# Patient Record
Sex: Female | Born: 1985
Health system: Southern US, Community
[De-identification: ages and names within clinical notes are randomized; demographics above are authoritative.]

## PROBLEM LIST (undated history)

## (undated) DIAGNOSIS — T7840XA Allergy, unspecified, initial encounter: Secondary | ICD-10-CM

## (undated) DIAGNOSIS — F419 Anxiety disorder, unspecified: Secondary | ICD-10-CM

## (undated) DIAGNOSIS — F32A Depression, unspecified: Secondary | ICD-10-CM

## (undated) DIAGNOSIS — R51 Headache: Secondary | ICD-10-CM

## (undated) DIAGNOSIS — E559 Vitamin D deficiency, unspecified: Secondary | ICD-10-CM

## (undated) DIAGNOSIS — R519 Headache, unspecified: Secondary | ICD-10-CM

## (undated) DIAGNOSIS — M722 Plantar fascial fibromatosis: Secondary | ICD-10-CM

## (undated) DIAGNOSIS — Z9109 Other allergy status, other than to drugs and biological substances: Secondary | ICD-10-CM

## (undated) DIAGNOSIS — D249 Benign neoplasm of unspecified breast: Secondary | ICD-10-CM

## (undated) DIAGNOSIS — F329 Major depressive disorder, single episode, unspecified: Secondary | ICD-10-CM

## (undated) DIAGNOSIS — F909 Attention-deficit hyperactivity disorder, unspecified type: Secondary | ICD-10-CM

## (undated) HISTORY — DX: Headache: R51

## (undated) HISTORY — DX: Allergy, unspecified, initial encounter: T78.40XA

## (undated) HISTORY — PX: BUNIONECTOMY: SHX129

## (undated) HISTORY — DX: Plantar fascial fibromatosis: M72.2

## (undated) HISTORY — DX: Anxiety disorder, unspecified: F41.9

## (undated) HISTORY — PX: OTHER SURGICAL HISTORY: SHX169

## (undated) HISTORY — PX: WISDOM TOOTH EXTRACTION: SHX21

## (undated) HISTORY — DX: Benign neoplasm of unspecified breast: D24.9

## (undated) HISTORY — DX: Other allergy status, other than to drugs and biological substances: Z91.09

## (undated) HISTORY — DX: Attention-deficit hyperactivity disorder, unspecified type: F90.9

## (undated) HISTORY — DX: Major depressive disorder, single episode, unspecified: F32.9

## (undated) HISTORY — DX: Headache, unspecified: R51.9

## (undated) HISTORY — DX: Vitamin D deficiency, unspecified: E55.9

## (undated) HISTORY — PX: FOOT SURGERY: SHX648

## (undated) HISTORY — PX: TONSILLECTOMY: SUR1361

## (undated) HISTORY — DX: Depression, unspecified: F32.A

---
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
## 2004-01-21 ENCOUNTER — Other Ambulatory Visit: Admission: RE | Admit: 2004-01-21 | Discharge: 2004-01-21 | Payer: Self-pay | Admitting: *Deleted

## 2010-07-01 ENCOUNTER — Other Ambulatory Visit: Payer: Self-pay

## 2011-03-16 ENCOUNTER — Ambulatory Visit: Payer: Managed Care, Other (non HMO) | Admitting: Internal Medicine

## 2011-03-16 VITALS — BP 119/76 | HR 79 | Temp 98.6°F | Resp 16 | Ht 66.25 in | Wt 188.4 lb

## 2011-03-16 DIAGNOSIS — M722 Plantar fascial fibromatosis: Secondary | ICD-10-CM

## 2011-03-16 DIAGNOSIS — J029 Acute pharyngitis, unspecified: Secondary | ICD-10-CM

## 2011-03-16 DIAGNOSIS — R05 Cough: Secondary | ICD-10-CM

## 2011-03-16 DIAGNOSIS — J40 Bronchitis, not specified as acute or chronic: Secondary | ICD-10-CM

## 2011-03-16 DIAGNOSIS — R059 Cough, unspecified: Secondary | ICD-10-CM

## 2011-03-16 MED ORDER — AZITHROMYCIN 500 MG PO TABS: 500.0000 mg | ORAL_TABLET | Freq: Every day | ORAL | Status: AC

## 2011-03-16 NOTE — Progress Notes (Signed)
  Subjective:    Patient ID: Summer Shields, female    DOB: 08-23-1985, 26 y.o.   MRN: [id number]  HPI Pain in the arch of the right foot for 2 week/ achy with ambulation/. No trauma/pain 5/10 in severity Also cough sosre throat for 3 days. No temp had fatigue..   Review of Systems  Constitutional: Positive for fatigue.  HENT: Positive for sore throat.   Eyes: Negative.   Respiratory: Positive for cough.   Cardiovascular: Negative.   Gastrointestinal: Negative.   Genitourinary: Negative.   Musculoskeletal: Positive for gait problem.       Foot pain r sole of foot in the arch  Neurological: Negative.   Hematological: Negative.   Psychiatric/Behavioral: Negative.   All other systems reviewed and are negative.       Objective:   Physical Exam  Nursing note and vitals reviewed. Constitutional: She is oriented to person, place, and time. She appears well-developed and well-nourished.  HENT:  Head: Normocephalic and atraumatic.  Right Ear: External ear normal.  Left Ear: External ear normal.  Eyes: Conjunctivae and EOM are normal. Pupils are equal, round, and reactive to light.  Neck: Normal range of motion. Neck supple.  Cardiovascular: Normal rate, regular rhythm and normal heart sounds.   Pulmonary/Chest:       Cough coarse breath sounds  Abdominal: Soft. Bowel sounds are normal.  Musculoskeletal: She exhibits tenderness.       r foot arch of foot tender to touch no pain over the metatarsals  Neurological: She is alert and oriented to person, place, and time.  Skin: Skin is warm and dry.  Psychiatric: She has a normal mood and affect. Her behavior is normal. Judgment and thought content normal.          Assessment & Plan:  bronchitis pharyngitis, right foot pain /plantar fasciitis suspected.

## 2011-03-16 NOTE — Patient Instructions (Signed)
zithromax as directed. Ibuprofen as directed. Stretching exercises. Arch support. Cold compresses to sole of the foot.

## 2011-03-17 ENCOUNTER — Other Ambulatory Visit: Payer: Self-pay | Admitting: Physician Assistant

## 2011-03-17 MED ORDER — NORGESTIMATE-ETH ESTRADIOL 0.25-35 MG-MCG PO TABS: 1.0000 | ORAL_TABLET | Freq: Every day | ORAL | Status: DC

## 2011-04-11 ENCOUNTER — Ambulatory Visit: Payer: Managed Care, Other (non HMO)

## 2011-04-11 ENCOUNTER — Ambulatory Visit (INDEPENDENT_AMBULATORY_CARE_PROVIDER_SITE_OTHER): Payer: Managed Care, Other (non HMO) | Admitting: Internal Medicine

## 2011-04-11 VITALS — BP 104/70 | HR 90 | Temp 98.5°F | Resp 16 | Ht 66.25 in | Wt 190.0 lb

## 2011-04-11 DIAGNOSIS — M722 Plantar fascial fibromatosis: Secondary | ICD-10-CM

## 2011-04-11 DIAGNOSIS — Z3200 Encounter for pregnancy test, result unknown: Secondary | ICD-10-CM

## 2011-04-11 MED ORDER — MELOXICAM 15 MG PO TABS: 15.0000 mg | ORAL_TABLET | Freq: Every day | ORAL | Status: DC

## 2011-04-11 NOTE — Progress Notes (Signed)
  Subjective:    Patient ID: Summer Shields, female    DOB: Aug 01, 1985, 27 y.o.   MRN: [id number]  HPIIncreasing pain on the plantar aspect of the right foot over the past 3 months/it began as she was walking during her lunch break through months ago and has steadily gotten worse/C. Last visit-conservative measures have not helped She has some early morning pain but is actually worse the longer she is on her feet/there's no swelling and there's been no injury    Review of Systems     Objective:   Physical Exam Vital signs are stable She is exquisitely tender in the plantar Fascia adjacent to the heel in the midline Achilles nontender and not swollen She has full  dorsiflexion without pain The ankle is stable to stress      [facility] reading (PRIMARY) by  Dr. Merla Riches: Heel is normal. Foot is normal.    Assessment & Plan:  Problem #1 Plantar fasciitis Because the position of the discomfort and her symptoms of not having first morning pain aren't consistent with this diagnosis completely, hour for her to the [facility] so they can ultrasound this area and rule out a tear in the tendon and perhaps consider nitroglycerin as a treatment In the meantime she will continue her running shoes plus orthotics prescribed by Dr. Mindi Junker and I'll add Mobic daily plus ice daily

## 2011-04-20 ENCOUNTER — Ambulatory Visit (INDEPENDENT_AMBULATORY_CARE_PROVIDER_SITE_OTHER): Payer: Self-pay | Admitting: Family Medicine

## 2011-04-20 VITALS — BP 116/80 | Ht 67.0 in | Wt 190.0 lb

## 2011-04-20 DIAGNOSIS — M722 Plantar fascial fibromatosis: Secondary | ICD-10-CM | POA: Insufficient documentation

## 2011-04-20 NOTE — Patient Instructions (Signed)
Plantar fasciitis patient education handout given with home exercises.

## 2011-04-20 NOTE — Assessment & Plan Note (Addendum)
Will do a trial of sports insoles with scaphoid support +/- arch straps.  She has the arch straps.  She will try the insoles with and without and choose based on comfort.  She will continue the meloxicam plus ice and stretching.

## 2011-04-20 NOTE — Progress Notes (Signed)
  Subjective:    Patient ID: Summer Shields, female    DOB: November 20, 1985, 26 y.o.   MRN: [id number]  HPI 26 y/o female is here with right heel pain x3 months that is thought to be plantar fasciitis.  However, she was referred to [facility] for ultrasound because her symptom history is atypical.  She has more pain with prolonged walking as opposed to early am.  There is no history of trauma.  She has never had this problem in the past.  She has tried OTC inserts without improvement.  She was prescribed mobic which has helped some.     Review of Systems     Objective:   Physical Exam  Feet: Some loss of the longitudinal arch, more on the unaffected foot Bilateral loss of the transverse arch with splaying between the 1st and 2nd digits Tender to palpation at the insertion of the plantar fascia bilateral, right greater left No swelling or erythema  Posterior tib function is normal  Ultrasound: The plantar fascia measures 0.44cm, it appears intact.  There is a small amount of spurring noted on the calcaneus.       Assessment & Plan:

## 2011-05-18 ENCOUNTER — Encounter: Payer: Self-pay | Admitting: Family Medicine

## 2011-05-18 ENCOUNTER — Ambulatory Visit (INDEPENDENT_AMBULATORY_CARE_PROVIDER_SITE_OTHER): Payer: Managed Care, Other (non HMO) | Admitting: Family Medicine

## 2011-05-18 VITALS — BP 127/83 | HR 81

## 2011-05-18 DIAGNOSIS — M722 Plantar fascial fibromatosis: Secondary | ICD-10-CM

## 2011-05-18 NOTE — Progress Notes (Signed)
  Subjective:    Patient ID: Summer Shields, female    DOB: May 09, 1985, 26 y.o.   MRN: [id number]  HPI 26 y/o female is here for follow up for plantar fasciitis.  She was improving up until she had too spend a longer time than usual on her feet because she is moving.  The following day she had a return of her morning pain.  She is getting good results from the arch strap plus insoles with scaphoid support.  She wears "strutz" which are commercial arch straps with scaphoid support attached while she is barefoot.  She finds this helps her pain while barefoot.   Review of Systems     Objective:   Physical Exam  Non tender to palpation at the insertion of the plantar fascia bilat Mild tenderness to palpation along the right arch, left is non-tender The remainder of the foot is non-tender to palpation No swelling or erythema      Assessment & Plan:

## 2011-05-18 NOTE — Assessment & Plan Note (Signed)
She is getting improvement with conservative therapy so we will continue in this manner.  If she continues to improve over the next 6 weeks she does not need to follow up.  However, if she does not see continued gradual improvement she will follow up to discuss other treatment options.  Since the insoles have helped I have given her a catalogue to order them from home.  She will do this in the case that she improves and doesn't need follow up.

## 2011-08-31 ENCOUNTER — Other Ambulatory Visit: Payer: Self-pay | Admitting: *Deleted

## 2011-08-31 MED ORDER — FLUTICASONE PROPIONATE 50 MCG/ACT NA SUSP: 2.0000 | Freq: Every day | NASAL | Status: DC

## 2011-11-02 ENCOUNTER — Other Ambulatory Visit: Payer: Self-pay | Admitting: Physician Assistant

## 2011-11-02 NOTE — Telephone Encounter (Signed)
Chart pulled to PA pool at nurses station DOS 06/28/10

## 2011-11-02 NOTE — Telephone Encounter (Signed)
Please pull chart.

## 2011-11-28 ENCOUNTER — Ambulatory Visit (INDEPENDENT_AMBULATORY_CARE_PROVIDER_SITE_OTHER): Payer: Managed Care, Other (non HMO) | Admitting: Physician Assistant

## 2011-11-28 ENCOUNTER — Encounter: Payer: Self-pay | Admitting: Physician Assistant

## 2011-11-28 VITALS — BP 126/82 | HR 105 | Temp 98.5°F | Resp 17 | Ht 66.0 in | Wt 177.0 lb

## 2011-11-28 DIAGNOSIS — Z0000 Encounter for general adult medical examination without abnormal findings: Secondary | ICD-10-CM

## 2011-11-28 DIAGNOSIS — J309 Allergic rhinitis, unspecified: Secondary | ICD-10-CM

## 2011-11-28 DIAGNOSIS — D249 Benign neoplasm of unspecified breast: Secondary | ICD-10-CM

## 2011-11-28 LAB — POCT URINALYSIS DIPSTICK
Blood, UA: NEGATIVE
Glucose, UA: NEGATIVE
Nitrite, UA: NEGATIVE
Urobilinogen, UA: 0.2
pH, UA: 5

## 2011-11-28 LAB — POCT UA - MICROSCOPIC ONLY
Casts, Ur, LPF, POC: NEGATIVE
Crystals, Ur, HPF, POC: NEGATIVE

## 2011-11-28 LAB — CBC
Platelets: 185 10*3/uL (ref 150–400)
RBC: 4.62 MIL/uL (ref 3.87–5.11)
WBC: 6 10*3/uL (ref 4.0–10.5)

## 2011-11-28 MED ORDER — NORGESTIMATE-ETH ESTRADIOL 0.25-35 MG-MCG PO TABS: 1.0000 | ORAL_TABLET | Freq: Every day | ORAL | Status: DC

## 2011-11-28 MED ORDER — FLUTICASONE PROPIONATE 50 MCG/ACT NA SUSP: 2.0000 | Freq: Every day | NASAL | Status: DC

## 2011-11-28 NOTE — Progress Notes (Addendum)
Patient ID: Summer Shields MRN: [id number], DOB: 01-May-1985, 26 y.o. Date of Encounter: 11/28/2011, 3:20 PM  Primary Physician: Pcp Not In System  Chief Complaint: Medication refill  HPI: 26 y.o. y/o female with history of noted below here for CPE. Doing well. Scheduled medication refill, patient requests CPE. Last CPE June 28, 2010. Encounter inadvertently closed. Patient generally healthy. No issues with plantar fasciitis of the right foot. Occasionally has mild discomfort. Does exercises recommended by orthopedic. Wearing brace. Not requiring medications. Allergies well controlled with Flonase. Requests refill. Doing well with Sprintec. Sexually active with one female. No new partners.   She has not gone to have follow up ultrasound of her right breast. States she was told "byt he other doc" that it did not need follow up.   LMP: 11/04/2011 Pap: 1.5 years ago and normal   Review of Systems: Consitutional: No fever, chills, fatigue, night sweats, lymphadenopathy, or weight changes. Eyes: No visual changes, eye redness, or discharge. ENT/Mouth: Ears: No otalgia, tinnitus, hearing loss, discharge. Nose: No congestion, rhinorrhea, sinus pain, or epistaxis. Throat: No sore throat, post nasal drip, or teeth pain. Cardiovascular: No CP, palpitations, diaphoresis, DOE, edema, orthopnea, PND. Respiratory: No cough, hemoptysis, SOB, or wheezing. Gastrointestinal: No anorexia, dysphagia, reflux, pain, nausea, vomiting, hematemesis, diarrhea, constipation, BRBPR, or melena. Breast: No discharge, pain, swelling, or mass. Genitourinary: No dysuria, frequency, urgency, hematuria, incontinence, nocturia, amenorrhea, vaginal discharge, pruritis, burning, abnormal bleeding, or pain. Musculoskeletal: No decreased ROM, myalgias, stiffness, joint swelling, or weakness. Skin: No rash, erythema, lesion changes, pain, warmth, jaundice, or pruritis. Neurological: No headache, dizziness, syncope, seizures,  tremors, memory loss, coordination problems, or paresthesias. Psychological: No anxiety, depression, hallucinations, SI/HI. Endocrine: No fatigue, polydipsia, polyphagia, polyuria, or known diabetes. All other systems were reviewed and are otherwise negative.  History reviewed. No pertinent past medical history.   History reviewed. No pertinent past surgical history.  Home Meds:  Prior to Admission medications   Medication Sig Start Date End Date Taking? Authorizing Provider  fluticasone (FLONASE) 50 MCG/ACT nasal spray Place 2 sprays into the nose daily. 11/28/11  Yes Valentine Barney M Adalyn Pennock, PA-C  norgestimate-ethinyl estradiol (SPRINTEC 28) 0.25-35 MG-MCG tablet Take 1 tablet by mouth daily. 11/28/11  Yes Wing Schoch M Rayven Hendrickson, PA-C  meloxicam (MOBIC) 15 MG tablet Take 1 tablet (15 mg total) by mouth daily. 04/11/11 04/10/12  Tonye Pearson, MD    Allergies:  Allergies  Allergen Reactions  . Amoxicillin   . Codeine     History   Social History  . Marital Status: Single    Spouse Name: N/A    Number of Children: N/A  . Years of Education: N/A   Occupational History  . Not on file.   Social History Main Topics  . Smoking status: Never Smoker   . Smokeless tobacco: Not on file  . Alcohol Use: Not on file  . Drug Use: Not on file  . Sexually Active: Not on file   Other Topics Concern  . Not on file   Social History Narrative  . No narrative on file    History reviewed. No pertinent family history.  Physical Exam: Blood pressure 126/82, pulse 105, temperature 98.5 F (36.9 C), temperature source Oral, resp. rate 17, height 5\' 6"  (1.676 m), weight 177 lb (80.287 kg), last menstrual period 11/04/2011, SpO2 97.00%., Body mass index is 28.57 kg/(m^2). General: Well developed, well nourished, in no acute distress. HEENT: Normocephalic, atraumatic. Conjunctiva pink, sclera non-icteric. Pupils 2 mm constricting to 1  mm, round, regular, and equally reactive to light and accomodation. EOMI.  Internal auditory canal clear. TMs with good cone of light and without pathology. Nasal mucosa pink. Nares are without discharge. No sinus tenderness. Oral mucosa pink. Dentition normal. Pharynx without exudate.   Neck: Supple. Trachea midline. No thyromegaly. Full ROM. No lymphadenopathy. Lungs: Clear to auscultation bilaterally without wheezes, rales, or rhonchi. Breathing is of normal effort and unlabored. Cardiovascular: RRR with S1 S2. No murmurs, rubs, or gallops appreciated. Distal pulses 2+ symmetrically. No carotid or abdominal bruits. Abdomen: Soft, non-tender, non-distended with normoactive bowel sounds. No hepatosplenomegaly or masses. No rebound/guarding. No CVA tenderness. Without hernias.  Genitourinary:  External genitalia without lesions. Vaginal mucosa pink. Cervix pink and without discharge. No cervical or adnexal tenderness. Pap smear taken. Aram Beecham in room for entire pelvic exam.  Musculoskeletal: Full range of motion and 5/5 strength throughout. Without swelling, atrophy, tenderness, crepitus, or warmth. Extremities without clubbing, cyanosis, or edema. Calves supple. Skin: Warm and moist without erythema, ecchymosis, wounds, or rash. Neuro: A+Ox3. CN II-XII grossly intact. Moves all extremities spontaneously. Full sensation throughout. Normal gait. DTR 2+ throughout upper and lower extremities. Finger to nose intact. Psych:  Responds to questions appropriately with a normal affect.   Studies:  Results for orders placed in visit on 11/28/11  POCT URINALYSIS DIPSTICK      Component Value Range   Color, UA yellow     Clarity, UA clear     Glucose, UA neg     Bilirubin, UA neg     Ketones, UA neg     Spec Grav, UA 1.025     Blood, UA neg     pH, UA 5.0     Protein, UA neg     Urobilinogen, UA 0.2     Nitrite, UA neg     Leukocytes, UA Negative    POCT UA - MICROSCOPIC ONLY      Component Value Range   WBC, Ur, HPF, POC 0-5     RBC, urine, microscopic neg      Bacteria, U Microscopic trace     Mucus, UA large     Epithelial cells, urine per micros 2-14     Crystals, Ur, HPF, POC neg     Casts, Ur, LPF, POC neg     Yeast, UA neg      CBC, CMET, Lipid, TSH, and Pap 3 all pending. Patient is fasting.   Assessment/Plan:  26 y.o. y/o female here for CPE, medication refill, and form completion with well healing plantar fascitis, allergic rhinitis, and right breast fibroadenoma.  1. CPE -Healthy young adult female physical -Healthy diet, exercise, and weight loss -Await labs, follow pending -Sprintec 1 po daily #28 RF 11 -Form completed, pending total cholesterol  2. Allergic rhinitis -Well controlled with current treatment -Refilled Flonase 2 sprays each nare daily #1 RF 6 -May refill through 11/2012  3. Plantar fascitis -Responding well to conservative therapy -Not requiring NSAIDs -No issues currently  4. Right breast fibroadenoma -Referral for follow up right breast ultrasound    Signed, Eula Listen, PA-C 11/28/2011 3:20 PM

## 2011-11-28 NOTE — Addendum Note (Signed)
Addended by: Sondra Barges on: 11/28/2011 06:37 PM   Modules accepted: Orders, Level of Service

## 2011-11-29 LAB — COMPREHENSIVE METABOLIC PANEL
ALT: 13 U/L (ref 0–35)
CO2: 22 mEq/L (ref 19–32)
Calcium: 9.3 mg/dL (ref 8.4–10.5)
Chloride: 108 mEq/L (ref 96–112)
Potassium: 4 mEq/L (ref 3.5–5.3)
Sodium: 139 mEq/L (ref 135–145)
Total Protein: 6.6 g/dL (ref 6.0–8.3)

## 2011-11-29 LAB — LIPID PANEL
Cholesterol: 165 mg/dL (ref 0–200)
VLDL: 15 mg/dL (ref 0–40)

## 2011-11-30 LAB — PAP IG, CT-NG, RFX HPV ASCU: Chlamydia Probe Amp: NEGATIVE

## 2011-12-07 ENCOUNTER — Ambulatory Visit: Payer: Managed Care, Other (non HMO)

## 2012-01-26 ENCOUNTER — Telehealth: Payer: Self-pay

## 2012-01-26 NOTE — Telephone Encounter (Signed)
LAST SEEN IN DEC BY RYAN DUNN, CALLING IN REFERENCE TO A FORM THAT WAS TO BE FILLED OUT   CALLBACK#- [phone]

## 2012-01-27 NOTE — Telephone Encounter (Signed)
I do not have the form, nor does Ryan. I have asked her to fax to me and I will call her when it is ready.

## 2012-01-30 ENCOUNTER — Ambulatory Visit: Payer: Managed Care, Other (non HMO)

## 2012-01-30 ENCOUNTER — Ambulatory Visit (INDEPENDENT_AMBULATORY_CARE_PROVIDER_SITE_OTHER): Payer: Managed Care, Other (non HMO) | Admitting: Family Medicine

## 2012-01-30 VITALS — BP 116/76 | HR 108 | Temp 98.3°F | Resp 17 | Ht 66.5 in | Wt 173.0 lb

## 2012-01-30 DIAGNOSIS — M79673 Pain in unspecified foot: Secondary | ICD-10-CM

## 2012-01-30 DIAGNOSIS — S96919A Strain of unspecified muscle and tendon at ankle and foot level, unspecified foot, initial encounter: Secondary | ICD-10-CM

## 2012-01-30 DIAGNOSIS — F411 Generalized anxiety disorder: Secondary | ICD-10-CM

## 2012-01-30 DIAGNOSIS — S93609A Unspecified sprain of unspecified foot, initial encounter: Secondary | ICD-10-CM

## 2012-01-30 DIAGNOSIS — M79609 Pain in unspecified limb: Secondary | ICD-10-CM

## 2012-01-30 DIAGNOSIS — F419 Anxiety disorder, unspecified: Secondary | ICD-10-CM

## 2012-01-30 MED ORDER — PREDNISONE 20 MG PO TABS: ORAL_TABLET | ORAL | Status: DC

## 2012-01-30 NOTE — Progress Notes (Signed)
Patient ID: Summer Shields MRN: [id number], DOB: 1985-08-17, 27 y.o. Date of Encounter: 01/30/2012, 3:25 PM  Primary Physician: Pcp Not In System  Chief Complaint: Left foot pain for 3 days  HPI: 27 y.o. year old female with history below presents with left foot pain for 3 days. Pain is located along 1st metatarsal. No injury or trauma. She notes if she sits still the pain improves, but with movement the pain returns. Pain does not radiate proximally or distally. She has not noticed any swelling, erythema, or bruising. No wounds. This does not feel like her plantar fasciitis of the contralateral foot. She did try wearing the strap she has for her plantar fasciitis on this left foot, however this seemed to make the pain worse. Afebrile. She is leaving to go on a cruise the following week and just wanted to come in and get this checked out before her trip.   She also mentions increased stress and anxiety at work and with a roommate. She has a decent local support system, but would prefer to have counselor to talk to. She is handling the stress quite well, but has had a couple of episodes were the stress has just gotten to her and has caused some anxiety. She has talked to her dad about this, and this has helped; however, he is not local. She would prefer to stay away from medications at this time. No depression, SI, or HI.   Past Medical History  Diagnosis Date  . Plantar fasciitis   . Fibroadenoma      Home Meds: Prior to Admission medications   Medication Sig Start Date End Date Taking? Authorizing Provider  fluticasone (FLONASE) 50 MCG/ACT nasal spray Place 2 sprays into the nose daily. 11/28/11  Yes Yuval Rubens M Cheron Pasquarelli, PA-C  norgestimate-ethinyl estradiol (SPRINTEC 28) 0.25-35 MG-MCG tablet Take 1 tablet by mouth daily. 11/28/11  Yes Zacharias Ridling M Linnaea Ahn, PA-C  meloxicam (MOBIC) 15 MG tablet Take 1 tablet (15 mg total) by mouth daily. 04/11/11 04/10/12 No Tonye Pearson, MD    Allergies:  Allergies    Allergen Reactions  . Amoxicillin   . Codeine     History   Social History  . Marital Status: Single    Spouse Name: N/A    Number of Children: N/A  . Years of Education: N/A   Occupational History  . Not on file.   Social History Main Topics  . Smoking status: Never Smoker   . Smokeless tobacco: Not on file  . Alcohol Use: No  . Drug Use: No  . Sexually Active: Yes    Birth Control/ Protection: None   Other Topics Concern  . Not on file   Social History Narrative  . No narrative on file     Review of Systems: Constitutional: negative for chills, fever, night sweats, weight changes, or fatigue  Cardiovascular: negative for chest pain or palpitations Respiratory: negative for hemoptysis, wheezing, shortness of breath, or cough Dermatological: negative for rash, wound, or lesion Neurologic: negative for headache, dizziness, or syncope Psych: positive for anxiety. Negative for depression, SI, or HI All other systems reviewed and are otherwise negative with the exception to those above and in the HPI.   Physical Exam: Blood pressure 116/76, pulse 108, temperature 98.3 F (36.8 C), temperature source Oral, resp. rate 17, height 5' 6.5" (1.689 m), weight 173 lb (78.472 kg), last menstrual period 01/24/2012, SpO2 98.00%., Body mass index is 27.50 kg/(m^2). General: Well developed, well nourished, in no  acute distress. Head: Normocephalic, atraumatic, eyes without discharge, sclera non-icteric, nares are without discharge.   Neck: Supple.  Lungs: Breathing is unlabored. Heart: Regular rate. Msk:  Strength and tone normal for age. Extremities/Skin: Left foot: No STS, erythema, or ecchymosis. FROM throughout foot without pain, ankle, and digits. TTP distal aspect of 1st MTP, otherwise no TTP. Achilles nontender. Thompson squeeze creates movement of foot. Exam otherwise unremarkable.  Neuro: Alert and oriented X 3. Moves all extremities spontaneously. Gait is normal.  CNII-XII grossly in tact. Psych:  Responds to questions appropriately with a normal affect.   [facility] reading (PRIMARY) by  Dr. Patsy Lager. Negative   ASSESSMENT AND PLAN:  27 y.o. year old female with left foot and increased stress/anxiety 1) Foot strain/pain -Prednisone 20 mg #18 3x3, 2x3, 1x3 no RF -RICE -Call with update upon completion of prednisone and when she returns from her cruise  2) Increased stress/anxiety -Stressors related to work and roommate  -Careers adviser -Referral made for counselor  -Offered her our assistance if anyway  -RTC precautions   Signed, Eula Listen, PA-C 01/30/2012 3:25 PM

## 2012-02-17 ENCOUNTER — Telehealth: Payer: Self-pay

## 2012-02-17 NOTE — Telephone Encounter (Signed)
Summer Shields, Summer Shields is following up after completion of her medication.   [phone]

## 2012-02-17 NOTE — Telephone Encounter (Signed)
Have her come back in to see me.

## 2012-02-17 NOTE — Telephone Encounter (Signed)
She took prednisone for foot, it helped some but now she has some pain around the arch of her foot. Please advise, what is the next step in plan of care?

## 2012-02-17 NOTE — Telephone Encounter (Signed)
Left message for her to call me back, so I can get more information.

## 2012-02-18 NOTE — Telephone Encounter (Signed)
LMOM to RTC. 

## 2012-03-19 ENCOUNTER — Encounter: Payer: Self-pay | Admitting: Physician Assistant

## 2012-03-19 ENCOUNTER — Ambulatory Visit: Payer: Managed Care, Other (non HMO) | Admitting: Physician Assistant

## 2012-03-19 VITALS — BP 90/62 | HR 81 | Temp 98.3°F | Resp 16 | Ht 66.0 in | Wt 171.4 lb

## 2012-03-19 DIAGNOSIS — M25579 Pain in unspecified ankle and joints of unspecified foot: Secondary | ICD-10-CM

## 2012-03-19 DIAGNOSIS — M722 Plantar fascial fibromatosis: Secondary | ICD-10-CM

## 2012-03-19 MED ORDER — TRAMADOL HCL 50 MG PO TABS: 50.0000 mg | ORAL_TABLET | Freq: Three times a day (TID) | ORAL | Status: DC | PRN

## 2012-03-19 NOTE — Progress Notes (Signed)
Patient ID: Summer Shields MRN: [id number], DOB: 11-10-1985, 27 y.o. Date of Encounter: 03/19/2012, 4:07 PM  Primary Physician: Pcp Not In System  Chief Complaint: Follow up foot pain   HPI: 27 y.o. year old female with history below presents for follow up of left foot pain. Since her visit on 01/30/12 she states that her pain did improve while taking the prednisone, however it did begin to return after completing the taper. She did go on a [place] cruise in early February without difficulty, however she notes that her left foot began giving her some difficulty the following weekend upon her return to the [place]. She states that wearing her plantar fasciitis strap only exacerbates the pain. Pain is located along the dorsal surface of the 1st MTP and along the plantar surface of the 1st digit. She also states that she is having pain along the bases of digits 3, 4, and 5 of the left foot now. No injury or trauma. Never with swelling, erythema, or bruising. Of note, she now states that she cannot wear her plantar fasciitis strap on her right foot because of pain along the dorsal surface of the 1st MTP. This too is without injury or trauma.    Past Medical History  Diagnosis Date  . Plantar fasciitis   . Fibroadenoma      Home Meds: Prior to Admission medications   Medication Sig Start Date End Date Taking? Authorizing Provider  fluticasone (FLONASE) 50 MCG/ACT nasal spray Place 2 sprays into the nose daily. 11/28/11  Yes Ryan M Dunn, PA-C  norgestimate-ethinyl estradiol (SPRINTEC 28) 0.25-35 MG-MCG tablet Take 1 tablet by mouth daily. 11/28/11  Yes Ryan Adria Devon, PA-C           Allergies:  Allergies  Allergen Reactions  . Amoxicillin   . Codeine     History   Social History  . Marital Status: Single    Spouse Name: N/A    Number of Children: N/A  . Years of Education: N/A   Occupational History  . Not on file.   Social History Main Topics  . Smoking status: Never Smoker   .  Smokeless tobacco: Not on file  . Alcohol Use: No  . Drug Use: No  . Sexually Active: Yes    Birth Control/ Protection: None   Other Topics Concern  . Not on file   Social History Narrative  . No narrative on file     Review of Systems: Constitutional: negative for chills, fever, night sweats, weight changes, or fatigue  Dermatological: negative for rash, erythema, lesions, wounds, or swelling Neurologic: negative for paresthesias   Physical Exam: Blood pressure 90/62, pulse 81, temperature 98.3 F (36.8 C), temperature source Oral, resp. rate 16, height 5\' 6"  (1.676 m), weight 171 lb 6.4 oz (77.747 kg), last menstrual period 03/18/2012, SpO2 97.00%., Body mass index is 27.68 kg/(m^2). General: Well developed, well nourished, in no acute distress. Head: Normocephalic, atraumatic, eyes without discharge, sclera non-icteric, nares are without discharge.   Neck: Supple. Full ROM. Lungs: Breathing is unlabored. Heart: Regular rate. Msk:  Strength and tone normal for age. Extremities/Skin: Bilateral feet with TTP along the plantar surface of the 1st and 2nd digits and along the dorsal 1st MTP, otherwise without TTP. FROM throughout. No erythema, ecchymosis, STS, lesions, or wounds. No TTP along the Achilles or posterior tibia.  Neuro: Alert and oriented X 3. Moves all extremities spontaneously. Gait is normal. CNII-XII grossly in tact. Psych:  Responds to  questions appropriately with a normal affect.   Labs: WSER and RF both pending  Patient with unremarkable plain film of the left foot on 01/30/12 and unremarkable plain film of the right foot on 04/11/11.  ASSESSMENT AND PLAN:  27 y.o. female with likely plantar fasciitis. -Unfortunately she has failed more conservative therapy and possibly will need more specialist directed therapies. We will manage her discomfort at this time until she can get in to be seen by her orthopedist.  -Ultram 50 mg 1 po tid prn pain #30 RF 1 -She is  already established with Dr. Laural Benes, call them for an appointment -Follow up prn   Signed, Eula Listen, PA-C 03/19/2012 4:07 PM

## 2012-03-19 NOTE — Patient Instructions (Addendum)

## 2012-03-20 LAB — RHEUMATOID FACTOR: Rhuematoid fact SerPl-aCnc: <10 IU/mL (ref <=14)

## 2012-03-21 ENCOUNTER — Telehealth: Payer: Self-pay

## 2012-03-21 NOTE — Telephone Encounter (Signed)
Pt states tramadol is causing vomiting,asking for change in rx   Best phone [phone]  [facility] [address]

## 2012-03-22 MED ORDER — NAPROXEN 500 MG PO TABS: 500.0000 mg | ORAL_TABLET | Freq: Two times a day (BID) | ORAL | Status: DC

## 2012-03-22 NOTE — Telephone Encounter (Signed)
Medication changed

## 2012-03-22 NOTE — Telephone Encounter (Signed)
Please advise patient vomiting after taking tramadol can we change this for her?

## 2012-03-22 NOTE — Telephone Encounter (Signed)
Patient advised.

## 2012-04-03 ENCOUNTER — Telehealth: Payer: Self-pay

## 2012-04-03 MED ORDER — ALPRAZOLAM 0.5 MG PO TABS: 0.5000 mg | ORAL_TABLET | Freq: Two times a day (BID) | ORAL | Status: DC | PRN

## 2012-04-03 NOTE — Telephone Encounter (Signed)
Spoke with patient. She has been seeing her therapist every 2 weeks and this has been going well. However sometimes when she sees her she does not have much to say as things are going well. Then the next day things become stressful with her roommates, work, or family and she feels like she is having panic attacks. She is requesting to have something on hand for these times. She also plans to tell her roommates this upcoming weekend that she will be moving out this July and she is not sure how they will take this. She is worried this may create more stress on the living situation, thus she would like to have something in place to help.   Patient does have an appointment next Wednesday 04/11/12 to see her orthopedist about her foot.

## 2012-04-03 NOTE — Telephone Encounter (Signed)
I have no record of this, seems like she needs OV to discuss, please advise.

## 2012-04-03 NOTE — Telephone Encounter (Signed)
Pt states she discussed anxiety drug with ryan dunn last visit,would like to pursue that   Best [phone]   [facility] [address]

## 2012-04-11 ENCOUNTER — Encounter: Payer: Self-pay | Admitting: Family Medicine

## 2012-04-11 ENCOUNTER — Ambulatory Visit (INDEPENDENT_AMBULATORY_CARE_PROVIDER_SITE_OTHER): Payer: Managed Care, Other (non HMO) | Admitting: Family Medicine

## 2012-04-11 VITALS — BP 107/74 | HR 81 | Ht 67.0 in | Wt 167.0 lb

## 2012-04-11 DIAGNOSIS — M7750 Other enthesopathy of unspecified foot: Secondary | ICD-10-CM

## 2012-04-11 DIAGNOSIS — M7742 Metatarsalgia, left foot: Secondary | ICD-10-CM

## 2012-04-11 DIAGNOSIS — M722 Plantar fascial fibromatosis: Secondary | ICD-10-CM

## 2012-04-11 DIAGNOSIS — R269 Unspecified abnormalities of gait and mobility: Secondary | ICD-10-CM

## 2012-04-11 DIAGNOSIS — M7741 Metatarsalgia, right foot: Secondary | ICD-10-CM | POA: Insufficient documentation

## 2012-04-11 NOTE — Progress Notes (Signed)
Summer Shields is a 27 y.o. female who presents to [facility] today for followup bilateral foot pain.  She was last seen in May 2013 for bilateral plantar fasciitis. She was treated with sports and soles with scaphoid pads. These worked well however have worn out. Over the last several months her plantar fasciitis pain is worsening. She notes pain at the plantar medial calcaneus bilaterally. Additionally she notes bilateral second and third metatarsal head pain. She notes her pain is worse with activity and better with rest. She denies any radiating pain weakness or numbness. She walks recreationally about 15 miles per week. She's currently wearing over-the-counter insoles which are somewhat helpful.    PMH reviewed.  History  Substance Use Topics  . Smoking status: Never Smoker   . Smokeless tobacco: Not on file  . Alcohol Use: No   ROS as above otherwise neg   Exam:  BP 107/74  Pulse 81  Ht 5\' 7"  (1.702 m)  Wt 167 lb (75.751 kg)  BMI 26.15 kg/m2  LMP 03/18/2012 Gen: Well NAD MSK: None standing foot exam bilaterally shows cavus appearing foot. Standing with significant longitudinal arch collapse and pronation. She has callus formation overlying the second and third metatarsal heads bilaterally. Additionally her second third and first metatarsal heads are somewhat tender to palpation.  She has normal foot motion and strength. Additionally she has normal pulses capillary refill and sensation.   Gait: Mild pronation abnormality bilaterally

## 2012-04-11 NOTE — Assessment & Plan Note (Signed)
Bilateral: Sports and soles with scaphoid pads and metatarsal pads Eccentric heel exercises and ice massage Followup in one month for custom orthotic  

## 2012-04-11 NOTE — Patient Instructions (Addendum)
Thank you for coming in today. Try the inserts for 1 month.  If they work well come back in 1 month for custom orthotics.  Do the heel lifts (go down slowly) and pull back on the toe stretch ice massage.

## 2012-04-11 NOTE — Assessment & Plan Note (Signed)
Bilateral: Sports and soles with scaphoid pads and metatarsal pads Eccentric heel exercises and ice massage Followup in one month for custom orthotic

## 2012-04-18 ENCOUNTER — Telehealth: Payer: Self-pay

## 2012-04-18 NOTE — Telephone Encounter (Signed)
PATIENT STATES THAT HER COUNSELOR SUGGESTED TAKING AN ANTI-DEPRESSANT AND SHE WOULD TO GET AN OPINION ON THIS. SEES RYAN DUNN. [phone].

## 2012-04-18 NOTE — Telephone Encounter (Signed)
Yes, please have her come see me for this. We have not gone into details regarding that.

## 2012-04-18 NOTE — Telephone Encounter (Signed)
Patient advised. Summer Shields

## 2012-04-18 NOTE — Telephone Encounter (Signed)
Do you want her to return to clinic for this?

## 2012-04-21 ENCOUNTER — Ambulatory Visit: Payer: Managed Care, Other (non HMO) | Admitting: Physician Assistant

## 2012-04-21 VITALS — BP 117/72 | HR 84 | Temp 98.1°F | Resp 16 | Ht 66.5 in | Wt 169.0 lb

## 2012-04-21 DIAGNOSIS — F329 Major depressive disorder, single episode, unspecified: Secondary | ICD-10-CM

## 2012-04-21 DIAGNOSIS — F32A Depression, unspecified: Secondary | ICD-10-CM

## 2012-04-21 MED ORDER — FLUOXETINE HCL 20 MG PO TABS: ORAL_TABLET | ORAL | Status: DC

## 2012-04-21 NOTE — Progress Notes (Signed)
Patient ID: Summer Shields MRN: [id number], DOB: 1985-11-07, 27 y.o. Date of Encounter: 04/21/2012, 2:00 PM  Primary Physician: Pcp Not In System  Chief Complaint: Depression  HPI: 27 y.o. female with history below presents for evaluation of depression. Patient has been seeing her counselor for several weeks now and she has recommended that patient seek evaluation for possible antidepressant. Patient states that she has been battling depression off and on since her college years. At that time it was so bad that she did not even want to get out of bed and she was wondering if it would just be easier to "not wake up." During this time in her life she never developed a plan to harm herself. She just got through her rough times at that point on her own. She states her depression is not as bad as it was as her college years, but characterizes her life as being "tired" frequently. She will have trouble with co-workers at work sometimes and will let certain things that should not bother her, bother her. She has had to leave work early a couple of times within the past two months due to the above and miss a day afterwards while she recuperates. She states that no one in her family has a formal diagnosis of depression, but several family members display symptoms. She has never been placed on an antidepressant before, but is interested in starting one today. She has had 3 suicidal thoughts within the past month, "I kept count." None currently. Never developed a plan with any of those thoughts and no current plan to harm herself. No HI.   Past Medical History  Diagnosis Date  . Plantar fasciitis   . Fibroadenoma   . Environmental allergies   . Anxiety   . Depression      Home Meds: Prior to Admission medications   Medication Sig Start Date End Date Taking? Authorizing Provider  ALPRAZolam Prudy Feeler) 0.5 MG tablet Take 1 tablet (0.5 mg total) by mouth 2 (two) times daily as needed for anxiety. 04/03/12  Yes  Chalyn Amescua M Messi Twedt, PA-C  fluticasone (FLONASE) 50 MCG/ACT nasal spray Place 2 sprays into the nose daily. 11/28/11  Yes Sherisse Fullilove M Marillyn Goren, PA-C  naproxen (NAPROSYN) 500 MG tablet Take 1 tablet (500 mg total) by mouth 2 (two) times daily with a meal. 03/22/12  Yes Dylann Gallier M Renae Mottley, PA-C  norgestimate-ethinyl estradiol (SPRINTEC 28) 0.25-35 MG-MCG tablet Take 1 tablet by mouth daily. 11/28/11  Yes Kloe Oates Adria Devon, PA-C           Allergies:  Allergies  Allergen Reactions  . Amoxicillin   . Codeine   . Tramadol Nausea And Vomiting    History   Social History  . Marital Status: Single    Spouse Name: N/A    Number of Children: N/A  . Years of Education: N/A   Occupational History  . Not on file.   Social History Main Topics  . Smoking status: Never Smoker   . Smokeless tobacco: Not on file  . Alcohol Use: No  . Drug Use: No  . Sexually Active: Yes    Birth Control/ Protection: None   Other Topics Concern  . Not on file   Social History Narrative  . No narrative on file     Review of Systems: Constitutional: negative for chills, fever, night sweats, weight changes, or fatigue  Psychological: positive for anxiety and depression. negative for anhedonia, SI, or HI.     Physical Exam:  Blood pressure 117/72, pulse 84, temperature 98.1 F (36.7 C), temperature source Oral, resp. rate 16, height 5' 6.5" (1.689 m), weight 169 lb (76.658 kg), SpO2 100.00%., Body mass index is 26.87 kg/(m^2). General: Well developed, well nourished, in no acute distress. Head: Normocephalic, atraumatic, eyes without discharge, sclera non-icteric, nares are without discharge.   Neck: Supple. Full ROM.  Lungs: Breathing is unlabored. Heart: Regular rate. Msk:  Strength and tone normal for age. Extremities/Skin: Warm and dry. No clubbing or cyanosis. No edema. No rashes or suspicious lesions. Neuro: Alert and oriented X 3. Moves all extremities spontaneously. Gait is normal. CNII-XII grossly in tact. Psych:  Responds  to questions appropriately with a normal affect. Not responding to internal stimuli.      ASSESSMENT AND PLAN:  27 y.o. female with depression and history of SI.  -Trial of Prozac 20 mg 1 po daily for 2 weeks, then 2 po daily #60 RF 3 - Call with an update in 2 weeks, sooner if needed -Good support system in place locally -Continue with her counseling sessions with Summer Shields -Publishing rights manager, she understands that if she feels like she is worsening she is to let either a family member, friend, [facility], or her counselor know right away to get her help. She agrees to this.  -She is not currently suicidal and has never developed a plan.  -She denies any current SI thoughts or ideas. Never with plans to hurt herself or others.  -Follow up based on her telephone update   Signed, Eula Listen, PA-C 04/21/2012 2:00 PM

## 2012-05-07 ENCOUNTER — Telehealth: Payer: Self-pay

## 2012-05-07 NOTE — Telephone Encounter (Signed)
Pt is calling in regards to a medication that Eula Listen put her on. She is wanting to let him know that there is no negative responses. And she is wanting to know if he wants her to stay on it. Call back number is [phone]

## 2012-05-07 NOTE — Telephone Encounter (Signed)
No ,she is fine on this medication. States you discussed Cymbalta with her, but wanted to try the Prozac first. She is asking because you had discussed it with her.

## 2012-05-07 NOTE — Telephone Encounter (Signed)
Please get info. Is she not improving on current medication? Did therapist recommend Cymbalta?

## 2012-05-07 NOTE — Telephone Encounter (Signed)
Lets keep things where they are right now then. Have her call me in 4 weeks.

## 2012-05-07 NOTE — Telephone Encounter (Signed)
Called her, to advise she should use this long term usually 6 months to a year. She is asking if you also want her to try Cymbalta, do you want to try this?

## 2012-05-08 NOTE — Telephone Encounter (Signed)
Thanks, have called her to advise.

## 2012-05-16 ENCOUNTER — Ambulatory Visit (INDEPENDENT_AMBULATORY_CARE_PROVIDER_SITE_OTHER): Payer: Managed Care, Other (non HMO) | Admitting: Family Medicine

## 2012-05-16 VITALS — BP 113/73 | Ht 67.0 in | Wt 165.0 lb

## 2012-05-16 DIAGNOSIS — M722 Plantar fascial fibromatosis: Secondary | ICD-10-CM

## 2012-05-16 DIAGNOSIS — M2580 Other specified joint disorders, unspecified joint: Secondary | ICD-10-CM

## 2012-05-16 DIAGNOSIS — R269 Unspecified abnormalities of gait and mobility: Secondary | ICD-10-CM

## 2012-05-16 DIAGNOSIS — M7741 Metatarsalgia, right foot: Secondary | ICD-10-CM

## 2012-05-16 DIAGNOSIS — M7750 Other enthesopathy of unspecified foot: Secondary | ICD-10-CM

## 2012-05-16 DIAGNOSIS — M7742 Metatarsalgia, left foot: Secondary | ICD-10-CM

## 2012-05-16 DIAGNOSIS — M948X9 Other specified disorders of cartilage, unspecified sites: Secondary | ICD-10-CM

## 2012-05-16 NOTE — Patient Instructions (Addendum)
Thank you for coming in today. Try the orthotics.  Add the metatarsal pads if you need them. Come back as needed.

## 2012-05-16 NOTE — Progress Notes (Addendum)
Summer Shields is a 27 y.o. female who presents to [facility] today for follow up foot pain and orthotic evaluation.   Patient was recently seen on 04/11/12 for bilateral metatarsalgia and plantar fasciitis. She was fitted with sports insoles with scaphoid and metatarsal pads. This has significantly improved her symptoms however she is already breaking down the temporary insoles. She feels well and denies any radiating pain weakness or numbness. She does note some pain on the tarsal metatarsal joint at the first ray on the dorsal aspect of her left foot.  This however is improved since last visit. She feels well otherwise.     PMH reviewed. Otherwise healthy History  Substance Use Topics  . Smoking status: Never Smoker   . Smokeless tobacco: Not on file  . Alcohol Use: No   ROS as above otherwise neg   Exam:  BP 113/73  Ht 5\' 7"  (1.702 m)  Wt 165 lb (74.844 kg)  BMI 25.84 kg/m2 Gen: Well NAD MSK:  Standing foot exam bilaterally shows cavus appearing foot. Standing with significant longitudinal arch collapse and pronation.  She has callus formation overlying the second and third metatarsal heads bilaterally. Additionally her second third and first metatarsal heads are somewhat tender to palpation.  She has normal foot motion and strength.  Additionally she has normal pulses capillary refill and sensation.   Gait: Mild pronation abnormality bilaterally  Patient was fitted for a : standard, cushioned, semi-rigid orthotic. The orthotic was heated and afterward the patient stood on the orthotic blank positioned on the orthotic stand. The patient was positioned in subtalar neutral position and 10 degrees of ankle dorsiflexion in a weight bearing stance. After completion of molding, a stable base was applied to the orthotic blank. The blank was ground to a stable position for weight bearing. Size: 8 Base: Blue EVA Posting and Padding: Left first ray post. Metatarsal pads   Assessment and  plan: Custom orthotics with first ray post of the left foot.  Remove metatarsal pads as they were uncomfortable. Patient will fit them herself she feels that she needs them. Followup as needed 40 minutes greater than 50% face-to-face time  Addendum:  Patient returned one and half weeks later with left foot pain. She notes pain in the plantar aspect of her left first MTP.   I suspect sesamoiditis Modified orthotics to float  the left sesamoids.  Additionally ordered an x-ray including sesamoid views.  Will followup in 4 weeks with x-rays if not improved.

## 2012-05-25 DIAGNOSIS — S93509A Unspecified sprain of unspecified toe(s), initial encounter: Secondary | ICD-10-CM | POA: Insufficient documentation

## 2012-05-25 NOTE — Addendum Note (Signed)
Addended by: Rodolph Bong on: 05/25/2012 09:19 AM   Modules accepted: Orders

## 2012-05-30 ENCOUNTER — Ambulatory Visit (INDEPENDENT_AMBULATORY_CARE_PROVIDER_SITE_OTHER): Payer: Managed Care, Other (non HMO) | Admitting: Family Medicine

## 2012-05-30 ENCOUNTER — Ambulatory Visit
Admission: RE | Admit: 2012-05-30 | Discharge: 2012-05-30 | Disposition: A | Discharge status: Home / Self Care | Payer: Managed Care, Other (non HMO) | Source: Ambulatory Visit | Attending: Family Medicine | Admitting: Family Medicine

## 2012-05-30 VITALS — BP 104/60 | Ht 67.0 in | Wt 165.0 lb

## 2012-05-30 DIAGNOSIS — M7742 Metatarsalgia, left foot: Secondary | ICD-10-CM

## 2012-05-30 DIAGNOSIS — M7741 Metatarsalgia, right foot: Secondary | ICD-10-CM

## 2012-05-30 DIAGNOSIS — M2580 Other specified joint disorders, unspecified joint: Secondary | ICD-10-CM

## 2012-05-30 DIAGNOSIS — M948X9 Other specified disorders of cartilage, unspecified sites: Secondary | ICD-10-CM

## 2012-05-30 NOTE — Assessment & Plan Note (Signed)
Concern for sesamoid stress fracture versus sesamoiditis. Discussed options.  Ultrasound consistent with stress fracture Plan: Rigid soled postoperative shoe MRI Will call following MRI

## 2012-05-30 NOTE — Progress Notes (Signed)
Summer Shields is a 27 y.o. female who presents to [facility] today for left foot first MTP pain. Present for one or 2 months. Pain is worse on the plantar aspect of her foot. She was fitted for custom orthotics which have not helped. These are additionally modified which did not seem to make much of a difference. She denies any injury or increased activity. Pain is worse with walking and somewhat better with rest. No radiating pain weakness or numbness fevers or chills. She feels well otherwise.    PMH reviewed.  History  Substance Use Topics  . Smoking status: Never Smoker   . Smokeless tobacco: Not on file  . Alcohol Use: No   ROS as above otherwise neg   Exam:  BP 104/60  Ht 5\' 7"  (1.702 m)  Wt 165 lb (74.844 kg)  BMI 25.84 kg/m2 Gen: Well NAD MSK: Left first MTP Normal-appearing no effusion Normal motion Tender to palpation medial sesamoid. Pulses capillary refill and sensation are intact distally  Dg Foot Complete Left  05/30/2012   *RADIOLOGY REPORT*  Clinical Data: Pain in the first MTP joint, no injury  LEFT FOOT - COMPLETE 3+ VIEW  Comparison: Left foot films of 01/30/2012  Findings: Tarsal - metatarsal alignment is normal.  Joint spaces appear normal.  No significant abnormality is seen.  A view of the sesamoid bones shows no abnormality.  IMPRESSION: Negative.   Original Report Authenticated By: Dwyane Dee, M.D.   Limited musculoskeletal ultrasound of the left first MTP.  Plantar medial sesamoid identified under ultrasound. There appears to be a cortical disruption seen in both long and short views consistent with stress fracture. Overlain hypoechoic change consistent with edema. No increased Doppler activity. Ultrasound of the first MTP and first TMT are both normal otherwise.

## 2012-05-30 NOTE — Addendum Note (Signed)
Addended by: Rodolph Bong on: 05/30/2012 01:11 PM   Modules accepted: Orders

## 2012-05-30 NOTE — Patient Instructions (Addendum)
Thank you for coming in today. I am sorry you are not better.  Wear the shoe as needed.  We will get a MRI to know for sure where your pain is coming from.  I will call after your MRI to discuss the plan.  Thank you for being patient with [place] while we figure this out.    You have been scheduled for an appointment 06/01/12 9:45am, please arrive at 9:15 am [facility] [address] [phone]

## 2012-06-01 ENCOUNTER — Ambulatory Visit
Admission: RE | Admit: 2012-06-01 | Discharge: 2012-06-01 | Disposition: A | Discharge status: Home / Self Care | Payer: Managed Care, Other (non HMO) | Source: Ambulatory Visit | Attending: Family Medicine | Admitting: Family Medicine

## 2012-06-01 DIAGNOSIS — M2580 Other specified joint disorders, unspecified joint: Secondary | ICD-10-CM

## 2012-06-07 ENCOUNTER — Telehealth: Payer: Self-pay | Admitting: Family Medicine

## 2012-06-07 MED ORDER — MELOXICAM 15 MG PO TABS: 15.0000 mg | ORAL_TABLET | Freq: Every day | ORAL | Status: DC

## 2012-06-07 NOTE — Telephone Encounter (Signed)
Left a message about MRI and plan Patient will call back I called in Meloxicam

## 2012-06-14 ENCOUNTER — Ambulatory Visit (INDEPENDENT_AMBULATORY_CARE_PROVIDER_SITE_OTHER): Payer: Managed Care, Other (non HMO) | Admitting: Sports Medicine

## 2012-06-14 VITALS — BP 100/67 | Ht 67.0 in | Wt 165.0 lb

## 2012-06-14 DIAGNOSIS — S96912D Strain of unspecified muscle and tendon at ankle and foot level, left foot, subsequent encounter: Secondary | ICD-10-CM

## 2012-06-14 DIAGNOSIS — Z5189 Encounter for other specified aftercare: Secondary | ICD-10-CM

## 2012-06-14 NOTE — Progress Notes (Signed)
Summer Shields is a 27 y.o. female who presents to [facility] today for followup left great toe pain. Patient had an MRI which shows capsular strain of the plantar left first MTP.  She has been using a postoperative shoe which has been very helpful. Her pain is decreasing. She forgot to bring orthotics today. No radiating pain weakness or numbness.   PMH reviewed.  History  Substance Use Topics  . Smoking status: Never Smoker   . Smokeless tobacco: Not on file  . Alcohol Use: No   ROS as above otherwise neg   Exam:  BP 100/67  Ht 5\' 7"  (1.702 m)  Wt 165 lb (74.844 kg)  BMI 25.84 kg/m2 Gen: Well NAD MSK: Left first toe. Mildly tender plantar surface.   Mr Foot Left Wo Contrast  06/01/2012   *RADIOLOGY REPORT*  Clinical Data: Medial foot pain for the past 5 months  MRI OF THE LEFT FOREFOOT WITHOUT CONTRAST  Technique:  Multiplanar, multisequence MR imaging was performed. No intravenous contrast was administered.  Comparison: 05/30/2012  Findings: Lisfranc ligament intact.  No significant abnormal osseous edema in the metatarsals, phalanges, or first digit sesamoids.  A marker indicating the point of the patient's pain at the level of the first digit medial sesamoid.  Fluid undercutting the first digit plantar plate at the MTP joint on images 9-10 of series 8 seems greater than would be expected for plantar plate recess, and raises concern for a partial tear of the plantar plate.  No intermetatarsal mass lesion to suggest Morton's neuroma.  Flexor hallucis brevis medial head unremarkable.  IMPRESSION:  1.  No sesamoiditis observed.  The only potentially significant finding in the vicinity of the patient's pain is a suspected partial tear of the plantar plate at the first MTP joint.   Original Report Authenticated By: Gaylyn Rong, M.D.   Dg Foot Complete Left  05/30/2012   *RADIOLOGY REPORT*  Clinical Data: Pain in the first MTP joint, no injury  LEFT FOOT - COMPLETE 3+ VIEW  Comparison: Left  foot films of 01/30/2012  Findings: Tarsal - metatarsal alignment is normal.  Joint spaces appear normal.  No significant abnormality is seen.  A view of the sesamoid bones shows no abnormality.  IMPRESSION: Negative.   Original Report Authenticated By: Dwyane Dee, M.D.

## 2012-06-14 NOTE — Patient Instructions (Addendum)
Thank you for coming in today. Wear the boot for 2-3 more weeks.  Return in 3 weeks.

## 2012-06-14 NOTE — Assessment & Plan Note (Signed)
Capsule strain left first MTP plantar surface.  Patient forgot to bring orthotic today. Plan to continue postoperative shoe for 2 more weeks Wean into orthotics when able Followup 3 weeks

## 2012-06-18 ENCOUNTER — Other Ambulatory Visit: Payer: Self-pay

## 2012-06-18 DIAGNOSIS — F32A Depression, unspecified: Secondary | ICD-10-CM

## 2012-06-18 DIAGNOSIS — F329 Major depressive disorder, single episode, unspecified: Secondary | ICD-10-CM

## 2012-06-18 MED ORDER — FLUOXETINE HCL 20 MG PO TABS: ORAL_TABLET | ORAL | Status: DC

## 2012-06-27 ENCOUNTER — Ambulatory Visit (INDEPENDENT_AMBULATORY_CARE_PROVIDER_SITE_OTHER): Payer: Managed Care, Other (non HMO) | Admitting: Family Medicine

## 2012-06-27 VITALS — BP 110/76 | Ht 67.0 in | Wt 165.0 lb

## 2012-06-27 DIAGNOSIS — Z5189 Encounter for other specified aftercare: Secondary | ICD-10-CM

## 2012-06-27 DIAGNOSIS — M7750 Other enthesopathy of unspecified foot: Secondary | ICD-10-CM

## 2012-06-27 DIAGNOSIS — M7741 Metatarsalgia, right foot: Secondary | ICD-10-CM

## 2012-06-27 DIAGNOSIS — S96912D Strain of unspecified muscle and tendon at ankle and foot level, left foot, subsequent encounter: Secondary | ICD-10-CM

## 2012-06-27 NOTE — Progress Notes (Signed)
Summer Shields is a 27 y.o. female who presents to [facility] today for continued left first MTP pain. Patient continues to have significant pain left first MTP despite compliance with postoperative rigid soled shoe. Additionally she has failed custom orthotics. She's had an MRI which does show capsular strain of the plantar aspect of the first MTP.    PMH reviewed.  History  Substance Use Topics  . Smoking status: Never Smoker   . Smokeless tobacco: Not on file  . Alcohol Use: No   ROS as above otherwise neg   Exam:  BP 110/76  Ht 5\' 7"  (1.702 m)  Wt 165 lb (74.844 kg)  BMI 25.84 kg/m2 Gen: Well NAD MSK: Normal toe motion continued pain in the plantar aspect.   Mr Foot Left Wo Contrast  06/01/2012   *RADIOLOGY REPORT*  Clinical Data: Medial foot pain for the past 5 months  MRI OF THE LEFT FOREFOOT WITHOUT CONTRAST  Technique:  Multiplanar, multisequence MR imaging was performed. No intravenous contrast was administered.  Comparison: 05/30/2012  Findings: Lisfranc ligament intact.  No significant abnormal osseous edema in the metatarsals, phalanges, or first digit sesamoids.  A marker indicating the point of the patient's pain at the level of the first digit medial sesamoid.  Fluid undercutting the first digit plantar plate at the MTP joint on images 9-10 of series 8 seems greater than would be expected for plantar plate recess, and raises concern for a partial tear of the plantar plate.  No intermetatarsal mass lesion to suggest Morton's neuroma.  Flexor hallucis brevis medial head unremarkable.  IMPRESSION:  1.  No sesamoiditis observed.  The only potentially significant finding in the vicinity of the patient's pain is a suspected partial tear of the plantar plate at the first MTP joint.   Original Report Authenticated By: Gaylyn Rong, M.D.   Dg Foot Complete Left  05/30/2012   *RADIOLOGY REPORT*  Clinical Data: Pain in the first MTP joint, no injury  LEFT FOOT - COMPLETE 3+ VIEW   Comparison: Left foot films of 01/30/2012  Findings: Tarsal - metatarsal alignment is normal.  Joint spaces appear normal.  No significant abnormality is seen.  A view of the sesamoid bones shows no abnormality.  IMPRESSION: Negative.   Original Report Authenticated By: Dwyane Dee, M.D.

## 2012-06-27 NOTE — Assessment & Plan Note (Addendum)
Capsule strain left first MTP plantar surface.  Unsure of where to go from here.  At this time I feel is reasonable for her to be evaluated by a orthopedic foot specialist.  Will refer to Dr. Lajoyce Corners. There may be a course of treatment or not familiar with her surgical options for this. Followup here as needed

## 2012-06-27 NOTE — Patient Instructions (Addendum)
Thank you for coming in today. I am sorry you are still hurting.  I think that at this point a second opinion is reasonable.  I will arrange for you to see Dr. Lajoyce Corners here in [place].  He may recommend surgery or an other treatment.  You are of course always welcome to come back.    You have been scheduled for an appointment with Dr. Lajoyce Corners at [facility] Friday 06/29/12 at 10:30 am - please arrive 15 minutes early.   Their office is located at  [address], [place], [place] [address]  Phone number is [phone]

## 2012-07-03 ENCOUNTER — Telehealth: Payer: Self-pay | Admitting: Radiology

## 2012-07-03 NOTE — Telephone Encounter (Signed)
error 

## 2012-07-04 ENCOUNTER — Other Ambulatory Visit: Payer: Self-pay | Admitting: Radiology

## 2012-07-04 NOTE — Telephone Encounter (Signed)
Patients pharmacy advised 90 day supply okay since this is needed for insurance

## 2012-07-10 ENCOUNTER — Ambulatory Visit: Payer: Managed Care, Other (non HMO) | Admitting: Sports Medicine

## 2012-07-19 ENCOUNTER — Other Ambulatory Visit: Payer: Self-pay | Admitting: Physician Assistant

## 2012-08-29 ENCOUNTER — Ambulatory Visit (INDEPENDENT_AMBULATORY_CARE_PROVIDER_SITE_OTHER): Payer: Managed Care, Other (non HMO) | Admitting: Physician Assistant

## 2012-08-29 VITALS — BP 112/62 | HR 88 | Temp 98.9°F | Resp 18 | Ht 66.5 in | Wt 162.0 lb

## 2012-08-29 DIAGNOSIS — F3289 Other specified depressive episodes: Secondary | ICD-10-CM

## 2012-08-29 DIAGNOSIS — F329 Major depressive disorder, single episode, unspecified: Secondary | ICD-10-CM

## 2012-08-29 DIAGNOSIS — F32A Depression, unspecified: Secondary | ICD-10-CM

## 2012-08-29 MED ORDER — BUPROPION HCL ER (XL) 150 MG PO TB24: ORAL_TABLET | ORAL | Status: DC

## 2012-08-29 NOTE — Progress Notes (Signed)
Patient ID: Summer Shields MRN: [id number], DOB: 17-Sep-1985, 27 y.o. Date of Encounter: 08/29/2012, 3:45 PM  Primary Physician: Pcp Not In System  Chief Complaint: Follow up depression  HPI: 27 y.o. female with history below presents for follow up of depression. See previous office visits. Saw her psychologist Raynelle Fanning today. Has not been doing well lately. The past weekend all she wanted to do was lay in the bed. States the main reason she got out of bed was to feed her pets. She finds herself to be unmotivated lately. When she gets home from work all she wants to do is sit on the sofa. She is sad that her grandfather's health is poor. She will still occasionally find some fun things to go and do with her boyfriend. He is her main local support system currently. States that her psychologist wonders if it is time to change medications for her depression. No thoughts of harming others. Patient does state that she did have one thought of harming herself the past Sunday by sitting on the sofa for 72 hours straight and not drinking water. No further thought of harming herself. No plan to harm herself.   Secondly she does mention that she discussed with her psychologist that she has difficulty focusing and completing tasks both at home and at work. Her younger sister was just diagnosed with ADHD and depression. She is wondering if this is going on in her as well. She is planning to seek formal evaluation for this.    Past Medical History  Diagnosis Date  . Plantar fasciitis   . Fibroadenoma   . Environmental allergies   . Anxiety   . Depression      Home Meds: Prior to Admission medications   Medication Sig Start Date End Date Taking? Authorizing Provider  ALPRAZolam Prudy Feeler) 0.5 MG tablet TAKE 1 TABLET BY MOUTH TWICE A DAY AS NEEDED 07/19/12  Yes Ryan M Dunn, PA-C  FLUoxetine (PROZAC) 20 MG tablet Take 1 po daily for 2 weeks then 2 po daily. 06/18/12  Yes Ryan M Dunn, PA-C  fluticasone (FLONASE) 50  MCG/ACT nasal spray Place 2 sprays into the nose daily. 11/28/11  Yes Ryan M Dunn, PA-C  meloxicam (MOBIC) 15 MG tablet Take 1 tablet (15 mg total) by mouth daily. 06/07/12  Yes Rodolph Bong, MD  norgestimate-ethinyl estradiol (SPRINTEC 28) 0.25-35 MG-MCG tablet Take 1 tablet by mouth daily. 11/28/11  Yes Ryan M Dunn, PA-C  naproxen (NAPROSYN) 500 MG tablet Take 1 tablet (500 mg total) by mouth 2 (two) times daily with a meal. 03/22/12   Sondra Barges, PA-C    Allergies:  Allergies  Allergen Reactions  . Amoxicillin   . Codeine   . Tramadol Nausea And Vomiting    History   Social History  . Marital Status: Single    Spouse Name: N/A    Number of Children: N/A  . Years of Education: N/A   Occupational History  . Not on file.   Social History Main Topics  . Smoking status: Never Smoker   . Smokeless tobacco: Not on file  . Alcohol Use: No  . Drug Use: No  . Sexual Activity: Yes    Birth Control/ Protection: None   Other Topics Concern  . Not on file   Social History Narrative  . No narrative on file     Review of Systems: Constitutional: positive for fatigue. negative for chills, fever, or weight changes  HEENT: negative for vision  changes or hearing loss Cardiovascular: negative for chest pain or palpitations Respiratory: negative for wheezing, shortness of breath, or cough Abdominal: negative for abdominal pain, nausea, vomiting, or diarrhea Dermatological: negative for rash Neurologic: negative for headache, dizziness, or syncope Psychological: see above    Physical Exam: Blood pressure 112/62, pulse 88, temperature 98.9 F (37.2 C), temperature source Oral, resp. rate 18, height 5' 6.5" (1.689 m), weight 162 lb (73.483 kg), last menstrual period 08/08/2012, SpO2 100.00%., Body mass index is 25.76 kg/(m^2). General: Well developed, well nourished, in no acute distress. Head: Normocephalic, atraumatic, eyes without discharge, sclera non-icteric, nares are without  discharge.   Neck: Supple. Full ROM. Lungs: Clear bilaterally to auscultation without wheezes, rales, or rhonchi. Breathing is unlabored. Heart: RRR with S1 S2. No murmurs, rubs, or gallops appreciated. Msk:  Strength and tone normal for age. Extremities/Skin: Warm and dry. No clubbing or cyanosis. No edema. No rashes or suspicious lesions. Neuro: Alert and oriented X 3. Moves all extremities spontaneously. Gait is normal. CNII-XII grossly in tact. Psych:  Responds to questions appropriately with a normal affect. Does not appear to be responding to internal stimuli.      ASSESSMENT AND PLAN:  27 y.o. female with depression. -Taper Prozac over the next 2 weeks -Start Wellbutrin XL 150 mg 1 po daily, may increase to 2 tabs daily after 1 week #60 RF 1  -I discussed with her the benefits of this medication being used for both depression and possible ADHD tendencies  -Follow up 1 month -Verbal safety contract -RTC/ER precautions   Signed, Eula Listen, PA-C 08/29/2012 3:45 PM

## 2012-09-10 ENCOUNTER — Other Ambulatory Visit: Payer: Self-pay | Admitting: Physician Assistant

## 2012-09-14 ENCOUNTER — Other Ambulatory Visit: Payer: Self-pay | Admitting: Physician Assistant

## 2012-09-14 NOTE — Progress Notes (Signed)
FMLA paperwork completed.

## 2012-09-20 ENCOUNTER — Telehealth: Payer: Self-pay | Admitting: Radiology

## 2012-09-20 NOTE — Telephone Encounter (Signed)
FMLA form has been completed please charge form fee and let me know so I can advise patient it is ready for pick up.

## 2012-10-01 DIAGNOSIS — Z0271 Encounter for disability determination: Secondary | ICD-10-CM

## 2012-10-01 NOTE — Telephone Encounter (Signed)
Patient is coming to pick up paperwork this evening and will pay then.

## 2012-10-01 NOTE — Telephone Encounter (Signed)
Forms given to Summer Shields.

## 2012-10-02 ENCOUNTER — Other Ambulatory Visit: Payer: Self-pay | Admitting: Physician Assistant

## 2012-10-02 DIAGNOSIS — IMO0001 Reserved for inherently not codable concepts without codable children: Secondary | ICD-10-CM

## 2012-10-02 MED ORDER — NORGESTIMATE-ETH ESTRADIOL 0.25-35 MG-MCG PO TABS: 1.0000 | ORAL_TABLET | Freq: Every day | ORAL | Status: DC

## 2012-10-08 ENCOUNTER — Ambulatory Visit (INDEPENDENT_AMBULATORY_CARE_PROVIDER_SITE_OTHER): Payer: Managed Care, Other (non HMO) | Admitting: Physician Assistant

## 2012-10-08 VITALS — BP 102/70 | HR 88 | Temp 98.0°F | Resp 18 | Ht 66.5 in | Wt 164.4 lb

## 2012-10-08 DIAGNOSIS — F32A Depression, unspecified: Secondary | ICD-10-CM

## 2012-10-08 DIAGNOSIS — F329 Major depressive disorder, single episode, unspecified: Secondary | ICD-10-CM

## 2012-10-08 DIAGNOSIS — M25572 Pain in left ankle and joints of left foot: Secondary | ICD-10-CM

## 2012-10-08 DIAGNOSIS — M25579 Pain in unspecified ankle and joints of unspecified foot: Secondary | ICD-10-CM

## 2012-10-08 NOTE — Progress Notes (Signed)
Patient ID: KATRYN PLUMMER MRN: [id number], DOB: 1985/03/28, 28 y.o. Date of Encounter: 10/08/2012, 8:06 PM  Primary Physician: Pcp Not In System  Chief Complaint: Follow up  HPI: 27 y.o. female with history below presents for follow up of her depression. She is improved from her last visit on 08/29/12. At that visit we started her on Wellbutrin XL 150 mg 1 po daily with the goal to titrate her up to bid dosing. She has not yet titrated up to bid dosing. We have stopped her Prozac. She reports having more frequent "better days" with the Wellbutrin than with the Prozac. She is more frequently getting up and out and about. Although she does still have some days where she just does not want to get out of bed. These days mostly occur on the weekends. She usually gets out of bed around 1 PM. When asked how she feels about getting up at that time she states, "groggy." She reports instead of being 25-30 minutes late to work, now she is only 5-10 minutes late. She continues to have her same support system of her boyfriend, aunt, and father. She is able to talk to them whenever needed. She still sees her therapist regularly. Her next appointment with her is 10/09/12. She is still interested in a formal evaluation for ADD. No further SI. No HI.   She also mentions a return of her bilateral foot pain, left greater than right. She recently saw Dr. Lajoyce Corners for this. She was prescribed new orthotics and advised to taper off NSAIDs in a month. Patient went to get orthotics, however the person making the orthotics stated they should be different so patient got what this person advised. Patient states they helped at first, but now her pain seems to have regressed to where it was at the initial presentation. She is not sure where to go next. She will have to take breaks from walking at times, or even ask for a wheelchair.    Past Medical History  Diagnosis Date  . Plantar fasciitis   . Fibroadenoma   . Environmental  allergies   . Anxiety   . Depression   . Allergy      Home Meds: Prior to Admission medications   Medication Sig Start Date End Date Taking? Authorizing Provider  ALPRAZolam Prudy Feeler) 0.5 MG tablet TAKE 1 TABLET BY MOUTH TWICE A DAY AS NEEDED 07/19/12  Yes Travia Onstad M Josephina Melcher, PA-C  buPROPion (WELLBUTRIN XL) 150 MG 24 hr tablet 1 po daily, may increase to 2 tabs daily after 1 week 08/29/12  Yes Charlise Giovanetti M Letetia Romanello, PA-C  fluticasone (FLONASE) 50 MCG/ACT nasal spray Place 2 sprays into the nose daily. 11/28/11  Yes Robena Ewy M Sten Dematteo, PA-C  meloxicam (MOBIC) 15 MG tablet Take 1 tablet (15 mg total) by mouth daily. 06/07/12  Yes Rodolph Bong, MD  norgestimate-ethinyl estradiol (ORTHO-CYCLEN,SPRINTEC,PREVIFEM) 0.25-35 MG-MCG tablet Take 1 tablet by mouth daily. 10/02/12  Yes Kenzlee Fishburn Adria Devon, PA-C                  Allergies:  Allergies  Allergen Reactions  . Amoxicillin   . Codeine   . Tramadol Nausea And Vomiting    History   Social History  . Marital Status: Single    Spouse Name: N/A    Number of Children: N/A  . Years of Education: N/A   Occupational History  . Not on file.   Social History Main Topics  . Smoking status: Never Smoker   .  Smokeless tobacco: Never Used  . Alcohol Use: No  . Drug Use: No  . Sexual Activity: Yes    Birth Control/ Protection: None   Other Topics Concern  . Not on file   Social History Narrative  . No narrative on file     Review of Systems: Constitutional: negative for chills, fever, or fatigue  Cardiovascular: negative for chest pain or palpitations Respiratory: negative for hemoptysis, wheezing, shortness of breath, or cough Dermatological: negative for rash Psychological: positive for depression. Negative for anxiety, anhedonia, SI, or HI.  Neurologic: negative for paresthesias     Physical Exam: Blood pressure 102/70, pulse 88, temperature 98 F (36.7 C), temperature source Oral, resp. rate 18, height 5' 6.5" (1.689 m), weight 164 lb 6.4 oz (74.571 kg),  last menstrual period 10/01/2012, SpO2 98.00%., Body mass index is 26.14 kg/(m^2). General: Well developed, well nourished, in no acute distress. Head: Normocephalic, atraumatic, eyes without discharge, sclera non-icteric, nares are without discharge. Bilateral auditory canals clear, TM's are without perforation, pearly grey and translucent with reflective cone of light bilaterally. Oral cavity moist, posterior pharynx without exudate, erythema, peritonsillar abscess, or post nasal drip.  Neck: Supple. No thyromegaly. Full ROM. No lymphadenopathy. Lungs: Clear bilaterally to auscultation without wheezes, rales, or rhonchi. Breathing is unlabored. Heart: RRR with S1 S2. No murmurs, rubs, or gallops appreciated. Msk:  Strength and tone normal for age. Extremities/Skin: Warm and dry. No clubbing or cyanosis. No edema. No rashes or suspicious lesions. Left foot: Exquisitely tender to palpation along the midline aspect of the plantar plate. Mild tender to palpation along the medial aspect. No TTP along the lateral aspect.  Neuro: Alert and oriented X 3. Moves all extremities spontaneously. Gait is normal. CNII-XII grossly in tact. Psych:  Responds to questions appropriately with a normal affect.     ASSESSMENT AND PLAN:  27 y.o. female with depression and bilateral foot pain.  1) Depression -Improving -Increase Wellbutrin XL to 150 mg 1 po bid -Call when needs a refill -Safety contract -Has good support system -Has appointment with therapist, Raynelle Fanning, on 10/09/12 -Follow up 2-3 months if doing well, sooner if needed  2) Bilateral foot pain -Longstanding issue -Advised her to reestablish care with Sports Medicine, bring MRI with her -Advised her that chronic use of NSAIDs is not a viable option -There is question of possible partial tear of the plantar plate. I do not have the overread or scan tonight for review.  -She can also revisit with the orthotics specialist and get his input on different  inserts vs different types of shoes  -I do feel that the foot pain is playing a role in her depression to a degree -She can follow up here anytime for this should she need help  Signed, Eula Listen, PA-C 10/08/2012 8:06 PM

## 2012-10-17 ENCOUNTER — Ambulatory Visit (INDEPENDENT_AMBULATORY_CARE_PROVIDER_SITE_OTHER): Payer: Managed Care, Other (non HMO) | Admitting: Emergency Medicine

## 2012-10-17 VITALS — BP 123/86 | Ht 66.0 in | Wt 162.0 lb

## 2012-10-17 DIAGNOSIS — M7741 Metatarsalgia, right foot: Secondary | ICD-10-CM

## 2012-10-17 DIAGNOSIS — M722 Plantar fascial fibromatosis: Secondary | ICD-10-CM

## 2012-10-17 DIAGNOSIS — M7750 Other enthesopathy of unspecified foot: Secondary | ICD-10-CM

## 2012-10-18 ENCOUNTER — Encounter: Payer: Self-pay | Admitting: Emergency Medicine

## 2012-10-18 NOTE — Progress Notes (Signed)
Patient ID: Summer Shields, female   DOB: 30-Jun-1985, 27 y.o.   MRN: [id number] Is a 27 year old female who presents with history of plantar fasciitis and metatarsalgia of both feet as well as a plantar plate rupture(questionable) on her left great toe.  She has used several different orthotics in the past but continues to have pain. Today she presents with pain in the plantar surface of her left foot the midpoint of the plantar pad, in the region of the head of the third metatarsal bone.  She seen the sports medicine clinic and had orthotics made with an metatarsal and first ray posting of the left foot in the spring, these did not completely alleviate her symptoms. She then followed up with a foot and ankle orthopedist where custom orthotics were made with a lateral posting, which she states caused a fair amount of discomfort to her foot. Since that time she has had a third pair of custom orthotics made. These feel similar to the original pair and help with her plantar fascia pain as well as the pain in her left great toe, and the did not alleviate the pain underneath the third metatarsal head.  Review of systems as per history of present illness otherwise negative.  Examination: BP 123/86  Ht 5\' 6"  (1.676 m)  Wt 162 lb (73.483 kg)  BMI 26.16 kg/m2  LMP 10/01/2012  This is a well-developed well-nourished 27 year old white female awake alert and oriented in no acute distress  Foot exam: Tenderness to palpation at the plantar fascial insertion of the calcaneus which is mild. Tenderness to palpation the head of the third metatarsal bone left foot which is easily palpable. In comparison to the right foot the third metatarsal head is much more prominent on the plantar surface. First great toe nontender to palpation.  Neurovascularly intact bilateral lower extremities.

## 2012-10-18 NOTE — Assessment & Plan Note (Signed)
Significant metatarsalgia of the left foot today. The metatarsal pad was added to her current custom orthotics. In addition the first ray pad was placed just proximal to the first MTP joint.  She will followup as needed.

## 2012-10-18 NOTE — Assessment & Plan Note (Signed)
She is still mildly symptomatic today. Her symptoms have markedly improved however, compared to 6 months ago.

## 2012-10-30 ENCOUNTER — Ambulatory Visit: Payer: Managed Care, Other (non HMO) | Admitting: Sports Medicine

## 2012-11-28 ENCOUNTER — Ambulatory Visit: Payer: Managed Care, Other (non HMO) | Admitting: Emergency Medicine

## 2012-11-30 ENCOUNTER — Other Ambulatory Visit: Payer: Self-pay | Admitting: Physician Assistant

## 2012-11-30 ENCOUNTER — Encounter: Payer: Self-pay | Admitting: Physician Assistant

## 2012-12-12 ENCOUNTER — Ambulatory Visit: Payer: Managed Care, Other (non HMO) | Admitting: Physician Assistant

## 2012-12-12 ENCOUNTER — Other Ambulatory Visit: Payer: Self-pay | Admitting: Radiology

## 2012-12-12 VITALS — BP 102/70 | HR 99 | Temp 98.6°F | Resp 16 | Ht 67.0 in | Wt 170.8 lb

## 2012-12-12 DIAGNOSIS — F329 Major depressive disorder, single episode, unspecified: Secondary | ICD-10-CM

## 2012-12-12 DIAGNOSIS — M25579 Pain in unspecified ankle and joints of unspecified foot: Secondary | ICD-10-CM

## 2012-12-12 DIAGNOSIS — F411 Generalized anxiety disorder: Secondary | ICD-10-CM

## 2012-12-12 DIAGNOSIS — F32A Depression, unspecified: Secondary | ICD-10-CM

## 2012-12-12 MED ORDER — BUPROPION HCL ER (XL) 150 MG PO TB24: ORAL_TABLET | ORAL | Status: DC

## 2012-12-12 MED ORDER — ALPRAZOLAM 0.5 MG PO TABS: 0.5000 mg | ORAL_TABLET | Freq: Two times a day (BID) | ORAL | Status: DC | PRN

## 2012-12-12 NOTE — Telephone Encounter (Signed)
Changed Wellbutrin from 30 day supply with 4 refills to 90 day supply, for insurance purposes.

## 2012-12-12 NOTE — Progress Notes (Signed)
Patient ID: Summer Shields MRN: [id number], DOB: Mar 29, 1985, 27 y.o. Date of Encounter: 12/12/2012, 11:39 AM  Primary Physician: Pcp Not In System  Chief Complaint: Medication refill   HPI: 27 y.o. female with history below presents for follow up of depression. She continues to feel depressed and has not noticed much improvement over the past several months. In fact, she notes an increase in emotional sensitivity towards the radio, commercials, and the TV. She continues to take Wellbutrin XL 150 mg bid and see her counselor Raynelle Fanning. She is wondering if she can continue to make any further progress with her counselor. She feels like that relationship has become stagnant. She will have "good days and bad days." She is able to get up and out of bed and function, but she gets stressed out and depressed over things that she cannot not control. 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 She has begun the process of opening up to some of her family members. Her brother is now living with her. Not much progress regarding her mother however. She continues to enjoy her pets and took in a rabbit that was left on her front porch. Her boyfriend wonders if she should see a psychiatrist. No SI or HI.   She also mentions feel ing quite discouraged regarding her feet. She last last Sport Medicine on 10/17/12 and reports no improvement from this visit. She would like to try and see someone else at this point. She states that she is able to walk during the day when she has to, but on the weekends she has a wheelchair now to get around.       Past Medical History  Diagnosis Date  . Plantar fasciitis   . Fibroadenoma   . Environmental allergies   . Anxiety   . Depression   . Allergy      Home Meds: Prior to Admission medications   Medication Sig Start Date End Date Taking? Authorizing Provider  ALPRAZolam Prudy Feeler) 0.5 MG tablet Take 1 tablet (0.5  mg total) by mouth 2 (two) times daily as needed for anxiety. 12/12/12  Yes Ryan M Dunn, PA-C  buPROPion (WELLBUTRIN XL) 150 MG 24 hr tablet TAKE 1 TABLET BY MOUTH EVERY DAY MAY INCREASE TO TAKE 2 TABLETS EVERY DAY AFTER 1 WEEK 12/12/12  Yes Ryan M Dunn, PA-C  fluticasone (FLONASE) 50 MCG/ACT nasal spray Place 2 sprays into the nose daily. 11/28/11  Yes Ryan M Dunn, PA-C  norgestimate-ethinyl estradiol (ORTHO-CYCLEN,SPRINTEC,PREVIFEM) 0.25-35 MG-MCG tablet Take 1 tablet by mouth daily. 10/02/12  Yes Ryan M Dunn, PA-C  meloxicam (MOBIC) 15 MG tablet Take 1 tablet (15 mg total) by mouth daily. 06/07/12   Rodolph Bong, MD    Allergies:  Allergies  Allergen Reactions  . Amoxicillin   . Codeine   . Tramadol Nausea And Vomiting    History   Social History  . Marital Status: Single    Spouse Name: N/A    Number of Children: N/A  . Years of Education: N/A   Occupational History  . Not on file.   Social History Main Topics  . Smoking status: Never Smoker   . Smokeless tobacco: Never Used  . Alcohol Use: No  . Drug Use: No  . Sexual Activity: Yes    Birth Control/ Protection: None   Other Topics Concern  . Not on file   Social History Narrative  . No  narrative on file     Review of Systems: Constitutional: positive for fatigue. negative for chills or fever  HEENT: negative for vision changes or hearing loss Dermatological: negative for rash Psychological: positive for depression and anxiety. negative for anhedonia, SI, or HI  Neurologic: negative for headache   Physical Exam: Blood pressure 102/70, pulse 99, temperature 98.6 F (37 C), temperature source Oral, resp. rate 16, height 5\' 7"  (1.702 m), weight 170 lb 12.8 oz (77.474 kg), last menstrual period 11/28/2012, SpO2 100.00%., Body mass index is 26.74 kg/(m^2). General: Well developed, well nourished, in no acute distress. Head: Normocephalic, atraumatic, eyes without discharge, sclera non-icteric, nares are without  discharge.   Neck: Supple. Full ROM.  Lungs: Breathing is unlabored. Heart: Regular rate. Msk:  Strength and tone normal for age. Extremities/Skin: Warm and dry. No clubbing or cyanosis. No edema. No rashes or suspicious lesions. Neuro: Alert and oriented X 3. Moves all extremities spontaneously. Gait is normal. CNII-XII grossly in tact. Psych:  Responds to questions appropriately with a normal affect. Does not appear to be responding to internal stimuli.      ASSESSMENT AND PLAN:  28 y.o. female with anxiety, depression, and bilateral foot pain  1) Anxiety/depression  -Refer to Dr. Laury Deep -Consider changing of her counselor  -Continue Wellbutrin XL 150 mg 1 po bid #180 no RF -Safety contract -Has a local support system in place  2) Bilateral foot pain -Will research outside medical facilities for further evaluation and treatment as she will not follow up at Sports Medicine any further -She is at the point of having to use a wheelchair on the weekends stating that she only wants to walk when she must -I do feel like these is interrelated to her above issues    Signed, Eula Listen, PA-C [facility] [place], [place] [address] [phone] 12/12/2012 11:39 AM

## 2012-12-20 ENCOUNTER — Ambulatory Visit (HOSPITAL_COMMUNITY): Payer: Managed Care, Other (non HMO) | Attending: Obstetrics and Gynecology

## 2013-01-14 ENCOUNTER — Telehealth: Payer: Self-pay

## 2013-01-18 ENCOUNTER — Ambulatory Visit (INDEPENDENT_AMBULATORY_CARE_PROVIDER_SITE_OTHER): Payer: Managed Care, Other (non HMO) | Admitting: Internal Medicine

## 2013-01-18 VITALS — BP 104/66 | HR 69 | Temp 98.1°F | Resp 18 | Ht 66.5 in | Wt 165.6 lb

## 2013-01-18 DIAGNOSIS — B9689 Other specified bacterial agents as the cause of diseases classified elsewhere: Secondary | ICD-10-CM

## 2013-01-18 DIAGNOSIS — N760 Acute vaginitis: Secondary | ICD-10-CM

## 2013-01-18 DIAGNOSIS — R21 Rash and other nonspecific skin eruption: Secondary | ICD-10-CM

## 2013-01-18 LAB — POCT WET PREP WITH KOH
CLUE CELLS WET PREP PER HPF POC: NEGATIVE
KOH Prep POC: NEGATIVE
Trichomonas, UA: NEGATIVE
YEAST WET PREP PER HPF POC: NEGATIVE

## 2013-01-18 MED ORDER — METRONIDAZOLE 500 MG PO TABS: 500.0000 mg | ORAL_TABLET | Freq: Two times a day (BID) | ORAL | Status: DC

## 2013-01-18 NOTE — Progress Notes (Signed)
   Subjective:    Patient ID: Dionne Bucy, female    DOB: January 09, 1986, 28 y.o.   MRN: [id number]  HPI  28 year old female presents with vaginal burning, itching Vaginal dryness x 2 weeks Itchy and hot beginning yesterday No discharge, no sores Very uncomfortable today No urinary frequency or burning Back pain x 2 weeks  unusual periods x 2 months On sprintec Spotting, last month she had 2 periods Has not missed any bcps. No fever, does have allergies. new chemicals used - lubrigyn when felt onset of dryness  Review of Systems     Objective:   Physical Exam  Vitals reviewed. Constitutional: She is oriented to person, place, and time. She appears well-developed and well-nourished. No distress.  HENT:  Head: Normocephalic.  Eyes: EOM are normal.  Pulmonary/Chest: Effort normal.  Abdominal: Soft. There is no tenderness.  Genitourinary: Vagina normal.    Pelvic exam was performed with patient supine. No labial fusion. There is rash and tenderness on the right labia. There is no lesion or injury on the right labia. There is rash and tenderness on the left labia. There is no lesion or injury on the left labia. No vaginal discharge found.  Neurological: She is alert and oriented to person, place, and time. She has normal reflexes. No cranial nerve deficit. She exhibits normal muscle tone. Coordination normal.  Skin: Rash noted.  Psychiatric: She has a normal mood and affect. Her behavior is normal. Judgment and thought content normal.    Results for orders placed in visit on 01/18/13  POCT WET PREP WITH KOH      Result Value Range   Trichomonas, UA Negative     Clue Cells Wet Prep HPF POC neg     Epithelial Wet Prep HPF POC 1-6     Yeast Wet Prep HPF POC neg     Bacteria Wet Prep HPF POC 4+     RBC Wet Prep HPF POC 2-6     WBC Wet Prep HPF POC 0-2     KOH Prep POC Negative           Assessment & Plan:  Bacterial vaginoses/Flagyl Vaginal external irritation/HC otc  up to 1 week

## 2013-01-18 NOTE — Patient Instructions (Signed)
Atrophic Vaginitis Atrophic vaginitis is a problem of low levels of estrogen in women. This problem can happen at any age. It is most common in women who have gone through menopause ("the change").  HOW WILL I KNOW IF I HAVE THIS PROBLEM? You may have:  Trouble with peeing (urinating), such as:  Going to the bathroom often.  A hard time holding your pee until you reach a bathroom.  Leaking pee.  Having pain when you pee.  Itching or a burning feeling.  Vaginal bleeding and spotting.  Pain during sex.  Dryness of the vagina.  A yellow, bad-smelling fluid (discharge) coming from the vagina. HOW WILL MY DOCTOR CHECK FOR THIS PROBLEM?  During your exam, your doctor will likely find the problem.  If there is a vaginal fluid, it may be checked for infection. HOW WILL THIS PROBLEM BE TREATED? Keep the vulvar skin as clean as possible. Moisturizers and lubricants can help with some of the symptoms. Estrogen replacement can help. There are 2 ways to take estrogen:  Systemic estrogen gets estrogen to your whole body. It takes many weeks or months before the symptoms get better.  You take an estrogen pill.  You use a skin patch. This is a patch that you put on your skin.  If you still have your uterus, your doctor may ask you to take a hormone. Talk to your doctor about the right medicine for you.  Estrogen cream.  This puts estrogen only at the part of your body where you apply it. The cream is put into the vagina or put on the vulvar skin. For some women, estrogen cream works faster than pills or the patch. CAN ALL WOMEN WITH THIS PROBLEM USE ESTROGEN? No. Women with certain types of cancer, liver problems, or problems with blood clots should not take estrogen. Your doctor can help you decide the best treatment for your symptoms. Document Released: 06/15/2007 Document Revised: 03/21/2011 Document Reviewed: 06/15/2007 [facility] Patient Information 2014 [place],  [place]. Vaginitis Vaginitis is an inflammation of the vagina. It is most often caused by a change in the normal balance of the bacteria and yeast that live in the vagina. This change in balance causes an overgrowth of certain bacteria or yeast, which causes the inflammation. There are different types of vaginitis, but the most common types are:  Bacterial vaginosis.  Yeast infection (candidiasis).  Trichomoniasis vaginitis. This is a sexually transmitted infection (STI).  Viral vaginitis.  Atropic vaginitis.  Allergic vaginitis. CAUSES  The cause depends on the type of vaginitis. Vaginitis can be caused by:  Bacteria (bacterial vaginosis).  Yeast (yeast infection).  A parasite (trichomoniasis vaginitis)  A virus (viral vaginitis).  Low hormone levels (atrophic vaginitis). Low hormone levels can occur during pregnancy, breastfeeding, or after menopause.  Irritants, such as bubble baths, scented tampons, and feminine sprays (allergic vaginitis). Other factors can change the normal balance of the yeast and bacteria that live in the vagina. These include:  Antibiotic medicines.  Poor hygiene.  Diaphragms, vaginal sponges, spermicides, birth control pills, and intrauterine devices (IUD).  Sexual intercourse.  Infection.  Uncontrolled diabetes.  A weakened immune system. SYMPTOMS  Symptoms can vary depending on the cause of the vaginitis. Common symptoms include:  Abnormal vaginal discharge.  The discharge is white, gray, or yellow with bacterial vaginosis.  The discharge is thick, white, and cheesy with a yeast infection.  The discharge is frothy and yellow or greenish with trichomoniasis.  A bad vaginal odor.  The  odor is fishy with bacterial vaginosis.  Vaginal itching, pain, or swelling.  Painful intercourse.  Pain or burning when urinating. Sometimes, there are no symptoms. TREATMENT  Treatment will vary depending on the type of infection.   Bacterial  vaginosis and trichomoniasis are often treated with antibiotic creams or pills.  Yeast infections are often treated with antifungal medicines, such as vaginal creams or suppositories.  Viral vaginitis has no cure, but symptoms can be treated with medicines that relieve discomfort. Your sexual partner should be treated as well.  Atrophic vaginitis may be treated with an estrogen cream, pill, suppository, or vaginal ring. If vaginal dryness occurs, lubricants and moisturizing creams may help. You may be told to avoid scented soaps, sprays, or douches.  Allergic vaginitis treatment involves quitting the use of the product that is causing the problem. Vaginal creams can be used to treat the symptoms. HOME CARE INSTRUCTIONS   Take all medicines as directed by your caregiver.  Keep your genital area clean and dry. Avoid soap and only rinse the area with water.  Avoid douching. It can remove the healthy bacteria in the vagina.  Do not use tampons or have sexual intercourse until your vaginitis has been treated. Use sanitary pads while you have vaginitis.  Wipe from front to back. This avoids the spread of bacteria from the rectum to the vagina.  Let air reach your genital area.  Wear cotton underwear to decrease moisture buildup.  Avoid wearing underwear while you sleep until your vaginitis is gone.  Avoid tight pants and underwear or nylons without a cotton panel.  Take off wet clothing (especially bathing suits) as soon as possible.  Use mild, non-scented products. Avoid using irritants, such as:  Scented feminine sprays.  Fabric softeners.  Scented detergents.  Scented tampons.  Scented soaps or bubble baths.  Practice safe sex and use condoms. Condoms may prevent the spread of trichomoniasis and viral vaginitis. SEEK MEDICAL CARE IF:   You have abdominal pain.  You have a fever or persistent symptoms for more than 2 3 days.  You have a fever and your symptoms suddenly  get worse. Document Released: 10/24/2006 Document Revised: 09/21/2011 Document Reviewed: 06/09/2011 [facility] Patient Information 2014 [place].

## 2013-01-18 NOTE — Progress Notes (Signed)
   Subjective:    Patient ID: Summer Shields, female    DOB: 05/10/1985, 28 y.o.   MRN: [id number]  HPI    Review of Systems     Objective:   Physical Exam        Assessment & Plan:

## 2013-02-04 ENCOUNTER — Ambulatory Visit (HOSPITAL_COMMUNITY): Payer: Managed Care, Other (non HMO) | Admitting: Psychiatry

## 2013-03-13 ENCOUNTER — Encounter (HOSPITAL_COMMUNITY): Payer: Self-pay | Admitting: Psychiatry

## 2013-03-13 ENCOUNTER — Ambulatory Visit (INDEPENDENT_AMBULATORY_CARE_PROVIDER_SITE_OTHER): Payer: Managed Care, Other (non HMO) | Admitting: Psychiatry

## 2013-03-13 ENCOUNTER — Encounter (INDEPENDENT_AMBULATORY_CARE_PROVIDER_SITE_OTHER): Payer: Self-pay

## 2013-03-13 VITALS — BP 100/60 | HR 84 | Ht 66.0 in | Wt 172.0 lb

## 2013-03-13 DIAGNOSIS — F3289 Other specified depressive episodes: Secondary | ICD-10-CM

## 2013-03-13 DIAGNOSIS — F329 Major depressive disorder, single episode, unspecified: Secondary | ICD-10-CM

## 2013-03-13 MED ORDER — VENLAFAXINE HCL ER 37.5 MG PO CP24: ORAL_CAPSULE | ORAL | Status: DC

## 2013-03-13 NOTE — Progress Notes (Signed)
[facility] Initial Assessment Note  Summer Shields [id number] 28 y.o.  03/13/2013 2:14 PM  Chief Complaint:  I have a lot of anxiety and depression.  My medicine is not working.  I stop taking Wellbutrin.  History of Present Illness:  Patient is a 28 year old Caucasian employed single female who is referred from her primary care physician for the management of her depression and anxiety symptoms.  Patient endorsed long history of depression and anxiety which has been more intense in recent months.  She endorsed lack of motivation, poor sleep, decreased energy and sometimes fleeting and passive suicidal thoughts.  She also endorsed crying spells and feeling of hopelessness and worthlessness.  Patient told multiple stressors in her life.  She does not like her job.  She is working as an Cabin crew at [employer].  She does not like the environment and she is also concerned that her job maybe eliminated in September because her department is closing.  She is also complaining of bilateral feet pain.  She has plantar fasciitis and recently increased pain has caused a significant impairment in her functions.  She cannot go outside for a longer period of time.  She has difficulty walking.  She will require surgery and she is scheduled to have procedure on March 16.  Patient admitted lack of support from her mother.  Patient admitted feeling tired, poor sleep, racing thoughts and sometimes having panic attack.  She admitted sometimes irritable and angry when she is under a lot of stress.  She also consider herself as a very emotional person and easy to cry when she watched any emotional movies.  Patient denies any paranoia, hallucination, active suicidal thoughts.  However she feels sometimes worthless and hopeless.  She denies any flashbacks but admitted nightmares and dreams.  Patient does not drink or use any illegal substances.  She denies any psychosis or any intent.  She denies  any aggression or any violence.  She denies any obsessions of any compulsive parts.  She started taking medication by primary care physician last year however does not feel any improvement with Prozac which was changed to Wellbutrin a few months ago.  Patient does not see any improvement and she stopped taking the Wellbutrin 2 weeks ago.  She also stopped going to her therapist [name] because she does not see any improvement in her depression and anxiety symptoms.  She is prescribed Xanax 0.5 mg which he takes one to 2 times a week for severe panic attack.  Patient is willing to try any new medication at this time.  Suicidal Ideation: Patient has fleeting suicidal thoughts but no plan. Plan Formed: No Patient has means to carry out plan: No  Homicidal Ideation: No Plan Formed: No Patient has means to carry out plan: No  Past Psychiatric History/Hospitalization(s) Patient denies any history of psychiatric inpatient treatment or any suicidal attempt but endorses history of passive and fleeting suicidal thoughts.  In the past she had tried Prozac and Wellbutrin.  She also remember having depression when she was attending college however she does not see psychiatrist.  She was seen therapist [name] however she stopped constantly because he does not see any improvement.  She denies any history of mania, psychosis or any hallucination. Anxiety: Yes Bipolar Disorder: No Depression: Yes Mania: No Psychosis: No Schizophrenia: No Personality Disorder: No Hospitalization for psychiatric illness: No History of Electroconvulsive Shock Therapy: No Prior Suicide Attempts: No  Medical History; Patient has plantar  fasciitis, fibroadenoma and environmental allergies.  She is seeing physician at [facility] practice.  Traumatic brain injury: Patient denies any traumatic brain injury.  Family History; Patient endorsed that her family has history of alcoholism.  She endorsed her brother and  father has drinking issues.  Education and Work History; Patient has college education.  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  She does not like her job.  Psychosocial History; Patient born and raised in [place].  Her parents are divorced.  Patient is single and she lives with her younger brother.  She has a steady relationship with a boyfriend.  Patient has no children.  She is very close to her 2 half siblings.    Legal History; Patient denies any legal issues.  History Of Abuse; Patient denies any history of physical sexual or verbal abuse.  Substance Abuse History; Patient denies any history of heavy drinking but admitted to occasional drinking but no withdrawal symptoms.   Review of Systems: Psychiatric: Agitation: No Hallucination: No Depressed Mood: Yes Insomnia: Yes Hypersomnia: No Altered Concentration: No Feels Worthless: Yes Grandiose Ideas: No Belief In Special Powers: No New/Increased Substance Abuse: No Compulsions: No  Neurologic: Headache: Yes Seizure: No Paresthesias: No    Outpatient Encounter Prescriptions as of 03/13/2013  Medication Sig  . ALPRAZolam (XANAX) 0.5 MG tablet Take 1 tablet (0.5 mg total) by mouth 2 (two) times daily as needed for anxiety.  Marland Kitchen buPROPion (WELLBUTRIN XL) 150 MG 24 hr tablet TAKE 1 TABLET BY MOUTH EVERY DAY MAY INCREASE TO TAKE 2 TABLETS EVERY DAY AFTER 1 WEEK  . fluticasone (FLONASE) 50 MCG/ACT nasal spray Place 2 sprays into the nose daily.  . metroNIDAZOLE (FLAGYL) 500 MG tablet Take 1 tablet (500 mg total) by mouth 2 (two) times daily with a meal.  . norgestimate-ethinyl estradiol (ORTHO-CYCLEN,SPRINTEC,PREVIFEM) 0.25-35 MG-MCG tablet Take 1 tablet by mouth daily.    Recent Results (from the past 2160 hour(s))  POCT WET PREP WITH KOH     Status: None   Collection Time    01/18/13  3:46 PM      Result Value Ref Range   Trichomonas, UA Negative     Clue Cells Wet Prep HPF POC  neg     Epithelial Wet Prep HPF POC 1-6     Yeast Wet Prep HPF POC neg     Bacteria Wet Prep HPF POC 4+     RBC Wet Prep HPF POC 2-6     WBC Wet Prep HPF POC 0-2     KOH Prep POC Negative        Physical Exam: Constitutional:  There were no vitals taken for this visit.  Musculoskeletal: Strength & Muscle Tone: within normal limits Gait & Station: Patient has chronic pain in her feet and she has difficulty walking. Patient leans: N/A  Mental Status Examination;  Patient is a young female who is casually dressed and well groomed.  She appears anxious but cooperative.  She maintained good eye contact.  She described her mood as very anxious depressed and her affect is mood appropriate.  She uses a lot of humor in her conversation.  She admitted passive and fleeting suicidal thoughts but denies any plan or any intent.  She denies any auditory or visual hallucination.  There were no paranoia, delusions or any obsessive thoughts.  Her attention and concentration is fair.  There were no flight of ideas or any loose association.  Her fund of  knowledge is adequate.  Her speech is fluent and coherent.  Her psychomotor activity is slightly decreased.  She is alert and oriented x3.  There were no tremors or shakes.  Her insight judgment and impulse control is okay.   New problem, with additional work up planned, Review of Psycho-Social Stressors (1), Review or order clinical lab tests (1), Decision to obtain old records (1), Review and summation of old records (2), Established Problem, Worsening (2), Review of Medication Regimen & Side Effects (2) and Review of New Medication or Change in Dosage (2)  Assessment: Axis I: Depressive disorder NOS, rule out major depressive disorder recurrent  Axis II: Deferred  Axis III:  Past Medical History  Diagnosis Date  . Plantar fasciitis   . Fibroadenoma   . Environmental allergies   . Anxiety   . Depression   . Allergy     Axis IV:  Moderate   Plan:  I review her symptoms, collateral information, the stressors in her current medication.  Patient is experiencing increased anxiety and depression.  She had tried so far Prozac and Wellbutrin with limited response.  She does not see any improvement in counseling.  I recommended to try Effexor 37.5 mg daily for one week and gradually increase to 2 a day.  I discussed in detail the risks and benefits of medication.  I recommended to continue Xanax 0.5 mg for severe panic attack and anxiety.  Reassurance given.  Patient is scheduled to have foot surgery in 2 weeks.  I will see her again in 3 weeks.  I recommended to cause back if she has any question or any concern.Time spent 25 minutes.  More than 50% of the time spent in psychoeducation, counseling and coordination of care.  Discuss safety plan that anytime having active suicidal thoughts or homicidal thoughts then patient need to call 911 or go to the local emergency room.    Asuncion Shibata T., MD 03/13/2013

## 2013-04-03 ENCOUNTER — Ambulatory Visit (HOSPITAL_COMMUNITY): Payer: Self-pay | Admitting: Psychiatry

## 2013-04-05 ENCOUNTER — Encounter (HOSPITAL_COMMUNITY): Payer: Self-pay | Admitting: Psychiatry

## 2013-04-05 ENCOUNTER — Ambulatory Visit (INDEPENDENT_AMBULATORY_CARE_PROVIDER_SITE_OTHER): Payer: Managed Care, Other (non HMO) | Admitting: Psychiatry

## 2013-04-05 VITALS — BP 105/76 | HR 95 | Wt 173.0 lb

## 2013-04-05 DIAGNOSIS — F329 Major depressive disorder, single episode, unspecified: Secondary | ICD-10-CM

## 2013-04-05 DIAGNOSIS — F3289 Other specified depressive episodes: Secondary | ICD-10-CM

## 2013-04-05 MED ORDER — VENLAFAXINE HCL ER 75 MG PO CP24: 75.0000 mg | ORAL_CAPSULE | Freq: Every day | ORAL | Status: DC

## 2013-04-05 NOTE — Progress Notes (Addendum)
[facility] Progress Note   Summer Shields [id number] 28 y.o.  04/05/2013 11:01 AM  Chief Complaint:  I am less depressed.    History of Present Illness:  Summer Shields came for her followup appointment.  She was seen first time on March 4th for initial evaluation.  She was referred from primary care physician for management of depression and anxiety symptoms.  We started her on Effexor .  She is taking 37.5 mg twice a day.  She feels less depressed and less tearful.  Her sleep is improved.  She admitted feeling crying spells and denies any feeling of hopelessness and worthlessness.  She denies any tremors or shakes.  She continued to endorse a stressful work.  She is working overtime.  She scheduled to have foot surgery this coming Monday.  She appears anxious and nervous but she is also hoping improvement after the surgery.  She is sleeping better.  Her energy remains sometime low.  She has taken 3-4 times next half tablet which help her panic attack.  She has 2-3 panic attacks since she was the last time.  She is using melatonin for sleep.  She was given pain medication because of poor for pain however she has not started yet.  She will be out of work for another 4 months after her surgery.  Patient has plantar fasciitis which has caused significant impairment in her daily life.  She has significant pain and difficulty moving around.  Patient endorsed she has gained weight since she is unable to do exercise.  Patient is working as an Cabin crew at [employer].  She continued to endorse difficult job environment .  Our department is closing and she is concerned about the future.  She is not seeing therapist.  She wants to try a different therapist.  She used to see Summer Shields but she was not happy with her.  Patient denies any agitation, anger, mood swing.  She is taking Xanax 0.5 mg half tablet only as needed for severe panic attack.  Patient does not endorse any  suicidal thoughts or homicidal thoughts.  She did not endorse any paranoia or any hallucination.  She is not drinking or using any illicit substances.  Suicidal Ideation: No Plan Formed: No Patient has means to carry out plan: No  Homicidal Ideation: No Plan Formed: No Patient has means to carry out plan: No  Past Psychiatric History/Hospitalization(s) Patient denies any history of psychiatric inpatient treatment or any suicidal attempt but endorses history of passive and fleeting suicidal thoughts.  In the past she had tried Prozac and Wellbutrin.  She also remember having depression when she was attending college however she does not see psychiatrist.  She was seen therapist Summer Shields however she stopped constantly because he does not see any improvement.  She denies any history of mania, psychosis or any hallucination. Anxiety: Yes Bipolar Disorder: No Depression: Yes Mania: No Psychosis: No Schizophrenia: No Personality Disorder: No Hospitalization for psychiatric illness: No History of Electroconvulsive Shock Therapy: No Prior Suicide Attempts: No  Medical History; Patient has plantar fasciitis, fibroadenoma and environmental allergies.  She is seeing physician at [facility] practice.  Psychosocial History; Patient born and raised in [place].  Her parents are divorced.  Patient is single and she lives with her younger brother.  She has a steady relationship with a boyfriend.  Patient has no children.  She is very close to her 2 half siblings.    Review  of Systems: Psychiatric: Agitation: No Hallucination: No Depressed Mood: No Insomnia: No Hypersomnia: No Altered Concentration: No Feels Worthless: No Grandiose Ideas: No Belief In Special Powers: No New/Increased Substance Abuse: No Compulsions: No  Neurologic: Headache: Yes Seizure: No Paresthesias: No    Outpatient Encounter Prescriptions as of 04/05/2013  Medication Sig  . ALPRAZolam (XANAX) 0.5 MG  tablet Take 1 tablet (0.5 mg total) by mouth 2 (two) times daily as needed for anxiety.  . norgestimate-ethinyl estradiol (ORTHO-CYCLEN,SPRINTEC,PREVIFEM) 0.25-35 MG-MCG tablet Take 1 tablet by mouth daily.  Marland Kitchen venlafaxine XR (EFFEXOR XR) 75 MG 24 hr capsule Take 1 capsule (75 mg total) by mouth daily.  . [DISCONTINUED] venlafaxine XR (EFFEXOR XR) 37.5 MG 24 hr capsule Take 1 capsule for 1 week and than 2 daily  . fluticasone (FLONASE) 50 MCG/ACT nasal spray Place 2 sprays into the nose daily.    Recent Results (from the past 2160 hour(s))  POCT WET PREP WITH KOH     Status: None   Collection Time    01/18/13  3:46 PM      Result Value Ref Range   Trichomonas, UA Negative     Clue Cells Wet Prep HPF POC neg     Epithelial Wet Prep HPF POC 1-6     Yeast Wet Prep HPF POC neg     Bacteria Wet Prep HPF POC 4+     RBC Wet Prep HPF POC 2-6     WBC Wet Prep HPF POC 0-2     KOH Prep POC Negative        Physical Exam: Constitutional:  BP 105/76  Pulse 95  Wt 173 lb (78.472 kg)  Musculoskeletal: Strength & Muscle Tone: within normal limits Gait & Station: Patient has chronic pain in her feet and she has difficulty walking. Patient leans: N/A  Mental Status Examination;  Patient is a young female who is casually dressed and well groomed.  She appears anxious but cooperative.  She maintained good eye contact.  She described her mood nervous and her affect is mood appropriate.  She denies any active or passive suicidal thoughts or homicidal thoughts.  She denies any auditory or visual hallucination.  There were no paranoia, delusions or any obsessive thoughts.  Her attention and concentration is fair.  There were no flight of ideas or any loose association.  Her fund of knowledge is adequate.  Her speech is fluent and coherent.  Her psychomotor activity is slightly decreased.  She is alert and oriented x3.  There were no tremors or shakes.  Her insight judgment and impulse control is  okay.   Established Problem, Stable/Improving (1), Review of Psycho-Social Stressors (1), Review of Last Therapy Session (1), Review of Medication Regimen & Side Effects (2) and Review of New Medication or Change in Dosage (2)  Assessment: Axis I: Depressive disorder NOS, rule out major depressive disorder recurrent  Axis II: Deferred  Axis III:  Past Medical History  Diagnosis Date  . Plantar fasciitis   . Fibroadenoma   . Environmental allergies   . Anxiety   . Depression   . Allergy     Axis IV: Moderate   Plan:  Patient is doing better on Effexor 37.5 mg to capsule daily.  She does not have any side effects including any tremors or shakes.  She is scheduled to have foot surgery this coming Monday and after that she will require 4 months recovery. Recommended to take Effexor 75 mg daily .  I  will also schedule a therapist for coping and social skills.  Recommended to use Xanax 0.5 mg half tablet only for severe panic attack.  She still has trouble remaining.  Discussed risks and benefits of medication.  Recommended to call us back if she has any question on any concern.  Followup in 4 weeks. Time spent 25 minutes.  More than 50% of the time spent in psychoeducation, counseling and coordination of care.  Discuss safety plan that anytime having active suicidal thoughts or homicidal thoughts then patient need to call 911 or go to the local emergency room.    ARFEEN,SYED T., MD 04/05/2013

## 2013-05-06 ENCOUNTER — Encounter (INDEPENDENT_AMBULATORY_CARE_PROVIDER_SITE_OTHER): Payer: Self-pay

## 2013-05-06 ENCOUNTER — Encounter (HOSPITAL_COMMUNITY): Payer: Self-pay | Admitting: Psychiatry

## 2013-05-06 ENCOUNTER — Ambulatory Visit (INDEPENDENT_AMBULATORY_CARE_PROVIDER_SITE_OTHER): Payer: Managed Care, Other (non HMO) | Admitting: Psychiatry

## 2013-05-06 VITALS — BP 122/78 | HR 105 | Ht 66.0 in | Wt 172.0 lb

## 2013-05-06 DIAGNOSIS — F3289 Other specified depressive episodes: Secondary | ICD-10-CM

## 2013-05-06 DIAGNOSIS — F329 Major depressive disorder, single episode, unspecified: Secondary | ICD-10-CM

## 2013-05-06 NOTE — Progress Notes (Signed)
Summer Shields [phone] Progress Note   Summer Shields [id number] 28 y.o.  05/06/2013 2:32 PM  Chief Complaint:  I am feeling better.   History of Present Illness:  Summer Shields came for her followup appointment.  On her last visit we increased her Effexor to 75 mg by mouth she is feeling better.  She is less anxious and less depressed.  She has foot surgery and she is getting cast.  She is hoping to get better in 2 weeks.  Her cast may remove on May 7.  Overall she is less anxious.  She has one panic attack.  She is sleeping better.  She denies any crying spells.  She continues to take Xanax half to one tablet 2-3 times for anxiety.  Her energy level is improved.  She is concerned about her job which may eliminate in September.  She was to look for another job as soon as possible.  Patient is scheduled to see therapist tomorrow in this office.  Patient is on any tremors or shakes.  She denies any suicidal thoughts or homicidal thoughts she has no paranoia or any hallucination.  Suicidal Ideation: No Plan Formed: No Patient has means to carry out plan: No  Homicidal Ideation: No Plan Formed: No Patient has means to carry out plan: No  Past Psychiatric History/Hospitalization(s) Patient denies any history of psychiatric inpatient treatment or any suicidal attempt but endorses history of passive and fleeting suicidal thoughts.  In the past she had tried Prozac and Wellbutrin.  She also remember having depression when she was attending college however she does not see psychiatrist.  She was seen therapist Summer Shields however she stopped constantly because he does not see any improvement.  She denies any history of mania, psychosis or any hallucination. Anxiety: Yes Bipolar Disorder: No Depression: Yes Mania: No Psychosis: No Schizophrenia: No Personality Disorder: No Hospitalization for psychiatric illness: No History of Electroconvulsive Shock Therapy: No Prior Suicide Attempts:  No  Medical History; Patient has plantar fasciitis, fibroadenoma and environmental allergies.  She is seeing physician at [facility] practice.  Psychosocial History; Patient born and raised in [place].  Her parents are divorced.  Patient is single and she lives with her younger brother.  She has a steady relationship with a boyfriend.  Patient has no children.  She is very close to her 2 half siblings.    Review of Systems: Psychiatric: Agitation: No Hallucination: No Depressed Mood: No Insomnia: No Hypersomnia: No Altered Concentration: No Feels Worthless: No Grandiose Ideas: No Belief In Special Powers: No New/Increased Substance Abuse: No Compulsions: No  Neurologic: Headache: Yes Seizure: No Paresthesias: No    Outpatient Encounter Prescriptions as of 05/06/2013  Medication Sig  . ALPRAZolam (XANAX) 0.5 MG tablet Take 1 tablet (0.5 mg total) by mouth 2 (two) times daily as needed for anxiety.  Marland Kitchen venlafaxine XR (EFFEXOR XR) 75 MG 24 hr capsule Take 1 capsule (75 mg total) by mouth daily.  . fluticasone (FLONASE) 50 MCG/ACT nasal spray Place 2 sprays into the nose daily.  . norgestimate-ethinyl estradiol (ORTHO-CYCLEN,SPRINTEC,PREVIFEM) 0.25-35 MG-MCG tablet Take 1 tablet by mouth daily.    No results found for this or any previous visit (from the past 2160 hour(s)).    Physical Exam: Constitutional:  BP 122/78  Pulse 105  Ht 5\' 6"  (1.676 m)  Wt 172 lb (78.019 kg)  BMI 27.77 kg/m2  Musculoskeletal: Strength & Muscle Tone: within normal limits Gait & Station: Patient has chronic pain in  her feet and she has difficulty walking. Patient leans: N/A  Mental Status Examination;  Patient is a young female who is casually dressed and well groomed.  She is cooperative.  She maintained good eye contact.  She described her mood better and her affect is improved.  She denies any active or passive suicidal thoughts or homicidal thoughts.  She denies any auditory or  visual hallucination.  There were no paranoia, delusions or any obsessive thoughts.  Her attention and concentration is fair.  There were no flight of ideas or any loose association.  Her fund of knowledge is adequate.  Her speech is fluent and coherent.  Her psychomotor activity is slightly decreased.  She is alert and oriented x3.  There were no tremors or shakes.  Her insight judgment and impulse control is okay.   Established Problem, Stable/Improving (1), Review of Psycho-Social Stressors (1), Review of Last Therapy Session (1) and Review of Medication Regimen & Side Effects (2)  Assessment: Axis I: Depressive disorder NOS, rule out major depressive disorder recurrent  Axis II: Deferred  Axis III:  Past Medical History  Diagnosis Date  . Plantar fasciitis   . Fibroadenoma   . Environmental allergies   . Anxiety   . Depression   . Allergy     Axis IV: Moderate   Plan:  Patient is doing better on Effexor 75 mg daily.  She does not have any side effects including any tremors or shakes.  She is scheduled to see therapist tomorrow.  Recommended to continue Xanax 0.5 mg half tablet only for severe panic attack.  Discussed risks and benefits of medication.  Recommended to call us back if she has any question on any concern.  Followup in 6 weeks.   Summer Shields T., MD 05/06/2013

## 2013-05-07 ENCOUNTER — Ambulatory Visit (INDEPENDENT_AMBULATORY_CARE_PROVIDER_SITE_OTHER): Payer: Managed Care, Other (non HMO) | Admitting: Psychology

## 2013-05-07 ENCOUNTER — Encounter (HOSPITAL_COMMUNITY): Payer: Self-pay | Admitting: Psychology

## 2013-05-07 DIAGNOSIS — F3289 Other specified depressive episodes: Secondary | ICD-10-CM

## 2013-05-07 DIAGNOSIS — F329 Major depressive disorder, single episode, unspecified: Secondary | ICD-10-CM

## 2013-05-10 DIAGNOSIS — F329 Major depressive disorder, single episode, unspecified: Secondary | ICD-10-CM | POA: Insufficient documentation

## 2013-05-10 NOTE — Progress Notes (Signed)
Summer Shields is a 28 y.o. female patient referred by Dr. Adele Schilder for counseling for depression and anxiety.  Patient:   Summer Shields   DOB:   January 22, 1985  MR Number:  [id number]  Location:  [facility] [address] [id number] [place] [address] Dept: [phone]           Date of Service:   05/07/13  Start Time:   12.30 End Time:   1.30pm  Provider/Observer:  Jan Fireman [facility]       Billing Code/Service: [phone]  Chief Complaint:     Chief Complaint  Patient presents with  . Depression    Reason for Service:  Pt is referred for counseling by Dr. Adele Schilder who is treating pt for Depressive D/O NOS.  Pt reports experiencing first depression when 28y/o, but didn't seek tx at the time.  Pt reported over the past year depressive symptoms have returned and she was being tx by PCP and counselor, Almyra Free- however wasn't seeing improvement so started w/ Dr. Adele Schilder in 2015 as was tired of crying all the time and loss of interest. Pt also endorses some hx of anxiety and panic attacks feeling that first panic attack was in middle school.    Current Status:  Pt reports depression and anxiety have improved, but still dealing w/ some depressed days and anxious days.  She discussed the stressors of relationship w/ mom that is strained as mom not very supportive and job conflict that occurred between her boyfriend and mom.  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  Pt also reports poly relationship w/ her boyfriend and his other two partners he is dating has been recent strain as feels disrespected by one of the women.   Reliability of Information: Pt provided information and Dr. Marguerite Olea records reviewed.   Behavioral Observation: [name]  presents as a 28 y.o.-year-old  Caucasian Female who appeared her stated age. her dress was Appropriate and she  was Well Groomed and her manners were Appropriate to the situation.  There were not any physical disabilities noted.  she displayed an appropriate level of cooperation and motivation.    Interactions:    Active   Attention:   within normal limits  Memory:   within normal limits  Visuo-spatial:   not examined  Speech (Volume):  normal  Speech:   normal pitch and normal volume  Thought Process:  Coherent and Relevant  Though Content:  WNL  Orientation:   person, place, time/date and situation  Judgment:   Good  Planning:   Good  Affect:    Appropriate  Mood:    Depressed  Insight:   Good  Intelligence:   normal  Marital Status/Living: Pt lives in her own apartment and her younger 27y/o brother came to stay with her in November 2014.  Pt is not married.  She is in a open poly relationship w/ her boyfriend of 4 years and he is also in relationship w/ 2 other women.  Pt grew up w/ mother and brother and remains in contact w/ father as well.  Pt reports strained relationship w/ mom.  Pt reports supports as boyfriend and friendships.    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  Substance Use:  No concerns of substance abuse are reported.  Pt denies use of drugs.  Only occassional use of alcohol.   Education:   Pt H.S diploma  Medical History:   Past Medical History  Diagnosis Date  . Plantar fasciitis   . Fibroadenoma   . Environmental allergies   . Anxiety   . Depression   . Allergy         Outpatient Encounter Prescriptions as of 05/07/2013  Medication Sig  . ALPRAZolam (XANAX) 0.5 MG tablet Take 1 tablet (0.5 mg total) by mouth 2 (two) times daily as needed for anxiety.  . fluticasone (FLONASE) 50 MCG/ACT nasal spray Place 2 sprays into the nose daily.  . norgestimate-ethinyl estradiol  (ORTHO-CYCLEN,SPRINTEC,PREVIFEM) 0.25-35 MG-MCG tablet Take 1 tablet by mouth daily.  Marland Kitchen venlafaxine XR (EFFEXOR XR) 75 MG 24 hr capsule Take 1 capsule (75 mg total) by mouth daily.        Taking as prescribed  Sexual History:   History  Sexual Activity  . Sexual Activity: Yes  . Birth Control/ Protection: Pill    Abuse/Trauma History: None reported  Psychiatric History:  Pt saw counselor Almyra Free for about 6-7 months and stopped going as didn't feel progress or that was effective.  Family Med/Psych History:  Family History  Problem Relation Age of Onset  . Diabetes Maternal Grandfather   . Hypertension Maternal Grandfather   . Stroke Maternal Grandfather   . Depression Father     Risk of Suicide/Violence: virtually non-existent no hx of Self harm, threats of suicide or aggression.  Impression/DX:  Pt is a 27y/o female who presents for counseling as referred by Dr. Adele Schilder.  Pt is dx w/ Depressive D/O NOS and reports improvement in her depression and anxiety but still needing further support to make progress w/ depression and cope w/ current stressors.  Pt is motivated and good insight.  Disposition/Plan:  F/u in 2 weeks w/ counseling.  Continue tx w/ Dr. Adele Schilder.   Diagnosis:     Depressive disorder, not elsewhere classified      .        Jan Fireman, LPC

## 2013-06-07 ENCOUNTER — Ambulatory Visit (HOSPITAL_COMMUNITY): Payer: Self-pay | Admitting: Psychology

## 2013-06-17 ENCOUNTER — Ambulatory Visit (HOSPITAL_COMMUNITY): Payer: Self-pay | Admitting: Psychiatry

## 2013-06-18 ENCOUNTER — Encounter (HOSPITAL_COMMUNITY): Payer: Self-pay | Admitting: Psychiatry

## 2013-06-18 ENCOUNTER — Ambulatory Visit (INDEPENDENT_AMBULATORY_CARE_PROVIDER_SITE_OTHER): Payer: Managed Care, Other (non HMO) | Admitting: Psychiatry

## 2013-06-18 VITALS — BP 123/80 | HR 88 | Ht 66.5 in | Wt 175.0 lb

## 2013-06-18 DIAGNOSIS — F329 Major depressive disorder, single episode, unspecified: Secondary | ICD-10-CM

## 2013-06-18 DIAGNOSIS — F3289 Other specified depressive episodes: Secondary | ICD-10-CM

## 2013-06-18 MED ORDER — VENLAFAXINE HCL ER 37.5 MG PO CP24: ORAL_CAPSULE | ORAL | Status: DC

## 2013-06-18 NOTE — Progress Notes (Signed)
[place] Progress Note   Summer Shields [id number] 28 y.o.  06/18/2013 11:53 AM  Chief Complaint:  I am still  very anxious and depressed.  History of Present Illness:  Summer Shields came for her followup appointment.  She is taking Effexor 75 mg daily .  She continued to endorse anxiety and nervousness.  She feels sometimes very hopeless and helpless and admitted passive suicidal thoughts but no plan.  She is going to physical therapy and she is relieved that her cast is removed however she still has pain and she scheduled to see her physician tomorrow .  She may need a second surgery on her foot .  She's not applying for a job because she is not sure when she will be fully recovered from her surgery .  She admitted anxiety about her future .  She has few panic attack and she taken Xanax which is prescribed by her primary care physician.  She still has refill remaining on it.  She denies any side effects of Effexor.  She admitted Effexor helped her anxiety but she still have moments of hopelessness and worthlessness.  She saw Summer Shields for counseling in this office but she does not feel that she clicked very well with her.  She mentioned that she does not need counseling or therapy at this time.  Her appetite is okay.  She sleeping on and off.  She admitted some time crying spells but denies any agitation, anger, mood swings or any paranoia.  She has no tremors or shakes.  She reported her energy level is same .  She is willing to try a higher dose of Effexor.  She is not drinking or using any illegal substances.  She lives with her younger brother and she had a good supporting relationship with her boyfriend.    Suicidal Ideation: Passive and fleeting thoughts Plan Formed: No Patient has means to carry out plan: No  Homicidal Ideation: No Plan Formed: No Patient has means to carry out plan: No  Past Psychiatric History/Hospitalization(s) Patient denies any history of  psychiatric inpatient treatment or any suicidal attempt but endorses history of passive and fleeting suicidal thoughts.  In the past she had tried Prozac and Wellbutrin.  She also remember having depression when she was attending college however she did not saw psychiatrist.  She was seen therapist Summer Shields however she stopped constantly because she does not see any improvement.  She denies any history of mania, psychosis or any hallucination.  Recently she saw once Summer Shields but does not feel that she clicked very well with her. Anxiety: Yes Bipolar Disorder: No Depression: Yes Mania: No Psychosis: No Schizophrenia: No Personality Disorder: No Hospitalization for psychiatric illness: No History of Electroconvulsive Shock Therapy: No Prior Suicide Attempts: No  Medical History; Patient has plantar fasciitis, fibroadenoma and environmental allergies.  She is seeing physician at [facility] practice.  Psychosocial History; Patient born and raised in [place].  Her parents are divorced.  Patient is single and she lives with her younger brother.  She has a steady relationship with a boyfriend.  Patient has no children.  She is very close to her 2 half siblings.    Review of Systems: Psychiatric: Agitation: No Hallucination: No Depressed Mood: No Insomnia: No Hypersomnia: No Altered Concentration: No Feels Worthless: No Grandiose Ideas: No Belief In Special Powers: No New/Increased Substance Abuse: No Compulsions: No  Neurologic: Headache: Yes Seizure: No Paresthesias: No    Outpatient  Encounter Prescriptions as of 06/18/2013  Medication Sig  . ALPRAZolam (XANAX) 0.5 MG tablet Take 1 tablet (0.5 mg total) by mouth 2 (two) times daily as needed for anxiety.  . fluticasone (FLONASE) 50 MCG/ACT nasal spray Place 2 sprays into the nose daily.  . norgestimate-ethinyl estradiol (ORTHO-CYCLEN,SPRINTEC,PREVIFEM) 0.25-35 MG-MCG tablet Take 1 tablet by mouth daily.  Marland Kitchen  venlafaxine XR (EFFEXOR-XR) 37.5 MG 24 hr capsule Take 3 capsule daily  . [DISCONTINUED] venlafaxine XR (EFFEXOR XR) 75 MG 24 hr capsule Take 1 capsule (75 mg total) by mouth daily.    No results found for this or any previous visit (from the past 2160 hour(s)).    Physical Exam: Constitutional:  BP 123/80  Pulse 88  Ht 5' 6.5" (1.689 m)  Wt 175 lb (79.379 kg)  BMI 27.83 kg/m2  Musculoskeletal: Strength & Muscle Tone: within normal limits Gait & Station: normal Patient leans: N/A  Mental Status Examination;  Patient is a young female who is casually dressed and well groomed.  She is anxious but cooperative.  She maintained fair eye contact.  She describes her mood as anxious and her affect is mood appropriate.  She endorsed passive and fleeting suicidal thoughts but no plan.  She denies any homicidal thoughts.  She denies any auditory or visual hallucination.  There were no paranoia, delusions or any obsessive thoughts.  Her attention and concentration is fair.  There were no flight of ideas or any loose association.  Her fund of knowledge is adequate.  Her speech is fluent and coherent.  Her psychomotor activity is slightly decreased.  She is alert and oriented x3.  There were no tremors or shakes.  Her insight judgment and impulse control is okay.   Established Problem, Stable/Improving (1), Review of Psycho-Social Stressors (1), Review of Last Therapy Session (1) and Review of Medication Regimen & Side Effects (2)  Assessment: Axis I: Depressive disorder NOS, rule out major depressive disorder recurrent  Axis II: Deferred  Axis III:  Past Medical History  Diagnosis Date  . Plantar fasciitis   . Fibroadenoma   . Environmental allergies   . Anxiety   . Depression   . Allergy     Axis IV: Moderate   Plan:  I recommended to increase Effexor 112.5 daily to help her depression and anxiety symptoms.  Patient is tolerating the Effexor without any side effects.  She still has  Xanax refill remaining which is prescribed by her primary care physician.  She does not want to try a different medication at this time.  I encourage to see a different therapist but patient declined to see counselor at this time.  She is anxious about her foot and may require second surgery .  Discuss in detail the risks and benefits of medication.  Recommended to call us back if she has any question or any concern.  Followup in 6 weeks.  Time spent 25 minutes.  More than 50% of the time spent in psychoeducation, counseling and coordination of care.  Discuss safety plan that anytime having active suicidal thoughts or homicidal thoughts then patient need to call 911 or go to the local emergency room.   Janye Maynor T., MD 06/18/2013

## 2013-07-30 ENCOUNTER — Ambulatory Visit (HOSPITAL_COMMUNITY): Payer: Self-pay | Admitting: Psychiatry

## 2013-09-09 ENCOUNTER — Other Ambulatory Visit (HOSPITAL_COMMUNITY): Payer: Self-pay | Admitting: Psychiatry

## 2013-09-11 ENCOUNTER — Other Ambulatory Visit (HOSPITAL_COMMUNITY): Payer: Self-pay | Admitting: Psychiatry

## 2013-09-11 NOTE — Telephone Encounter (Signed)
Patient needs appointment.

## 2013-09-12 ENCOUNTER — Telehealth (HOSPITAL_COMMUNITY): Payer: Self-pay

## 2013-09-12 ENCOUNTER — Other Ambulatory Visit (HOSPITAL_COMMUNITY): Payer: Self-pay | Admitting: Psychiatry

## 2013-09-12 DIAGNOSIS — F3289 Other specified depressive episodes: Secondary | ICD-10-CM

## 2013-09-12 DIAGNOSIS — F329 Major depressive disorder, single episode, unspecified: Secondary | ICD-10-CM

## 2013-09-12 MED ORDER — VENLAFAXINE HCL ER 37.5 MG PO CP24: ORAL_CAPSULE | ORAL | Status: DC

## 2013-09-18 ENCOUNTER — Telehealth (HOSPITAL_COMMUNITY): Payer: Self-pay

## 2013-10-13 ENCOUNTER — Ambulatory Visit (INDEPENDENT_AMBULATORY_CARE_PROVIDER_SITE_OTHER): Payer: Managed Care, Other (non HMO) | Admitting: Physician Assistant

## 2013-10-13 VITALS — BP 100/74 | HR 100 | Temp 98.0°F | Resp 12 | Ht 66.5 in | Wt 188.4 lb

## 2013-10-13 DIAGNOSIS — J309 Allergic rhinitis, unspecified: Secondary | ICD-10-CM

## 2013-10-13 DIAGNOSIS — R05 Cough: Secondary | ICD-10-CM

## 2013-10-13 DIAGNOSIS — R059 Cough, unspecified: Secondary | ICD-10-CM

## 2013-10-13 DIAGNOSIS — R062 Wheezing: Secondary | ICD-10-CM

## 2013-10-13 DIAGNOSIS — J069 Acute upper respiratory infection, unspecified: Secondary | ICD-10-CM

## 2013-10-13 MED ORDER — ALBUTEROL SULFATE (2.5 MG/3ML) 0.083% IN NEBU
2.5000 mg | INHALATION_SOLUTION | Freq: Once | RESPIRATORY_TRACT | Status: AC
  Administered 2013-10-13: 2.5 mg via RESPIRATORY_TRACT

## 2013-10-13 MED ORDER — ALBUTEROL SULFATE HFA 108 (90 BASE) MCG/ACT IN AERS: 2.0000 | INHALATION_SPRAY | RESPIRATORY_TRACT | Status: DC | PRN

## 2013-10-13 MED ORDER — FLUTICASONE PROPIONATE 50 MCG/ACT NA SUSP: 2.0000 | Freq: Every day | NASAL | Status: DC

## 2013-10-13 MED ORDER — GUAIFENESIN ER 1200 MG PO TB12: 1.0000 | ORAL_TABLET | Freq: Two times a day (BID) | ORAL | Status: DC | PRN

## 2013-10-13 MED ORDER — IPRATROPIUM BROMIDE 0.03 % NA SOLN: 2.0000 | Freq: Two times a day (BID) | NASAL | Status: DC

## 2013-10-13 MED ORDER — IPRATROPIUM BROMIDE 0.02 % IN SOLN
0.5000 mg | Freq: Once | RESPIRATORY_TRACT | Status: AC
  Administered 2013-10-13: 0.5 mg via RESPIRATORY_TRACT

## 2013-10-13 MED ORDER — BENZONATATE 100 MG PO CAPS: 100.0000 mg | ORAL_CAPSULE | Freq: Three times a day (TID) | ORAL | Status: DC | PRN

## 2013-10-13 NOTE — Patient Instructions (Signed)
Get plenty of rest and drink at least 64 ounces of water daily.  THE 3 SIMPLE RULES FOR NASAL SPRAY USE: 1. Don't snort. 2. Look at your toes and spray up your nose. 3. Use the opposite hand to spray in both nostrils.  Once your symptoms are improved, you may STOP the Atrovent (ipratropium) nasal spray, but continue the Flonase for maintenance of your allergies. If you cannot tolerate the nasal spray, even with the new technique, let us know. We would consider Singulair.

## 2013-10-13 NOTE — Progress Notes (Signed)
Subjective:    Patient ID: Summer Shields, female    DOB: 04-29-1985, 29 y.o.   MRN: [id number]  HPI  15 yof with no significant PMH. About 10 days ago she first noticed congestion. She is allergic to lot of things and thought this may be the cause of her congestion. She therefore changed home air filters but no improvement. The last two nights the congestion has worsened and she has felt some trouble breathing and had some mild chest heaviness. She has also had a nonproductive cough for the past 2 days. She has tried both claritin and Human resources officer which have not worked. She has not tried any decongestants. She denies any sore throat or ear pain. She has been nauseated several times over the 10 days but no vomiting. She also mentions intermittent abd pain over the past [redacted] weeks along with non bloody diarrhea most days during this two week period. She has had no normal stools during this time. She has not taken anything for the diarrhea as she does not feel it is very bad. This abd pain and diarrhea occurred shortly after completing pain medication regimen for recent surgeries she had for plantar fascitis.   She also mentions that she is interested in getting a prescription for an allergy medicine. She has used flonase in the past but does not like the nasal spray for comfort reasons and would like to try a daily oral agent.    Review of Systems  No fever, chills. No chest pain.     Objective:   Physical Exam  BP 100/74  Pulse 100  Temp(Src) 98 F (36.7 C) (Oral)  Resp 12  Ht 5' 6.5" (1.689 m)  Wt 188 lb 6 oz (85.446 kg)  BMI 29.95 kg/m2  SpO2 100%  LMP 09/29/2013 General: Alert and oriented in no acute distress. HEENT: TMs pearly grey, normal cone of light. Pharynx without erythema or exudates. No cervical or submandibular LAN. Nasal turbinates white and boggy bilaterally. Mild TTP over left frontal sinus.  Heart: RRR. No murmurs, rubs, gallops. Lungs: Scattered rhonchi, inspiratory and  end expiratory wheezes bibasilar and RML lung fields. No rales. Albuterol/ipratroprium neb tx given and wheezes completing resolved with treatment.         Assessment & Plan:   62 yof with 10 day h/o congestion and 2 day h/o nonproductive cough.   1. Cough Day 2 of nonproductive cough.  - benzonatate (TESSALON) 100 MG capsule; Take 1-2 capsules (100-200 mg total) by mouth 3 (three) times daily as needed for cough.  Dispense: 40 capsule; Refill: 0  2. Viral URI Day 10 of congestion. Not likely bacterial and no need for antibiotics at this point as she is afebrile, denies fever or chills, and has no focal features suggestive of bacterial infection.  - ipratropium (ATROVENT) 0.03 % nasal spray; Place 2 sprays into both nostrils 2 (two) times daily.  Dispense: 30 mL; Refill: 0 - Guaifenesin (MUCINEX MAXIMUM STRENGTH) 1200 MG TB12; Take 1 tablet (1,200 mg total) by mouth every 12 (twelve) hours as needed.  Dispense: 14 tablet; Refill: 1  3. Wheezing Albuterol/Ipratroprium nebulizer done today. Post nebulizer PE without any wheezes or rhonchi. Patient left without SOB or chest discomfort. Home albuterol inhaler written for prn use.  - albuterol (PROVENTIL) (2.5 MG/3ML) 0.083% nebulizer solution 2.5 mg; Take 3 mLs (2.5 mg total) by nebulization once. - ipratropium (ATROVENT) nebulizer solution 0.5 mg; Take 2.5 mLs (0.5 mg total) by nebulization once. - albuterol (  PROVENTIL HFA;VENTOLIN HFA) 108 (90 BASE) MCG/ACT inhaler; Inhale 2 puffs into the lungs every 4 (four) hours as needed for wheezing or shortness of breath (cough, shortness of breath or wheezing.).  Dispense: 1 Inhaler; Refill: 1  4. Allergic rhinitis, unspecified allergic rhinitis type Patient given instructions on how to appropriately use nasal spray as she has historically not liked using it.  - fluticasone (FLONASE) 50 MCG/ACT nasal spray; Place 2 sprays into both nostrils daily.  Dispense: 16 g; Refill: 12

## 2013-10-13 NOTE — Progress Notes (Signed)
I have examined this patient along with [name], [place] and agree.

## 2013-10-16 ENCOUNTER — Other Ambulatory Visit (HOSPITAL_COMMUNITY): Payer: Self-pay | Admitting: Psychiatry

## 2013-10-16 ENCOUNTER — Telehealth (HOSPITAL_COMMUNITY): Payer: Self-pay

## 2013-10-16 ENCOUNTER — Ambulatory Visit (HOSPITAL_COMMUNITY): Payer: Self-pay | Admitting: Psychiatry

## 2013-10-24 ENCOUNTER — Ambulatory Visit (INDEPENDENT_AMBULATORY_CARE_PROVIDER_SITE_OTHER): Payer: Managed Care, Other (non HMO) | Admitting: Psychiatry

## 2013-10-24 ENCOUNTER — Encounter (HOSPITAL_COMMUNITY): Payer: Self-pay | Admitting: Psychiatry

## 2013-10-24 VITALS — BP 107/76 | HR 100 | Ht 66.5 in | Wt 191.2 lb

## 2013-10-24 DIAGNOSIS — F331 Major depressive disorder, recurrent, moderate: Secondary | ICD-10-CM

## 2013-10-24 DIAGNOSIS — F329 Major depressive disorder, single episode, unspecified: Secondary | ICD-10-CM

## 2013-10-24 MED ORDER — VENLAFAXINE HCL ER 75 MG PO CP24: 75.0000 mg | ORAL_CAPSULE | Freq: Every day | ORAL | Status: DC

## 2013-10-24 NOTE — Progress Notes (Signed)
Summer Shields Progress Note   Summer Shields [id number] 28 y.o.  10/24/2013 11:11 AM  Chief Complaint:  I'm not getting paid.  My insurance is giving me a hard time.      History of Present Illness:  Summer Shields came for her followup appointment.  She had missed appointments because she has financially strained and she could not walk because of recent foot surgery.  She started to cut down her Effexor because she did not have enough medication.  She's been experiencing increased anxiety depression and crying spells.  She is very upset on her insurance company would not approve her disability.  Patient continues to have foot pain despite her second surgery.  She feels sometimes isolated, withdrawn and very nervous.  She continues to have difficulty going around and doing things.  She was taking Effexor 112.5 mg which was working very well and we were hoping to increase to 150 however due to missed appointment she has to take reduce amount of Effexor I did she make this appointment.  She admitted panic attacks and poor sleep.  She endorsed crying spells.  She feels sometimes hopeless and helpless however she denies any active suicidal thoughts or homicidal thoughts.  She had applied for long-term disability.  She has not back to work since March.  She also want to try a different therapist because she felt that Summer Shields was not very helpful.  She reported her energy level is reduced.  She denies any paranoia, hallucination, aggression or violence.  She denies any side effects of medication.  She has not taken Xanax which is prescribed by her primary care physician because she is unable to see her primary care physician.  Recently she saw care physician for upper respiratory infection and given antibiotic and medication.  Her infection is getting better.  She is not drinking or using any illegal substances.  She lives with her younger brother and she had a good supporting relationship with  her boyfriend.    Suicidal Ideation: No Plan Formed: No Patient has means to carry out plan: No  Homicidal Ideation: No Plan Formed: No Patient has means to carry out plan: No  Past Psychiatric History/Hospitalization(s) Patient denies any history of psychiatric inpatient treatment or any suicidal attempt but endorses history of passive and fleeting suicidal thoughts.  In the past she had tried Prozac and Wellbutrin.  She also remember having depression when she was attending college however she did not saw psychiatrist.  She was seen therapist Summer Shields however she stopped constantly because she does not see any improvement.  She denies any history of mania, psychosis or any hallucination.  Recently she saw once Summer Shields but does not feel that she clicked very well with her. Anxiety: Yes Bipolar Disorder: No Depression: Yes Mania: No Psychosis: No Schizophrenia: No Personality Disorder: No Hospitalization for psychiatric illness: No History of Electroconvulsive Shock Therapy: No Prior Suicide Attempts: No  Medical History; Patient has plantar fasciitis, fibroadenoma and environmental allergies.  She is seeing physician at [facility] practice.  Psychosocial History; Patient born and raised in [place].  Her parents are divorced.  Patient is single and she lives with her younger brother.  She has a steady relationship with a boyfriend.  Patient has no children.  She is very close to her 2 half siblings.    Review of Systems  Constitutional: Positive for malaise/fatigue.  Musculoskeletal: Positive for joint pain.  Skin: Negative.   Neurological: Negative.  Psychiatric/Behavioral: Positive for depression. The patient is nervous/anxious and has insomnia.    Psychiatric: Agitation: No Hallucination: No Depressed Mood: Yes Insomnia: Yes Hypersomnia: No Altered Concentration: No Feels Worthless: Yes Grandiose Ideas: No Belief In Special Powers: No New/Increased  Substance Abuse: No Compulsions: No  Neurologic: Headache: Yes Seizure: No Paresthesias: No    Outpatient Encounter Prescriptions as of 10/24/2013  Medication Sig  . albuterol (PROVENTIL HFA;VENTOLIN HFA) 108 (90 BASE) MCG/ACT inhaler Inhale 2 puffs into the lungs every 4 (four) hours as needed for wheezing or shortness of breath (cough, shortness of breath or wheezing.).  Marland Kitchen ALPRAZolam (XANAX) 0.5 MG tablet Take 1 tablet (0.5 mg total) by mouth 2 (two) times daily as needed for anxiety.  . Guaifenesin (MUCINEX MAXIMUM STRENGTH) 1200 MG TB12 Take 1 tablet (1,200 mg total) by mouth every 12 (twelve) hours as needed.  . norgestimate-ethinyl estradiol (ORTHO-CYCLEN,SPRINTEC,PREVIFEM) 0.25-35 MG-MCG tablet Take 1 tablet by mouth daily.  Marland Kitchen venlafaxine XR (EFFEXOR-XR) 75 MG 24 hr capsule Take 1 capsule (75 mg total) by mouth daily with breakfast.  . [DISCONTINUED] benzonatate (TESSALON) 100 MG capsule Take 1-2 capsules (100-200 mg total) by mouth 3 (three) times daily as needed for cough.  . [DISCONTINUED] fluticasone (FLONASE) 50 MCG/ACT nasal spray Place 2 sprays into the nose daily.  . [DISCONTINUED] fluticasone (FLONASE) 50 MCG/ACT nasal spray Place 2 sprays into both nostrils daily.  . [DISCONTINUED] ipratropium (ATROVENT) 0.03 % nasal spray Place 2 sprays into both nostrils 2 (two) times daily.  . [DISCONTINUED] venlafaxine XR (EFFEXOR-XR) 37.5 MG 24 hr capsule Take 3 capsule daily    No results found for this or any previous visit (from the past 2160 hour(s)).    Physical Exam: Constitutional:  BP 107/76  Pulse 100  Ht 5' 6.5" (1.689 m)  Wt 191 lb 3.2 oz (86.728 kg)  BMI 30.40 kg/m2  LMP 09/29/2013  Musculoskeletal: Strength & Muscle Tone: within normal limits Gait & Station: normal Patient leans: N/A  Mental Status Examination;  Patient is a young female who is casually dressed and well groomed.  She is anxious but cooperative.  She maintained fair eye contact.  She  describes her mood as anxious and her affect is mood appropriate.  She He denies any active or passive suicidal thoughts or homicidal thoughts.  She denies any auditory or visual hallucination.  There were no paranoia, delusions or any obsessive thoughts.  Her attention and concentration is fair.  There were no flight of ideas or any loose association.  Her fund of knowledge is adequate.  Her speech is fluent and coherent.  Her psychomotor activity is slightly decreased.  She is alert and oriented x3.  There were no tremors or shakes.  Her insight judgment and impulse control is okay.   Established Problem, Stable/Improving (1), Review of Psycho-Social Stressors (1), Review and summation of old records (2), Established Problem, Worsening (2), Review of Last Therapy Session (1), Review of Medication Regimen & Side Effects (2) and Review of New Medication or Change in Dosage (2)  Assessment: Axis I: Depressive disorder NOS, rule out major depressive disorder recurrent  Axis II: Deferred  Axis III:  Past Medical History  Diagnosis Date  . Plantar fasciitis   . Fibroadenoma   . Environmental allergies   . Anxiety   . Depression   . Allergy     Axis IV: Moderate   Plan:  Patient is not taking her Effexor for more than 3 weeks.  I will start Effexor  again 75 mg daily .  Patient continues to have leg pain and she is unable to work.  She is applying for long-term disability.  She like to try a different therapist.  I will refer her to [name] or [name] for counseling.  We will consider increasing the dose of Effexor on her next visit.  Patient to contact her primary care physician for her Xanax and other medications.  Encouraged to keep appointment with a primary care physician.  Recommended to call us back if she has any question or any concern.  I will see her again in 3-4 weeks.  Time spent 25 minutes.  More than 50% of the time spent in psychoeducation, counseling and coordination of  care.  Discuss safety plan that anytime having active suicidal thoughts or homicidal thoughts then patient need to call 911 or go to the local emergency room.   Alencia Gordon T., MD 10/24/2013

## 2013-10-25 ENCOUNTER — Telehealth (HOSPITAL_COMMUNITY): Payer: Self-pay

## 2013-10-29 ENCOUNTER — Encounter (HOSPITAL_COMMUNITY): Payer: Self-pay | Admitting: Psychology

## 2013-10-29 DIAGNOSIS — F32A Depression, unspecified: Secondary | ICD-10-CM

## 2013-10-29 DIAGNOSIS — F329 Major depressive disorder, single episode, unspecified: Secondary | ICD-10-CM

## 2013-10-29 NOTE — Progress Notes (Signed)
MONSERATH NEFF is a 28 y.o. female patient discharged from counseling as didn't return for services.  Outpatient Therapist Discharge Summary  KAAREN NASS    [birth year]   Admission Date: 05/07/13   Discharge Date:  10/29/13 Reason for Discharge:  No show and didn't return Diagnosis:    Depressive D/O NOS    Comments:  No show, then on 06/18/13 appointment informed didn't need counseling at this time.   Delle Reining, LPC

## 2013-11-14 ENCOUNTER — Ambulatory Visit (INDEPENDENT_AMBULATORY_CARE_PROVIDER_SITE_OTHER): Payer: Managed Care, Other (non HMO) | Admitting: Psychiatry

## 2013-11-14 ENCOUNTER — Encounter (HOSPITAL_COMMUNITY): Payer: Self-pay | Admitting: Psychiatry

## 2013-11-14 VITALS — BP 118/83 | HR 93 | Ht 66.0 in | Wt 191.4 lb

## 2013-11-14 DIAGNOSIS — F331 Major depressive disorder, recurrent, moderate: Secondary | ICD-10-CM

## 2013-11-14 DIAGNOSIS — F329 Major depressive disorder, single episode, unspecified: Secondary | ICD-10-CM

## 2013-11-14 MED ORDER — HYDROXYZINE PAMOATE 25 MG PO CAPS: 25.0000 mg | ORAL_CAPSULE | ORAL | Status: DC | PRN

## 2013-11-14 MED ORDER — VENLAFAXINE HCL ER 150 MG PO CP24: 150.0000 mg | ORAL_CAPSULE | Freq: Every day | ORAL | Status: DC

## 2013-11-14 NOTE — Progress Notes (Signed)
[name] [phone] Progress Note   Summer Shields [id number] 28 y.o.  11/14/2013 2:37 PM  Chief Complaint:  I still feel anxious and nervousness.  I think I need to go back on higher dose of Effexor.      History of Present Illness:  Summer Shields came for her followup appointment.  She was started on Effexor 75 mg 3 weeks ago and she is feeling some improvement in her anxiety however she does not feel that she is back to her baseline.  She endorsed irritability, boredom, anxious and nervous.  She is able to sleep better but she has difficulty falling asleep.  She was unable to get Xanax from her primary care physician because her primary care physician is no longer in her network.  She is frustrated with the insurance and she had applied for long-term disability.  She has not back to work since March.  She admitted fewer crying spells but she still feel hopeless worthless and have anxiety about the future.  She endorsed lack of energy and lack of motivation.  She was recommended to see a therapist and finally she is scheduled to see Summer Shields for counseling at week.  Patient was not happy with the previous therapist.  She also complaining of boyfriend who some time gets irritable and does not understand  Her depression.  Overall her appetite is okay.  Her vitals are stable.  She is requesting if she can be given Xanax because she is unable to see the primary care physician.  Patient does not drink or use any illegal substances.  Suicidal Ideation: No Plan Formed: No Patient has means to carry out plan: No  Homicidal Ideation: No Plan Formed: No Patient has means to carry out plan: No  Past Psychiatric History/Hospitalization(s) Patient denies any history of psychiatric inpatient treatment or any suicidal attempt but endorses history of passive and fleeting suicidal thoughts.  In the past she had tried Prozac and Wellbutrin.  She also remember having depression when she was attending  college however she did not saw psychiatrist.  She was seen therapist Summer Shields however she stopped constantly because she does not see any improvement.  She denies any history of mania, psychosis or any hallucination.  Recently she saw once Summer Shields but does not feel that she clicked very well with her. Anxiety: Yes Bipolar Disorder: No Depression: Yes Mania: No Psychosis: No Schizophrenia: No Personality Disorder: No Hospitalization for psychiatric illness: No History of Electroconvulsive Shock Therapy: No Prior Suicide Attempts: No  Medical History; Patient has plantar fasciitis, fibroadenoma and environmental allergies.  She is seeing physician at [facility] practice.  Psychosocial History; Patient born and raised in [place].  Her parents are divorced.  Patient is single and she lives with her younger brother.  She has a steady relationship with a boyfriend.  Patient has no children.  She is very close to her 2 half siblings.    Review of Systems  Musculoskeletal: Positive for joint pain.  Skin: Negative.   Neurological: Negative.   Psychiatric/Behavioral: Positive for depression. The patient is nervous/anxious and has insomnia.    Psychiatric: Agitation: No Hallucination: No Depressed Mood: Yes Insomnia: Yes Hypersomnia: No Altered Concentration: No Feels Worthless: No Grandiose Ideas: No Belief In Special Powers: No New/Increased Substance Abuse: No Compulsions: No  Neurologic: Headache: Yes Seizure: No Paresthesias: No    Outpatient Encounter Prescriptions as of 11/14/2013  Medication Sig  . hydrOXYzine (VISTARIL) 25 MG capsule Take 1  capsule (25 mg total) by mouth as needed for anxiety.  . norgestimate-ethinyl estradiol (ORTHO-CYCLEN,SPRINTEC,PREVIFEM) 0.25-35 MG-MCG tablet Take 1 tablet by mouth daily.  Marland Kitchen venlafaxine XR (EFFEXOR-XR) 150 MG 24 hr capsule Take 1 capsule (150 mg total) by mouth daily with breakfast.  . [DISCONTINUED] albuterol  (PROVENTIL HFA;VENTOLIN HFA) 108 (90 BASE) MCG/ACT inhaler Inhale 2 puffs into the lungs every 4 (four) hours as needed for wheezing or shortness of breath (cough, shortness of breath or wheezing.).  . [DISCONTINUED] ALPRAZolam (XANAX) 0.5 MG tablet Take 1 tablet (0.5 mg total) by mouth 2 (two) times daily as needed for anxiety.  . [DISCONTINUED] Guaifenesin (MUCINEX MAXIMUM STRENGTH) 1200 MG TB12 Take 1 tablet (1,200 mg total) by mouth every 12 (twelve) hours as needed.  . [DISCONTINUED] oxyCODONE-acetaminophen (PERCOCET) 7.5-325 MG per tablet   . [DISCONTINUED] venlafaxine XR (EFFEXOR-XR) 75 MG 24 hr capsule Take 1 capsule (75 mg total) by mouth daily with breakfast.    No results found for this or any previous visit (from the past 2160 hour(s)).    Physical Exam: Constitutional:  BP 118/83 mmHg  Pulse 93  Ht 5\' 6"  (1.676 m)  Wt 191 lb 6.4 oz (86.818 kg)  BMI 30.91 kg/m2  Musculoskeletal: Strength & Muscle Tone: within normal limits Gait & Station: normal Patient leans: N/A  Mental Status Examination;  Patient is a young female who is casually dressed and well groomed.  She is anxious but cooperative.  She maintained fair eye contact.  She describes her mood anxious and her affect is mood appropriate.  She He denies any active or passive suicidal thoughts or homicidal thoughts.  She denies any auditory or visual hallucination.  There were no paranoia, delusions or any obsessive thoughts.  Her attention and concentration is fair.  There were no flight of ideas or any loose association.  Her fund of knowledge is adequate.  Her speech is fluent and coherent.  Her psychomotor activity is slightly decreased.  She is alert and oriented x3.  There were no tremors or shakes.  Her insight judgment and impulse control is okay.   Established Problem, Stable/Improving (1), Review of Psycho-Social Stressors (1), Established Problem, Worsening (2), Review of Last Therapy Session (1), Review of  Medication Regimen & Side Effects (2) and Review of New Medication or Change in Dosage (2)  Assessment: Axis I: Depressive disorder NOS, rule out major depressive disorder recurrent  Axis II: Deferred  Axis III:  Past Medical History  Diagnosis Date  . Plantar fasciitis   . Fibroadenoma   . Environmental allergies   . Anxiety   . Depression   . Allergy     Axis IV: Moderate   Plan:  I review her chart.  She used to take higher dose of Effexor.  I would increase Effexor one 50 mg daily.  I recommended to try Vistaril 25 mg for severe anxiety and nervousness.  I strongly encouraged to see a therapist for coping and social skills.  Recommended to call us back if she has any question or any concern.  I will see her again in 4 weeks.  Time spent 25 minutes.  More than 50% of the time spent in psychoeducation, counseling and coordination of care.  Discuss safety plan that anytime having active suicidal thoughts or homicidal thoughts then patient need to call 911 or go to the local emergency room.   Monna Crean T., MD 11/14/2013

## 2013-12-04 ENCOUNTER — Other Ambulatory Visit: Payer: Self-pay

## 2013-12-04 MED ORDER — NORGESTIMATE-ETH ESTRADIOL 0.25-35 MG-MCG PO TABS: 1.0000 | ORAL_TABLET | Freq: Every day | ORAL | Status: DC

## 2013-12-11 ENCOUNTER — Ambulatory Visit (INDEPENDENT_AMBULATORY_CARE_PROVIDER_SITE_OTHER): Payer: Managed Care, Other (non HMO) | Admitting: Psychiatry

## 2013-12-11 ENCOUNTER — Encounter (HOSPITAL_COMMUNITY): Payer: Self-pay | Admitting: Psychiatry

## 2013-12-11 VITALS — BP 125/69 | HR 96 | Ht 66.0 in | Wt 194.2 lb

## 2013-12-11 DIAGNOSIS — F331 Major depressive disorder, recurrent, moderate: Secondary | ICD-10-CM

## 2013-12-11 DIAGNOSIS — F339 Major depressive disorder, recurrent, unspecified: Secondary | ICD-10-CM

## 2013-12-11 MED ORDER — LAMOTRIGINE 25 MG PO TABS: ORAL_TABLET | ORAL | Status: DC

## 2013-12-11 NOTE — Progress Notes (Signed)
[place] [phone] Progress Note   Summer Shields [id number] 28 y.o.  12/11/2013 2:55 PM  Chief Complaint:  I hate my job.  I get stressed out at work easily.      History of Present Illness:  Summer Shields came for her followup appointment.  She started working this Monday and says that she is reporting increased irritability, anger, mood swing.  She mentioned that she does not like her job but she has no other option.  She is working at [employer].  She has no assignment so far because she back to work after 8 months .  She admitted getting difficulty adjusting to work.  She is taking Effexor XR 150 mg daily and denies any side effects.  She was given Vistaril which she took few times to help anxiety and irritability.  She admitted poor sleep, racing thoughts and some time passive and fleeting suicidal thoughts.  She has no plan or intent but she feels sometimes very frustrated and hopeless.  She was not happy with her boyfriend and on Thanksgiving she did not visit her own family.  She admitted very emotional and labile and sometimes easily tearful and crying.  She started seeing Summer Shields and she wants to continue there for counseling.  Patient denies any hallucination or any paranoia.  She has no side effects.  Her appetite is okay.  Her vitals are stable.  Patient denies drinking or using any illegal substances.  Suicidal Ideation: Passive suicidal thoughts but no plan or intent. Plan Formed: No Patient has means to carry out plan: No  Homicidal Ideation: No Plan Formed: No Patient has means to carry out plan: No  Past Psychiatric History/Hospitalization(s) Patient denies any history of psychiatric inpatient treatment or any suicidal attempt but endorses history of passive and fleeting suicidal thoughts.  In the past she had tried Prozac and Wellbutrin.  She also remember having depression when she was attending college however she did not saw psychiatrist.  She was seen  therapist Summer Shields however she stopped constantly because she does not see any improvement.  She denies any history of mania, psychosis or any hallucination.  Recently she saw once Summer Shields but does not feel that she clicked very well with her. Anxiety: Yes Bipolar Disorder: No Depression: Yes Mania: No Psychosis: No Schizophrenia: No Personality Disorder: No Hospitalization for psychiatric illness: No History of Electroconvulsive Shock Therapy: No Prior Suicide Attempts: No  Medical History; Patient has plantar fasciitis, fibroadenoma and environmental allergies.  She is seeing physician at [facility] practice.  Psychosocial History; Patient born and raised in [place].  Her parents are divorced.  Patient is single and she lives with her younger brother.  She has a steady relationship with a boyfriend.  Patient has no children.  She is very close to her 2 half siblings.    Review of Systems  Musculoskeletal: Positive for joint pain.  Skin: Negative.   Neurological: Negative.   Psychiatric/Behavioral: Positive for depression. The patient is nervous/anxious and has insomnia.        Passive suicidal thoughts but no plan.   Psychiatric: Agitation: No Hallucination: No Depressed Mood: Yes Insomnia: Yes Hypersomnia: No Altered Concentration: No Feels Worthless: No Grandiose Ideas: No Belief In Special Powers: No New/Increased Substance Abuse: No Compulsions: No  Neurologic: Headache: Yes Seizure: No Paresthesias: No    Outpatient Encounter Prescriptions as of 12/11/2013  Medication Sig  . fluticasone (FLONASE) 50 MCG/ACT nasal spray   .  hydrOXYzine (VISTARIL) 25 MG capsule Take 1 capsule (25 mg total) by mouth as needed for anxiety.  . lamoTRIgine (LAMICTAL) 25 MG tablet Take 1 tab daily for 1 week and than 2 tab daily  . norgestimate-ethinyl estradiol (ORTHO-CYCLEN,SPRINTEC,PREVIFEM) 0.25-35 MG-MCG tablet Take 1 tablet by mouth daily. PATIENT NEEDS OFFICE  VISIT FOR ADDITIONAL REFILLS  . venlafaxine XR (EFFEXOR-XR) 150 MG 24 hr capsule Take 1 capsule (150 mg total) by mouth daily with breakfast.    No results found for this or any previous visit (from the past 2160 hour(s)).    Physical Exam: Constitutional:  BP 125/69 mmHg  Pulse 96  Ht 5\' 6"  (1.676 m)  Wt 194 lb 3.2 oz (88.089 kg)  BMI 31.36 kg/m2  Musculoskeletal: Strength & Muscle Tone: within normal limits Gait & Station: normal Patient leans: N/A  Mental Status Examination;  Patient is a young female who is casually dressed and well groomed.  She appears frustrated and anxious.  She maintained fair eye contact.  She describes her mood tired and depressed.  Her affect is constricted.  She endorsed passive and fleeting suicidal thoughts but denies any plan or any intent.  She denies any homicidal thoughts.  She denies any auditory or visual hallucination.  There were no paranoia, delusions or any obsessive thoughts.  Her attention and concentration is fair.  There were no flight of ideas or any loose association.  Her fund of knowledge is adequate.  Her speech is fluent and coherent.  Her psychomotor activity is slightly decreased.  She is alert and oriented x3.  There were no tremors or shakes.  Her insight judgment and impulse control is okay.   Established Problem, Stable/Improving (1), Review of Psycho-Social Stressors (1), Established Problem, Worsening (2), Review of Last Therapy Session (1), Review of Medication Regimen & Side Effects (2) and Review of New Medication or Change in Dosage (2)  Assessment: Axis I: Major depressive disorder, recurrent .  Rule out bipolar disorder depressed type.    Axis II: Deferred  Axis III:  Past Medical History  Diagnosis Date  . Plantar fasciitis   . Fibroadenoma   . Environmental allergies   . Anxiety   . Depression   . Allergy    Plan:  Patient continued to have irritability, anger and frustration.  Despite increasing Effexor  she still have a lot of emotions and frustration.  I recommended to add low-dose Lamictal to help her mood swings and anger.  Continue Effexor XR 150 mg daily.  She has no side effects.  Recommended to use Vistaril 25 mg as needed for insomnia and severe anxiety attack.  Encouraged to keep appointment with Summer Shields for counseling .  I will see her again in 4-5 weeks. Time spent 25 minutes.  More than 50% of the time spent in psychoeducation, counseling and coordination of care.  Discuss safety plan that anytime having active suicidal thoughts or homicidal thoughts then patient need to call 911 or go to the local emergency room.   Ryle Buscemi T., MD 12/11/2013

## 2013-12-12 ENCOUNTER — Other Ambulatory Visit (HOSPITAL_COMMUNITY): Payer: Self-pay | Admitting: Psychiatry

## 2013-12-12 DIAGNOSIS — F331 Major depressive disorder, recurrent, moderate: Secondary | ICD-10-CM

## 2013-12-12 MED ORDER — HYDROXYZINE PAMOATE 25 MG PO CAPS: 25.0000 mg | ORAL_CAPSULE | ORAL | Status: DC | PRN

## 2013-12-25 ENCOUNTER — Other Ambulatory Visit: Payer: Self-pay | Admitting: Physician Assistant

## 2013-12-26 NOTE — Telephone Encounter (Signed)
Spoke to pt and advised she is due for f/up for BCP. She agreed to come in within the month and I RFd 1 more month to cover her until then.

## 2014-01-04 ENCOUNTER — Other Ambulatory Visit (HOSPITAL_COMMUNITY): Payer: Self-pay | Admitting: Psychiatry

## 2014-01-11 ENCOUNTER — Telehealth (HOSPITAL_COMMUNITY): Payer: Self-pay | Admitting: Psychiatry

## 2014-01-15 ENCOUNTER — Encounter (HOSPITAL_COMMUNITY): Payer: Self-pay | Admitting: Psychiatry

## 2014-01-15 ENCOUNTER — Ambulatory Visit (INDEPENDENT_AMBULATORY_CARE_PROVIDER_SITE_OTHER): Payer: Managed Care, Other (non HMO) | Admitting: Psychiatry

## 2014-01-15 VITALS — BP 110/78 | HR 92 | Ht 66.0 in | Wt 194.6 lb

## 2014-01-15 DIAGNOSIS — F332 Major depressive disorder, recurrent severe without psychotic features: Secondary | ICD-10-CM

## 2014-01-15 DIAGNOSIS — F331 Major depressive disorder, recurrent, moderate: Secondary | ICD-10-CM

## 2014-01-15 MED ORDER — HYDROXYZINE PAMOATE 25 MG PO CAPS: 25.0000 mg | ORAL_CAPSULE | ORAL | Status: DC | PRN

## 2014-01-15 MED ORDER — LAMOTRIGINE 25 MG PO TABS: 100.0000 mg | ORAL_TABLET | Freq: Every day | ORAL | Status: DC

## 2014-01-15 NOTE — Progress Notes (Signed)
Summer Shields [phone] Progress Note   Summer Shields [id number] 29 y.o.  01/15/2014 12:07 PM  Chief Complaint:  I like new medication.  I am feeling better.      History of Present Illness:  Summer Shields came for her followup appointment.   on her last visit we started her on Lamictal to help irritability, agitation and depression.  She also mentioned that she is laid off from her job on December 7 .  She has severance package and she is hoping to have her foot surgery very soon so she can apply for a new job.  She endorse less irritability and anger with the Lamictal.  She is still taking Vistaril every night which is helping her sleep but she is only getting 3-4 hours of sleep.  She feels most of the time her insomnia is due to foot pain.  She notices less emotional and less irritable.  She denies any active or passive suicidal thoughts and now she realized that she was stupid to think about suicide.  She had a good relationship with the boyfriend live close by.  Patient is started seeing [name] however she is not sure about the future visits because she may not have insurance.  She has no rash or itching.  She wants to continue Effexor XR 150 mg daily, hydroxyzine 25 mg at bedtime.  Patient denies drinking or using any illegal substances.  Her appetite is okay.  Her vitals are stable.  Suicidal Ideation: No Plan Formed: No Patient has means to carry out plan: No  Homicidal Ideation: No Plan Formed: No Patient has means to carry out plan: No  Past Psychiatric History/Hospitalization(s) Patient denies any history of psychiatric inpatient treatment or any suicidal attempt but endorses history of passive and fleeting suicidal thoughts.  In the past she had tried Prozac and Wellbutrin.  She also remember having depression when she was attending college however she did not saw psychiatrist.  She was seen therapist [name] however she stopped because she does not see any  improvement.  She denies any history of mania, psychosis or any hallucination.  Recently she saw once [name] but does not feel that she clicked very well with her. Anxiety: Yes Bipolar Disorder: No Depression: Yes Mania: No Psychosis: No Schizophrenia: No Personality Disorder: No Hospitalization for psychiatric illness: No History of Electroconvulsive Shock Therapy: No Prior Suicide Attempts: No  Medical History; Patient has plantar fasciitis, fibroadenoma and environmental allergies.  She is seeing physician at [facility] practice.  Psychosocial History; Patient born and raised in [place].  Her parents are divorced.  Patient is single and she lives with her younger brother.  She has a steady relationship with a boyfriend.  Patient has no children.  She is very close to her 2 half siblings.    Review of Systems  Musculoskeletal: Positive for joint pain.  Skin: Negative.  Negative for itching and rash.  Neurological: Negative.   Psychiatric/Behavioral: The patient has insomnia.    Psychiatric: Agitation: No Hallucination: No Depressed Mood: No Insomnia: Yes Hypersomnia: No Altered Concentration: No Feels Worthless: No Grandiose Ideas: No Belief In Special Powers: No New/Increased Substance Abuse: No Compulsions: No  Neurologic: Headache: Yes Seizure: No Paresthesias: No    Outpatient Encounter Prescriptions as of 01/15/2014  Medication Sig  . fluticasone (FLONASE) 50 MCG/ACT nasal spray   . hydrOXYzine (VISTARIL) 25 MG capsule Take 1 capsule (25 mg total) by mouth as needed for anxiety.  Marland Kitchen  lamoTRIgine (LAMICTAL) 25 MG tablet Take 4 tablets (100 mg total) by mouth daily.  . norgestimate-ethinyl estradiol (SPRINTEC 28) 0.25-35 MG-MCG tablet Take 1 tablet by mouth daily. NO MORE REFILLS WITHOUT OFFICE VISIT - 2ND NOTICE  . venlafaxine XR (EFFEXOR-XR) 150 MG 24 hr capsule Take 1 capsule (150 mg total) by mouth daily with breakfast.  . [DISCONTINUED]  hydrOXYzine (VISTARIL) 25 MG capsule Take 1 capsule (25 mg total) by mouth as needed for anxiety.  . [DISCONTINUED] lamoTRIgine (LAMICTAL) 25 MG tablet TAKE 1 TAB DAILY FOR 1 WEEK AND THAN 2 TAB DAILY    No results found for this or any previous visit (from the past 2160 hour(s)).    Physical Exam: Constitutional:  BP 110/78 mmHg  Pulse 92  Ht 5\' 6"  (1.676 m)  Wt 194 lb 9.6 oz (88.27 kg)  BMI 31.42 kg/m2  Musculoskeletal: Strength & Muscle Tone: within normal limits Gait & Station: normal Patient leans: N/A  Mental Status Examination;  Patient is a young female who is casually dressed and well groomed. She maintained fair eye contact.  She describes her mood  okay and her affect is appropriate.   she denies any active or passive suicidal thoughts or homicidal thought.  She denies any auditory or visual hallucination.  There were no paranoia, delusions or any obsessive thoughts.  Her attention and concentration is fair.  There were no flight of ideas or any loose association.  Her fund of knowledge is adequate.  Her speech is fluent and coherent.  Her psychomotor activity is slightly decreased.  She is alert and oriented x3.  There were no tremors or shakes.  Her insight judgment and impulse control is okay.   Established Problem, Stable/Improving (1), Review of Psycho-Social Stressors (1), Review and summation of old records (2), Review of Last Therapy Session (1), Review of Medication Regimen & Side Effects (2) and Review of New Medication or Change in Dosage (2)  Assessment: Axis I: Major depressive disorder, recurrent .  Rule out bipolar disorder depressed type.    Axis II: Deferred  Axis III:  Past Medical History  Diagnosis Date  . Plantar fasciitis   . Fibroadenoma   . Environmental allergies   . Anxiety   . Depression   . Allergy    Plan:   I had a long discussion with the patient about her medication and current psychosocial issues.  She is worried about her  upcoming surgery and unemployment.  She's been laid off but she has enough severance package until she find another job.  I recommended to increase Lamictal 100 mg daily since patient does not have any side effects including any rash or itching.  She sees much improvement with the Lamictal.  Encouraged see [name] for counseling.  At this time continue Effexor XR 150 mg daily and Vistaril 25 mg at bedtime.  I will see her again in 4-5 weeks.   Time spent 25 minutes.  More than 50% of the time spent in psychoeducation, counseling and coordination of care.  Discuss safety plan that anytime having active suicidal thoughts or homicidal thoughts then patient need to call 911 or go to the local emergency room.   Roby Spalla T., MD 01/15/2014

## 2014-01-16 ENCOUNTER — Ambulatory Visit (INDEPENDENT_AMBULATORY_CARE_PROVIDER_SITE_OTHER): Payer: Managed Care, Other (non HMO) | Admitting: Family Medicine

## 2014-01-16 VITALS — BP 118/68 | HR 89 | Temp 98.4°F | Resp 17 | Ht 66.5 in | Wt 192.0 lb

## 2014-01-16 DIAGNOSIS — Z3009 Encounter for other general counseling and advice on contraception: Secondary | ICD-10-CM

## 2014-01-16 DIAGNOSIS — N923 Ovulation bleeding: Secondary | ICD-10-CM

## 2014-01-16 DIAGNOSIS — N939 Abnormal uterine and vaginal bleeding, unspecified: Secondary | ICD-10-CM

## 2014-01-16 DIAGNOSIS — Z0000 Encounter for general adult medical examination without abnormal findings: Secondary | ICD-10-CM

## 2014-01-16 MED ORDER — NORGESTIM-ETH ESTRAD TRIPHASIC 0.18/0.215/0.25 MG-25 MCG PO TABS: 1.0000 | ORAL_TABLET | Freq: Every day | ORAL | Status: DC

## 2014-01-16 NOTE — Patient Instructions (Signed)
Continue birth control pills with a triphasic pill. If this is not helping control the breakthrough spotting please contact us.  Use the fluticasone nose spray 2 sprays each nostril twice daily for about 4 days to try to open up the eustachian tubes.  In addition to the nose spray consider taking Claritin-D or Allegra-D one daily  Return in one year or as needed

## 2014-01-16 NOTE — Progress Notes (Signed)
Physical exam:  History: Patient is here for annual physical exam. It is been about 2 years since she has had one. She is on birth control pills has been having spotting in the latter part of her cycle a regular basis for the last year. She also has had some podiatric problems and is scheduled to have surgery on her foot on Monday she has problems with the ears stuffing up. No other major complaints.  Past medical history: Operations: Foot surgeries and tonsillectomy Major illnesses: None except for the depression issues Medications: See list Allergies: See list  Family history: Parents are living and well. Father has some depression problems one sister has a screen. Otherwise mother has Impression. Grandmother had breast cancer. New  Social history: She is single. Currently unemployed while waiting for surgery. She does not get a lot of exercise. She has a boyfriend who she has been with for 5 years. He lives across the hall from her.  Review of systems: Constitutional: Unremarkable HEENT: Ears popping Respiratory: Unremarkable Cardiovascular unremarkable GI: Unremarkable GU: Menstrual problems as noted above Muscular skeletal: Unremarkable except for the feet Dermatologic: Unremarkable Psychiatric: Depression issues Neurologic: Unremarkable  Physical examination: Well-developed well-nourished young lady in no acute distress. TMs normal. Eyes PERRLA. Fundi benign. Throat clear. Neck supple without nodes or thyromegaly. No carotid bruits. Chest clear process. Heart regular without murmurs gallops or arrhythmias. Breasts are symmetrical. No axillary nodes. No masses except for a small fibroadenoma palpated in the lateral aspect of the right breast at about 9:00. She is has been evaluated in the past. Abdomen soft without mass or tenderness. Normal female external genitalia. Vaginal mucosa unremarkable. Cervix benign. Pap Taken. Bimanual exam feels no adnexal or uterine masses.  Extremities unremarkable. Skin unremarkable.  Assessment: Physical exam Progesterone deficiency causing breakthrough bleeding from birth control pills Eustachian tube dysfunction History of depression History of foot problems  Plan: See instructions

## 2014-01-18 LAB — PAP IG, CT-NG, RFX HPV ASCU
CHLAMYDIA PROBE AMP: NEGATIVE
GC Probe Amp: NEGATIVE

## 2014-01-21 NOTE — Telephone Encounter (Signed)
Telephone call to follow up on a message left concerning patient's current itching but no rash as patient concerned may be related to Lamictal.  Patient reported she had surgery on 01/20/14 and was placed on oxycodone.  Reported speaking to the surgical center and they thought itching more related to pain medications and not Lamictal due to no rash and no history.  Patient reported she would call back if any further concerns.  Will pass on information to Dr. Adele Schilder so he is aware.

## 2014-01-23 ENCOUNTER — Other Ambulatory Visit: Payer: Self-pay | Admitting: Physician Assistant

## 2014-01-24 ENCOUNTER — Telehealth: Payer: Self-pay | Admitting: Family Medicine

## 2014-01-24 NOTE — Telephone Encounter (Signed)
Patient emailed office/[facility] to report a complaint regarding recent office visit on 01-16-14.  Pt contacted on 01-24-14 and complaint reviewed in detail with patient.

## 2014-02-10 ENCOUNTER — Other Ambulatory Visit (HOSPITAL_COMMUNITY): Payer: Self-pay | Admitting: Psychiatry

## 2014-02-11 NOTE — Telephone Encounter (Signed)
Telephone call with patient to verify she has enough medication to last until appointment scheduled 02/13/14.

## 2014-02-13 ENCOUNTER — Encounter (HOSPITAL_COMMUNITY): Payer: Self-pay | Admitting: Psychiatry

## 2014-02-13 ENCOUNTER — Other Ambulatory Visit (HOSPITAL_COMMUNITY): Payer: Self-pay | Admitting: Psychiatry

## 2014-02-13 ENCOUNTER — Ambulatory Visit (INDEPENDENT_AMBULATORY_CARE_PROVIDER_SITE_OTHER): Payer: Managed Care, Other (non HMO) | Admitting: Psychiatry

## 2014-02-13 VITALS — BP 131/84 | HR 99 | Wt 194.0 lb

## 2014-02-13 DIAGNOSIS — F331 Major depressive disorder, recurrent, moderate: Secondary | ICD-10-CM

## 2014-02-13 DIAGNOSIS — F339 Major depressive disorder, recurrent, unspecified: Secondary | ICD-10-CM

## 2014-02-13 MED ORDER — VENLAFAXINE HCL ER 150 MG PO CP24: 150.0000 mg | ORAL_CAPSULE | Freq: Every day | ORAL | Status: DC

## 2014-02-13 MED ORDER — HYDROXYZINE PAMOATE 50 MG PO CAPS: 50.0000 mg | ORAL_CAPSULE | ORAL | Status: DC | PRN

## 2014-02-13 MED ORDER — LAMOTRIGINE 100 MG PO TABS: 100.0000 mg | ORAL_TABLET | Freq: Every day | ORAL | Status: DC

## 2014-02-13 NOTE — Progress Notes (Signed)
Summer Shields [phone] Progress Note   Summer Shields [id number] 29 y.o.  02/13/2014 3:46 PM  Chief Complaint:  I am is still taking Lamictal 50 mg.  I forgot to increase the dose.        History of Present Illness:  Summer Shields came for her followup appointment.  She had surgery of her foot 4 weeks ago.  She is recovering from it.  She still have difficulty walking because of pain but she is hoping to get better.  She is wearing cast/boot and she also required crutches to walk.  She is hoping to come off from crutches in 2 weeks.  She is taking Lamictal 50 mg as she forgot to increase the dose.  She still have irritability, anger, mood swing but less intense and less frequent from the past.  She is complaining of insomnia and anxiety and sometime feeling that Vistaril is not as helpful.  She endorsed racing thoughts and night.  Her boyfriend is living with her to help her in some time her brother help her to.  Patient is hoping to come off from cast/boot in 4 weeks so she can apply for a job.  Her severance package is ending in February and she is very anxious about it.  Patient is tolerating Lamictal and denies any side effects.  She had called 2 weeks ago complaining of itching but she was told by her surgeon that it could be due to coming off from anesthesia.  She is no longer complaining off any itching and she denies any rash.  She was given pain medication which she takes on and off.  She does not want to be any narcotic medication for a long time.  Patient has unable to see Summer Shields because of decrease mobilization.  Patient denies any paranoia, hallucination, suicidal parts or homicidal thought but admitted some time crying spells and very emotional.  Her appetite is okay.  Her vitals are stable.  She denies drinking or using any illegal substances.  Suicidal Ideation: No Plan Formed: No Patient has means to carry out plan: No  Homicidal Ideation: No Plan Formed: No Patient has  means to carry out plan: No  Past Psychiatric History/Hospitalization(s) Patient denies any history of psychiatric inpatient treatment or any suicidal attempt but endorses history of passive and fleeting suicidal thoughts.  In the past she had tried Prozac and Wellbutrin.  She also remember having depression when she was attending college however she did not saw psychiatrist.  She was seen therapist Summer Shields however she stopped because she does not see any improvement.  She denies any history of mania, psychosis or any hallucination.  Recently she saw once Summer Shields but does not feel that she clicked very well with her. Anxiety: Yes Bipolar Disorder: No Depression: Yes Mania: No Psychosis: No Schizophrenia: No Personality Disorder: No Hospitalization for psychiatric illness: No History of Electroconvulsive Shock Therapy: No Prior Suicide Attempts: No  Medical History; Patient has plantar fasciitis, fibroadenoma and environmental allergies.  She is seeing physician at [facility] practice.  Psychosocial History; Patient born and raised in [place].  Her parents are divorced.  Patient is single and she lives with her younger brother.  She has a steady relationship with a boyfriend.  Patient has no children.  She is very close to her 2 half siblings.    Review of Systems  Constitutional: Negative for weight loss and malaise/fatigue.  Musculoskeletal:       Cast on  leg, foot pain  Skin: Negative for itching and rash.  Psychiatric/Behavioral: Negative for suicidal ideas and substance abuse. The patient is nervous/anxious and has insomnia.    Psychiatric: Agitation: No Hallucination: No Depressed Mood: No Insomnia: Yes Hypersomnia: No Altered Concentration: No Feels Worthless: No Grandiose Ideas: No Belief In Special Powers: No New/Increased Substance Abuse: No Compulsions: No  Neurologic: Headache: No Seizure: No Paresthesias: No    Outpatient Encounter  Prescriptions as of 02/13/2014  Medication Sig  . fluticasone (FLONASE) 50 MCG/ACT nasal spray   . hydrOXYzine (VISTARIL) 50 MG capsule Take 1 capsule (50 mg total) by mouth as needed for anxiety.  Marland Kitchen ibuprofen (ADVIL,MOTRIN) 600 MG tablet   . lamoTRIgine (LAMICTAL) 100 MG tablet Take 1 tablet (100 mg total) by mouth daily.  . norgestimate-ethinyl estradiol (SPRINTEC 28) 0.25-35 MG-MCG tablet Take 1 tablet by mouth daily. NO MORE REFILLS WITHOUT OFFICE VISIT - 2ND NOTICE  . Norgestimate-Ethinyl Estradiol Triphasic (ORTHO TRI-CYCLEN LO) 0.18/0.215/0.25 MG-25 MCG tab Take 1 tablet by mouth daily.  Marland Kitchen venlafaxine XR (EFFEXOR-XR) 150 MG 24 hr capsule Take 1 capsule (150 mg total) by mouth daily with breakfast.  . [DISCONTINUED] hydrOXYzine (VISTARIL) 25 MG capsule Take 1 capsule (25 mg total) by mouth as needed for anxiety.  . [DISCONTINUED] lamoTRIgine (LAMICTAL) 25 MG tablet Take 4 tablets (100 mg total) by mouth daily.  . [DISCONTINUED] oxyCODONE-acetaminophen (PERCOCET) 7.5-325 MG per tablet   . [DISCONTINUED] venlafaxine XR (EFFEXOR-XR) 150 MG 24 hr capsule Take 1 capsule (150 mg total) by mouth daily with breakfast.    Recent Results (from the past 2160 hour(s))  Pap IG, CT/NG w/ reflex HPV when ASC-U     Status: None   Collection Time: 01/16/14  2:51 PM  Result Value Ref Range   Specimen adequacy: SEE NOTE     Comment: SATISFACTORY.  Endocervical/transformation zone component present.   FINAL DIAGNOSIS: SEE NOTE     Comment: - NEGATIVE FOR INTRAEPITHELIAL LESIONS OR MALIGNANCY.    Cytotechnologist: SEE NOTE     Comment: SES, BS CT(ASCP)   Chlamydia Probe Amp NEGATIVE    GC Probe Amp NEGATIVE     Comment:   Footnotes:  (1)                      **Normal Reference Range: Negative**          Assay performed using the Gen-Probe APTIMA COMBO2 (R) Assay.         Physical Exam: Constitutional:  BP 131/84 mmHg  Pulse 99  Wt 194 lb (87.998 kg)  LMP  01/02/2014  Musculoskeletal: Strength & Muscle Tone: within normal limits Gait & Station: normal Patient leans: N/A  Mental Status Examination;  Patient is a young female who is casually dressed and groomed. She has cast on her foot and she has difficulty walking.  She maintains fair eye contact.  She describes her mood anxious and nervous and her affect is constricted. She denies any active or passive suicidal thoughts or homicidal thought.  She denies any auditory or visual hallucination.  There were no paranoia, delusions or any obsessive thoughts.  Her attention and concentration is fair.  There were no flight of ideas or any loose association.  Her fund of knowledge is adequate.  Her speech is fluent and coherent.  Her psychomotor activity is slightly decreased.  She is alert and oriented x3.  There were no tremors or shakes.  Her insight judgment and impulse control is okay.  Established Problem, Stable/Improving (1), Review of Psycho-Social Stressors (1), Review and summation of old records (2), Review of Last Therapy Session (1), Review of Medication Regimen & Side Effects (2) and Review of New Medication or Change in Dosage (2)  Assessment: Axis I: Major depressive disorder, recurrent .  Rule out bipolar disorder depressed type.    Axis II: Deferred  Axis III:  Past Medical History  Diagnosis Date  . Plantar fasciitis   . Fibroadenoma   . Environmental allergies   . Anxiety   . Depression   . Allergy    Plan:  I review her current medication.  She is given narcotic pain medication which she is taking on and off.  She has not increased her Lamictal but she is willing to try higher dose since she has seen improvement on Lamictal.  I would increase Lamictal 100 mg daily, continue Effexor one 50 mg daily.  I also suggested to try increased dose of Vistaril which she's been taking as needed to help her insomnia and anxiety symptoms.  Patient cannot come earlier than 3 months because  her insurance will expired at the end of this month.  She like to get 90 day supply of the prescription.  She like to come back in 3 months if she is able to keep her insurance otherwise she will call us and she will be requested to refer somewhere else.  At this time he was scheduled appointment for 3 months however I strongly recommended to call us back if she feels worsening of the symptoms or if she has any question, concern or any side effects.  Time spent 25 minutes.  More than 50% of the time spent in psychoeducation, counseling and coordination of care.  Discuss safety plan that anytime having active suicidal thoughts or homicidal thoughts then patient need to call 911 or go to the local emergency room.   ARFEEN,SYED T., MD 02/13/2014

## 2014-03-31 ENCOUNTER — Telehealth: Payer: Self-pay

## 2014-03-31 NOTE — Telephone Encounter (Signed)
Error

## 2014-04-02 ENCOUNTER — Encounter: Payer: Self-pay | Admitting: Family Medicine

## 2014-04-02 ENCOUNTER — Ambulatory Visit (INDEPENDENT_AMBULATORY_CARE_PROVIDER_SITE_OTHER): Payer: Self-pay | Admitting: Family Medicine

## 2014-04-02 VITALS — BP 110/72 | HR 97 | Temp 98.3°F | Resp 16 | Wt 199.0 lb

## 2014-04-02 DIAGNOSIS — J301 Allergic rhinitis due to pollen: Secondary | ICD-10-CM

## 2014-04-02 DIAGNOSIS — S43422A Sprain of left rotator cuff capsule, initial encounter: Secondary | ICD-10-CM

## 2014-04-02 DIAGNOSIS — M25512 Pain in left shoulder: Secondary | ICD-10-CM

## 2014-04-02 DIAGNOSIS — H6121 Impacted cerumen, right ear: Secondary | ICD-10-CM

## 2014-04-02 NOTE — Progress Notes (Signed)
Subjective:    Patient ID: Summer Shields, female    DOB: Jun 23, 1985, 29 y.o.   MRN: [id number]  04/02/2014  Otalgia and Shoulder Pain   HPI This 29 y.o. female presents for evaluation of the following:    1. Ears popping:  Onset two months ago.  Discussed with provider during CPE in 01/2014; physician felt like popping due to eustachian tube dysfunction and recommended Flonase and decongestant.   Taking Flonase daily; also took Allegra D with no improvement; regimen for two months; did decongestant for one month.  B ear pain worse at night; B ear popping occurs throughout the day.  Congestion intermittent; lots of indoor allergies.  S/p allergy testing + allergens dust mites, cats, grass, trees, pollen.  Has cats.  Allergy testing 11 years ago.  No previous allergy shots.  History of recurrent ear infections in elementary school; s/p ear tubes in past as chld.   No history of asthma as a child; have used Albuterol with acute illnesses.  Getting sick usually involves a nebulizer treatment in the past.  2.  L Shoulder pain : previous injury while not insured.  Lifting a 75 gallon tank and pulled L shoulder and hurt for several days; now hurts ;has been on crutches for four months out of 12 months with worsening.  Better since off crutches. Sleeping on it worsens; cracks a lot with turning.  2010. For a long time pain was interimttent; four surgeries in past 12 months with worsening pain.    3.  Anxiety and depression: followed by psychiatry; followed by counselor 2 times per month; psychiatry ever 1-3 months.  Worsening due to 12 months of cabin fever.  Terminated from job.  Good support system.  +SI.    4.  Plantar fasciitis: s/p two surgeries in past year; s/p buionectomy revision.  Four surgeries in past 12 months.  Wearing CAM walker long currently.    Review of Systems  Constitutional: Negative for fever, chills, diaphoresis and fatigue.  HENT: Positive for congestion, ear pain, postnasal  drip, rhinorrhea and sneezing. Negative for ear discharge, hearing loss, sinus pressure, sore throat, trouble swallowing and voice change.   Eyes: Negative for visual disturbance.  Respiratory: Negative for cough and shortness of breath.   Cardiovascular: Negative for chest pain, palpitations and leg swelling.  Gastrointestinal: Negative for nausea, vomiting, abdominal pain, diarrhea and constipation.  Endocrine: Negative for cold intolerance, heat intolerance, polydipsia, polyphagia and polyuria.  Musculoskeletal: Positive for myalgias and arthralgias. Negative for neck pain and neck stiffness.  Neurological: Negative for dizziness, tremors, seizures, syncope, facial asymmetry, speech difficulty, weakness, light-headedness, numbness and headaches.  Psychiatric/Behavioral: Positive for dysphoric mood.    Past Medical History  Diagnosis Date  . Plantar fasciitis   . Fibroadenoma   . Environmental allergies   . Anxiety   . Depression   . Allergy    Past Surgical History  Procedure Laterality Date  . Wisdom tooth extraction    . Foot surgery    . Bunionectomy Bilateral     04/09/2013 left, 06/30/2013 right,   . Plantar topaz Bilateral     04/09/2013, 06/30/2013  . Open plantar fasciotomy Right     01/16/2014  . Bunionectomy revision Right     08/2013   Allergies  Allergen Reactions  . Amoxicillin   . Codeine   . Tramadol Nausea And Vomiting   Current Outpatient Prescriptions  Medication Sig Dispense Refill  . fluticasone (FLONASE) 50 MCG/ACT nasal spray  12  . hydrOXYzine (VISTARIL) 50 MG capsule Take 1 capsule (50 mg total) by mouth as needed for anxiety. 30 capsule 0  . lamoTRIgine (LAMICTAL) 100 MG tablet Take 1 tablet (100 mg total) by mouth daily. 90 tablet 0  . Norgestimate-Ethinyl Estradiol Triphasic (ORTHO TRI-CYCLEN LO) 0.18/0.215/0.25 MG-25 MCG tab Take 1 tablet by mouth daily. 1 Package 11  . venlafaxine XR (EFFEXOR-XR) 150 MG 24 hr capsule Take 1 capsule (150 mg total)  by mouth daily with breakfast. 90 capsule 0   No current facility-administered medications for this visit.   History   Social History  . Marital Status: Single    Spouse Name: N/A  . Number of Children: N/A  . Years of Education: N/A   Occupational History  . Not on file.   Social History Main Topics  . Smoking status: Never Smoker   . Smokeless tobacco: Never Used  . Alcohol Use: No  . Drug Use: No  . Sexual Activity: Yes    Birth Control/ Protection: Pill   Other Topics Concern  . Not on file   Social History Narrative   Marital status: single; dating seriously x 6 years      Children: none      Lives: with brother; several pets (2 rabbits, 2 cats, 2 bearded dragons)      Employment:  Unemployment; looking for work; office work.      Tobacco; none       Alcohol: none      Drugs: none      Exercise: none currently.   Family History  Problem Relation Age of Onset  . Diabetes Maternal Grandfather   . Hypertension Maternal Grandfather   . Stroke Maternal Grandfather   . Hyperlipidemia Maternal Grandfather   . Depression Father   . Mental illness Sister     ADHD  . Mental illness Brother     anxiety  . Cancer Paternal Grandmother     skin        Objective:    BP 110/72 mmHg  Pulse 97  Temp(Src) 98.3 F (36.8 C) (Oral)  Resp 16  Wt 199 lb (90.266 kg)  SpO2 98%  LMP 03/19/2014 Physical Exam  Constitutional: She is oriented to person, place, and time. She appears well-developed and well-nourished. No distress.  HENT:  Head: Normocephalic and atraumatic.  Right Ear: Hearing, external ear and ear canal normal. Tympanic membrane is retracted.  Left Ear: Hearing, external ear and ear canal normal. Tympanic membrane is retracted.  Nose: Mucosal edema and rhinorrhea present. Right sinus exhibits no maxillary sinus tenderness and no frontal sinus tenderness. Left sinus exhibits no maxillary sinus tenderness and no frontal sinus tenderness.  Mouth/Throat:  Oropharynx is clear and moist.  R ear canal with large amount of cerumen; irrigation performed in office.  Eyes: Conjunctivae are normal. Pupils are equal, round, and reactive to light.  Neck: Normal range of motion. Neck supple.  Cardiovascular: Normal rate, regular rhythm and normal heart sounds.  Exam reveals no gallop and no friction rub.   No murmur heard. Pulmonary/Chest: Effort normal and breath sounds normal. She has no wheezes. She has no rales.  Musculoskeletal:       Left shoulder: She exhibits tenderness, bony tenderness and pain. She exhibits normal range of motion, no spasm, normal pulse and normal strength.       Cervical back: Normal. She exhibits normal range of motion, no tenderness, no bony tenderness, no pain and no spasm.  L  shoulder: full range of motion of shoulder; posterior shoulder pain reproduced with cross over, empty can sign; normal internal and external rotation of shoulder; motor 5/5/.  Neurological: She is alert and oriented to person, place, and time.  Skin: She is not diaphoretic.  Psychiatric: She has a normal mood and affect. Her behavior is normal.  Nursing note and vitals reviewed.  R EAR IRRIGATION PERFORMED DURING VISIT.     Assessment & Plan:   1. Left shoulder pain   2. Cerumen impaction, right   3. Allergic rhinitis due to pollen   4. Rotator cuff sprain, left, initial encounter     1. L shoulder pain/rotator cuff pathology:  New.  Injury six years ago with recent worsening pain with crutches use intermittently for past year.  Due to lack of insurance, no imaging obtained; home exercise program provided to perform daily for one month. RTC one month; if no improvement, obtain L shoulder film and refer to PT and/or ortho. 2.  Cerumen impaction R:  New.  S/p ear irrigation in office. 3. Allergic rhinitis: uncontrolled; continue Flonase; add Allegra or antihistamine of choice daily.  If persistent ear popping persists, add Prednisone +/- abx.  May  warrant allergy shots in future if persistent symptoms.  Consider switch to Nasonex and Xyzal once obtains insurance. 4.  Depression: moderate to severe; contracted safety; followed closely by psychiatry and therapist.   No orders of the defined types were placed in this encounter.    No Follow-up on file.     Drucilla Cumber Elayne Guerin, M.D. [facility] [address] [place], [place]  [address] [phone] phone [phone] fax

## 2014-04-02 NOTE — Patient Instructions (Signed)

## 2014-04-03 DIAGNOSIS — J301 Allergic rhinitis due to pollen: Secondary | ICD-10-CM | POA: Insufficient documentation

## 2014-04-10 ENCOUNTER — Telehealth: Payer: Self-pay

## 2014-04-10 MED ORDER — AZITHROMYCIN 250 MG PO TABS: ORAL_TABLET | ORAL | Status: DC

## 2014-04-10 MED ORDER — PREDNISONE 20 MG PO TABS: ORAL_TABLET | ORAL | Status: DC

## 2014-04-10 NOTE — Telephone Encounter (Signed)
2. Cerumen impaction R: New. S/p ear irrigation in office. 3. Allergic rhinitis: uncontrolled; continue Flonase; add Allegra or antihistamine of choice daily. If persistent ear popping persists, add Prednisone +/- abx. May warrant allergy shots in future if persistent symptoms. Consider switch to Nasonex and Xyzal once obtains insurance.   Dr. Tamala Julian recheck or ENT? Please advise.

## 2014-04-10 NOTE — Telephone Encounter (Signed)
Call --- sent in rx for Zpack and Prednisone for patient to take.

## 2014-04-10 NOTE — Telephone Encounter (Signed)
Ears have improved some, still painful and popping.  What is the next step for treatment?   [phone]

## 2014-04-10 NOTE — Telephone Encounter (Signed)
Spoke with pt, advised message from Dr. Tamala Julian. Pt understood.

## 2014-04-30 ENCOUNTER — Encounter: Payer: Self-pay | Admitting: Family Medicine

## 2014-04-30 ENCOUNTER — Ambulatory Visit (INDEPENDENT_AMBULATORY_CARE_PROVIDER_SITE_OTHER): Payer: 59 | Admitting: Family Medicine

## 2014-04-30 VITALS — BP 117/78 | HR 101 | Temp 98.5°F | Resp 16 | Ht 67.0 in | Wt 203.2 lb

## 2014-04-30 DIAGNOSIS — J301 Allergic rhinitis due to pollen: Secondary | ICD-10-CM | POA: Diagnosis not present

## 2014-04-30 DIAGNOSIS — M722 Plantar fascial fibromatosis: Secondary | ICD-10-CM

## 2014-04-30 DIAGNOSIS — H9203 Otalgia, bilateral: Secondary | ICD-10-CM | POA: Diagnosis not present

## 2014-04-30 DIAGNOSIS — H6983 Other specified disorders of Eustachian tube, bilateral: Secondary | ICD-10-CM | POA: Diagnosis not present

## 2014-04-30 DIAGNOSIS — S46912D Strain of unspecified muscle, fascia and tendon at shoulder and upper arm level, left arm, subsequent encounter: Secondary | ICD-10-CM | POA: Diagnosis not present

## 2014-04-30 DIAGNOSIS — H6993 Unspecified Eustachian tube disorder, bilateral: Secondary | ICD-10-CM

## 2014-04-30 MED ORDER — DOXYCYCLINE HYCLATE 100 MG PO CAPS: 100.0000 mg | ORAL_CAPSULE | Freq: Two times a day (BID) | ORAL | Status: DC

## 2014-04-30 NOTE — Progress Notes (Signed)
Subjective:    Patient ID: Summer Shields, female    DOB: 1985-09-24, 29 y.o.   MRN: [id number]  04/30/2014  Follow-up; Shoulder Pain; and Ear Pain   HPI This 29 y.o. female presents for one month follow-up:  B ear congestion and popping:  S/p Zpack and Prednisone without improvement.  Wearing head bands to prevent ear pain; air will cause ear pain.  Taking Flonase and Allegra daily.  No fever/chills/sweats.  No obvious infection.  No headache. + Intermittent ear pain; mildly achy; worse with laying down.  No rhinorrhea; no nasal congestion.  Chronic sneezing.  No cough.  Normal hearing.  No drainage out of ears.  Allergies are actually doing really well since taking Allegra and Flonase daily.    L shoulder pain:  Pt has been non-compliant with performing daily shoulder exercises.  Continues to suffer with shoulder pain.  Many activities trigger pain during the day.  No associated neck pain. No n/t/w in arms.   Plantar fasciitis: insurance just changed; has been followed by podiatry but needs referral renewed.  Review of Systems  Constitutional: Negative for fever, chills, diaphoresis and fatigue.  HENT: Positive for ear pain and sneezing. Negative for congestion, ear discharge, facial swelling, postnasal drip, rhinorrhea, sinus pressure and sore throat.   Respiratory: Negative for cough and shortness of breath.   Musculoskeletal: Positive for arthralgias. Negative for joint swelling, neck pain and neck stiffness.  Neurological: Negative for dizziness, weakness, light-headedness, numbness and headaches.    Past Medical History  Diagnosis Date  . Plantar fasciitis   . Fibroadenoma   . Environmental allergies   . Anxiety   . Depression   . Allergy    Past Surgical History  Procedure Laterality Date  . Wisdom tooth extraction    . Foot surgery    . Bunionectomy Bilateral     04/09/2013 left, 06/30/2013 right,   . Plantar topaz Bilateral     04/09/2013, 06/30/2013  . Open plantar  fasciotomy Right     01/16/2014  . Bunionectomy revision Right     08/2013   Allergies  Allergen Reactions  . Amoxicillin   . Codeine   . Tramadol Nausea And Vomiting   Current Outpatient Prescriptions  Medication Sig Dispense Refill  . fluticasone (FLONASE) 50 MCG/ACT nasal spray   12  . hydrOXYzine (VISTARIL) 50 MG capsule Take 1 capsule (50 mg total) by mouth as needed for anxiety. 30 capsule 0  . lamoTRIgine (LAMICTAL) 100 MG tablet Take 1 tablet (100 mg total) by mouth daily. 90 tablet 0  . Naproxen-Esomeprazole 500-20 MG TBEC Take by mouth. 2 tablets per day    . Norgestimate-Ethinyl Estradiol Triphasic (ORTHO TRI-CYCLEN LO) 0.18/0.215/0.25 MG-25 MCG tab Take 1 tablet by mouth daily. 1 Package 11  . venlafaxine XR (EFFEXOR-XR) 150 MG 24 hr capsule Take 1 capsule (150 mg total) by mouth daily with breakfast. 90 capsule 0  . doxycycline (VIBRAMYCIN) 100 MG capsule Take 1 capsule (100 mg total) by mouth 2 (two) times daily. 20 capsule 0   No current facility-administered medications for this visit.       Objective:    BP 117/78 mmHg  Pulse 101  Temp(Src) 98.5 F (36.9 C) (Oral)  Resp 16  Ht 5\' 7"  (1.702 m)  Wt 203 lb 3.2 oz (92.171 kg)  BMI 31.82 kg/m2  SpO2 97%  LMP 04/21/2014 Physical Exam  Constitutional: She is oriented to person, place, and time. She appears well-developed and well-nourished. No  distress.  HENT:  Head: Normocephalic and atraumatic.  Right Ear: Hearing, tympanic membrane, external ear and ear canal normal.  Left Ear: Hearing, tympanic membrane, external ear and ear canal normal.  Nose: Nose normal.  Mouth/Throat: Uvula is midline, oropharynx is clear and moist and mucous membranes are normal. No oropharyngeal exudate.  +drainage in posterior OP.  Eyes: Conjunctivae are normal. Pupils are equal, round, and reactive to light.  Neck: Normal range of motion. Neck supple.  Cardiovascular: Normal rate, regular rhythm and normal heart sounds.  Exam  reveals no gallop and no friction rub.   No murmur heard. Pulmonary/Chest: Effort normal and breath sounds normal. She has no wheezes. She has no rales.  Musculoskeletal:       Left shoulder: She exhibits pain. She exhibits normal range of motion, no tenderness, no bony tenderness, no spasm, normal pulse and normal strength.       Cervical back: Normal. She exhibits normal range of motion, no tenderness, no bony tenderness, no pain and no spasm.  Cervical spine: non-tender midline; non-tender paraspinal regions B; full ROM cervical spine without limitation.  Motor 5/5 BUE.  Grip 5/5. L SHOULDER: cross over +; empty can +.  Motor 5/5 all directions.  Neurological: She is alert and oriented to person, place, and time.  Skin: She is not diaphoretic.  Psychiatric: She has a normal mood and affect. Her behavior is normal.  Nursing note and vitals reviewed.       Assessment & Plan:   1. Otalgia of both ears   2. Eustachian tube dysfunction, bilateral   3. Allergic rhinitis due to pollen   4. Left shoulder strain, subsequent encounter   5. Plantar fasciitis of right foot     1. Otalgia B ears: persistent; secondary to ETD.  Refer to ENT. 2. Eustachian tube dysfunction: persistent; no improvement with Allegra, Flonase, Zpack, or Prednisone.  Refer to ENT.  Treat with Doxycycline. 3.  Allergic rhinitis: controlled with current regimen. No changes at this time. 4.  L shoulder strain: unchanged; pt desires two month trial of home exercise program. Call in two months if no improvement and will refer to ortho. 5.  R plantar fasciitis: chronic issue; refer to podiatry for ongoing care.    Meds ordered this encounter  Medications  . Naproxen-Esomeprazole 500-20 MG TBEC    Sig: Take by mouth. 2 tablets per day  . doxycycline (VIBRAMYCIN) 100 MG capsule    Sig: Take 1 capsule (100 mg total) by mouth 2 (two) times daily.    Dispense:  20 capsule    Refill:  0    No Follow-up on  file.     Ronny Korff Elayne Guerin, M.D. [facility] [address] [place], [place]  [address] [phone] phone [phone] fax

## 2014-05-14 ENCOUNTER — Ambulatory Visit (INDEPENDENT_AMBULATORY_CARE_PROVIDER_SITE_OTHER): Payer: 59 | Admitting: Psychiatry

## 2014-05-14 ENCOUNTER — Encounter (HOSPITAL_COMMUNITY): Payer: Self-pay | Admitting: Psychiatry

## 2014-05-14 VITALS — BP 108/71 | HR 84 | Ht 66.5 in | Wt 205.0 lb

## 2014-05-14 DIAGNOSIS — F419 Anxiety disorder, unspecified: Secondary | ICD-10-CM | POA: Diagnosis not present

## 2014-05-14 DIAGNOSIS — F331 Major depressive disorder, recurrent, moderate: Secondary | ICD-10-CM

## 2014-05-14 MED ORDER — HYDROXYZINE PAMOATE 50 MG PO CAPS: 50.0000 mg | ORAL_CAPSULE | ORAL | Status: DC | PRN

## 2014-05-14 MED ORDER — LAMOTRIGINE 100 MG PO TABS: 100.0000 mg | ORAL_TABLET | Freq: Every day | ORAL | Status: DC

## 2014-05-14 MED ORDER — VENLAFAXINE HCL ER 150 MG PO CP24: 150.0000 mg | ORAL_CAPSULE | Freq: Every day | ORAL | Status: DC

## 2014-05-14 NOTE — Progress Notes (Signed)
[facility] [phone] Progress Note   Summer Shields [id number] 29 y.o.  05/14/2014 2:40 PM  Chief Complaint:  Medication management and follow-up.          History of Present Illness:  Summer Shields came for her followup appointment.  She is taking Lamictal, Effexor and some time Vistaril to help her anxiety.  Overall she feeling the medicine is working very well but she continues to have chronic pain, psychosocial issues.  She is relieved that she was able to keep her insurance.  She is also looking for a part-time job.  Recently she was seen in the emergency room because of jaw pain, ear infection and given antibiotic.  She is disappointed because her chronic pain is getting worst .  She has difficulty walking.  However she denies any irritability, anger, mood swing or any crying spells.  Her sleep is good.  She denies any feeling of hopelessness or worthlessness.  She has no rash or itching.  She was to continue her current psychotropic medication.  Her appetite is okay.  Her vitals are stable.  Patient denies drinking or using any illegal substances.  She had a good support from her boyfriend.    Suicidal Ideation: No Plan Formed: No Patient has means to carry out plan: No  Homicidal Ideation: No Plan Formed: No Patient has means to carry out plan: No  Past Psychiatric History/Hospitalization(s) Patient denies any history of psychiatric inpatient treatment or any suicidal attempt but endorses history of passive and fleeting suicidal thoughts.  In the past she had tried Prozac and Wellbutrin.  She also remember having depression when she was attending college however she did not saw psychiatrist.  She was seen therapist [name] however she stopped because she does not see any improvement.  She denies any history of mania, psychosis or any hallucination.  Recently she saw once [name] but does not feel that she clicked very well with her. Anxiety: Yes Bipolar Disorder:  No Depression: Yes Mania: No Psychosis: No Schizophrenia: No Personality Disorder: No Hospitalization for psychiatric illness: No History of Electroconvulsive Shock Therapy: No Prior Suicide Attempts: No  Medical History; Patient has plantar fasciitis, fibroadenoma and environmental allergies.  She is seeing physician at [facility] practice.  Psychosocial History; Patient born and raised in [place].  Her parents are divorced.  Patient is single and she lives with her younger brother.  She has a steady relationship with a boyfriend.  Patient has no children.  She is very close to her 2 half siblings.    Review of Systems  Constitutional: Positive for malaise/fatigue.  HENT:       Jaw pain  Musculoskeletal: Positive for joint pain.       Right foot pain, jaw pain  Skin: Negative for itching and rash.  Neurological: Negative for tremors.  Psychiatric/Behavioral: Negative for suicidal ideas and substance abuse. The patient is nervous/anxious and has insomnia.    Psychiatric: Agitation: No Hallucination: No Depressed Mood: No Insomnia: Yes Hypersomnia: No Altered Concentration: No Feels Worthless: No Grandiose Ideas: No Belief In Special Powers: No New/Increased Substance Abuse: No Compulsions: No  Neurologic: Headache: No Seizure: No Paresthesias: No    Outpatient Encounter Prescriptions as of 05/14/2014  Medication Sig  . fluticasone (FLONASE) 50 MCG/ACT nasal spray   . hydrOXYzine (VISTARIL) 50 MG capsule Take 1 capsule (50 mg total) by mouth as needed for anxiety.  . lamoTRIgine (LAMICTAL) 100 MG tablet Take 1 tablet (100 mg total)  by mouth daily.  . Naproxen-Esomeprazole 500-20 MG TBEC Take by mouth. 2 tablets per day  . Norgestimate-Ethinyl Estradiol Triphasic (ORTHO TRI-CYCLEN LO) 0.18/0.215/0.25 MG-25 MCG tab Take 1 tablet by mouth daily.  Marland Kitchen venlafaxine XR (EFFEXOR-XR) 150 MG 24 hr capsule Take 1 capsule (150 mg total) by mouth daily with breakfast.  .  [DISCONTINUED] doxycycline (VIBRAMYCIN) 100 MG capsule Take 1 capsule (100 mg total) by mouth 2 (two) times daily.  . [DISCONTINUED] hydrOXYzine (VISTARIL) 50 MG capsule Take 1 capsule (50 mg total) by mouth as needed for anxiety.  . [DISCONTINUED] lamoTRIgine (LAMICTAL) 100 MG tablet Take 1 tablet (100 mg total) by mouth daily.  . [DISCONTINUED] venlafaxine XR (EFFEXOR-XR) 150 MG 24 hr capsule Take 1 capsule (150 mg total) by mouth daily with breakfast.   No facility-administered encounter medications on file as of 05/14/2014.    No results found for this or any previous visit (from the past 2160 hour(s)).    Physical Exam: Constitutional:  BP 108/71 mmHg  Pulse 84  Ht 5' 6.5" (1.689 m)  Wt 205 lb (92.987 kg)  BMI 32.60 kg/m2  LMP 04/21/2014  Musculoskeletal: Strength & Muscle Tone: within normal limits Gait & Station: Difficulty walking due to pain in her right foot Patient leans: Front  Mental Status Examination;  Patient is a young female who is casually dressed and groomed.  She has difficulty walking due to right foot pain  She maintains fair eye contact.  She noticed some time inappropriate laughing .  She described her mood anxious and her affect is mood appropriate.  She denies any active or passive suicidal thoughts or homicidal thought.  She denies any auditory or visual hallucination.  There were no paranoia, delusions or any obsessive thoughts.  Her attention and concentration is fair.  There were no flight of ideas or any loose association.  Her fund of knowledge is adequate.  Her speech is fluent and coherent.  Her psychomotor activity is slightly decreased.  She is alert and oriented x3.  There were no tremors or shakes.  Her insight judgment and impulse control is okay.   Established Problem, Stable/Improving (1), Review of Last Therapy Session (1) and Review of Medication Regimen & Side Effects (2)  Assessment: Axis I: Major depressive disorder, recurrent .  Bipolar  disorder , depressed type, anxiety disorder NOS   Axis II: Deferred  Axis III:  Past Medical History  Diagnosis Date  . Plantar fasciitis   . Fibroadenoma   . Environmental allergies   . Anxiety   . Depression   . Allergy    Plan:  Patient doing better on her current medication.  She has no side effects.  She has no rash or itching.  I will continue Lamictal 100 mg daily, Effexor 150 mg daily and Vistaril 50 mg only as needed for severe anxiety.  Recommended to call us back if she has any question or any concern.  Follow-up in 3 months.   Louann Hopson T., MD 05/14/2014

## 2014-05-23 ENCOUNTER — Telehealth: Payer: Self-pay

## 2014-05-23 NOTE — Telephone Encounter (Signed)
Pt is needing Dr.smith to switch her BC to orthotrycyline due to insurance

## 2014-05-26 MED ORDER — NORGESTIM-ETH ESTRAD TRIPHASIC 0.18/0.215/0.25 MG-35 MCG PO TABS
1.0000 | ORAL_TABLET | Freq: Every day | ORAL | Status: DC
Start: 2014-05-26 — End: 2014-08-12

## 2014-05-26 NOTE — Telephone Encounter (Signed)
Changed: Meds ordered this encounter  Medications  . Norgestimate-Ethinyl Estradiol Triphasic 0.18/0.215/0.25 MG-35 MCG tablet    Sig: Take 1 tablet by mouth daily.    Dispense:  1 Package    Refill:  6    Order Specific Question:  Supervising Provider    Answer:  DOOLITTLE, ROBERT P [[id number]]

## 2014-05-26 NOTE — Telephone Encounter (Signed)
Left message letting pt know Rx was changed.

## 2014-05-27 ENCOUNTER — Telehealth: Payer: Self-pay

## 2014-05-27 NOTE — Telephone Encounter (Signed)
SMITH - Pt needs different birth control sent to pharmacy.  The orthotricyclin low is not covered by her insurance.  She just needs the regular orthotricyclin.  Call her at [phone]

## 2014-05-28 NOTE — Telephone Encounter (Signed)
Pt.notified

## 2014-05-28 NOTE — Telephone Encounter (Signed)
This was done on 5/13, and there is documentation that we left a message for the patient that it had been done.

## 2014-06-17 ENCOUNTER — Telehealth (HOSPITAL_COMMUNITY): Payer: Self-pay

## 2014-06-17 NOTE — Telephone Encounter (Signed)
Medication management - Patient called and stated she is getting a rash on thighs, behind knees and elbows that itches.  No burning and questions if the Lamictal or possibly Poison ivey or poison oak?  Reports starts like a bug bite and then spreads going on for the past 5 days.  Patient instructed to not take any more until follow up occurs with Dr. Adele Schilder.  Denies any burning or patchy areas but states it itches and is spreading.

## 2014-06-17 NOTE — Telephone Encounter (Signed)
Telephone call with patient after discussing her rash with Dr. Adele Schilder.   Patient instructed to stop Lamictal and to see if rash improves over the next few days but if not then patient will need to follow up with her PCP or a dermatologist for possible needed treatment of poison oak, poison ivy or some other issue.  Patient instructed if this was the case, Dr. Adele Schilder stated he may restart the mediation at a later date.  If the rash begins to go away over the next few days then Lamictal will not be restarted and if patient needs a sooner appointment to help with mood she could call back with request.  Requested patient call us back towards the end of this week to let us know how things are going and if rash is going away.  Patient agreed with plan and will call back if worsens.

## 2014-07-11 ENCOUNTER — Telehealth (HOSPITAL_COMMUNITY): Payer: Self-pay

## 2014-07-11 DIAGNOSIS — F331 Major depressive disorder, recurrent, moderate: Secondary | ICD-10-CM

## 2014-07-11 NOTE — Telephone Encounter (Signed)
Telephone call with patient after she left a message stating she was able to confirm the rash she had recently on 06/17/14 was actually poison oak and would like to restart Lamictal which she was instructed to stop at that time by Dr. Adele Schilder.  Informed patient Dr. Adele Schilder is now on vacation until 07/28/14 so will have to see if one of his covering providers is willing to restart the medication.  Patient agreed to call back on Tuesday 07/15/14 to see if restart may be possible.

## 2014-07-15 NOTE — Telephone Encounter (Signed)
Agree with restarting the Lamictal at 25 mg for 2 weeks and the increase to 50 mg daily.  If she was up to 100 mg of lamotrigine it was unlikely the rash was from that medication unless she was skipping doses.

## 2014-07-16 MED ORDER — LAMOTRIGINE 25 MG PO TABS
ORAL_TABLET | ORAL | Status: DC
Start: 2014-07-16 — End: 2014-07-24

## 2014-07-16 NOTE — Telephone Encounter (Signed)
Met with Dr. Lovena Le to clarify orders for patient to restart Lamictal 44m for 2 weeks and then increase to 2 a day for 52m  Patient to obtain a one month supply and to instruct her to call back within the month to make sure no further problems with the Lamictal at 5064mefore discussing with Dr. ArfAdele Schilder any need to go up at that time and to make sure no form of rash returns.  Telephone call with patient to inform of all Dr. TayTanna Furrystructions and to again discuss need to stop medication and call if a rash returns as patient stated she kept a rash over a week after stopping previously before that was then diagnosed as poison oak and treated.  Patient stated understanding instruction and plan to start back slower at 65m78mr 1 week, then 2 pills of 65mg30mmg 73ml) each day after.  Patient will return to see Dr. ArfeenAdele Schilder4/16 and will call if any concerns prior to then.  New order with instructions sent to patient's [facility] on [address].

## 2014-07-24 ENCOUNTER — Telehealth (HOSPITAL_COMMUNITY): Payer: Self-pay

## 2014-07-24 NOTE — Telephone Encounter (Signed)
Telephone call with patient after she left a message she did redevelop a rash within 24 hours of restarting Lamictal, after only one pill and stopped it immediately.  Informed Dr.Tadepalli wanted he not to take any more and to never take it again.  Informed Dr. Salem Senate recommended she use over-the-counter Benedryl 25mg  prn for any itching or continued side effect of the Lamictal one time dosage.  Patient reported rash almost gone but if began to itch again she would take a Benedryl.  Patient agreed to keep her appointment with Dr. Adele Schilder on 08/14/14 to discuss alternative medication and to call back if needed prior to then or if any more problems.

## 2014-08-11 ENCOUNTER — Telehealth: Payer: Self-pay

## 2014-08-11 NOTE — Telephone Encounter (Signed)
Patient is calling to request a refill for generic flonase and wants to try orthotricyclin  Because it's cheaper. She also wants to change the pharmacy to [facility] on [address].

## 2014-08-11 NOTE — Telephone Encounter (Signed)
Dr. Linna Darner saw pt last.

## 2014-08-12 MED ORDER — FLUTICASONE PROPIONATE 50 MCG/ACT NA SUSP: 2.0000 | Freq: Every day | NASAL | Status: DC

## 2014-08-12 MED ORDER — NORGESTIM-ETH ESTRAD TRIPHASIC 0.18/0.215/0.25 MG-35 MCG PO TABS: 1.0000 | ORAL_TABLET | Freq: Every day | ORAL | Status: DC

## 2014-08-12 NOTE — Telephone Encounter (Signed)
Left message refills were sent.

## 2014-08-12 NOTE — Telephone Encounter (Signed)
Refills provided. Please remind patient that she will be due for her pap smear in 11/2014. Thank you!

## 2014-08-14 ENCOUNTER — Ambulatory Visit (HOSPITAL_COMMUNITY): Payer: Self-pay | Admitting: Psychiatry

## 2014-08-20 ENCOUNTER — Ambulatory Visit (INDEPENDENT_AMBULATORY_CARE_PROVIDER_SITE_OTHER): Payer: 59 | Admitting: Psychiatry

## 2014-08-20 ENCOUNTER — Encounter (HOSPITAL_COMMUNITY): Payer: Self-pay | Admitting: Psychiatry

## 2014-08-20 VITALS — BP 118/82 | HR 92 | Ht 67.0 in | Wt 214.4 lb

## 2014-08-20 DIAGNOSIS — F3130 Bipolar disorder, current episode depressed, mild or moderate severity, unspecified: Secondary | ICD-10-CM | POA: Diagnosis not present

## 2014-08-20 DIAGNOSIS — F331 Major depressive disorder, recurrent, moderate: Secondary | ICD-10-CM

## 2014-08-20 MED ORDER — VENLAFAXINE HCL ER 150 MG PO CP24: 150.0000 mg | ORAL_CAPSULE | Freq: Every day | ORAL | Status: DC

## 2014-08-20 NOTE — Progress Notes (Signed)
Summer Shields Progress Note   Summer Shields [id number] 29 y.o.  08/20/2014 11:31 AM  Chief Complaint:  I stop taking Lamictal because of rash.            History of Present Illness:  Summer Shields came for her followup appointment.  She stopped taking Lamictal because of rash.  Initially she thought she had poison Ivy but her rash did not resolve and she called office and she was recommended to stop the Lamictal.  Since she stopped the Lamictal her rashes resolved.  She has noticed some anxiety and nervousness but overall her mood swings are under control.  She does not want to try a different medication at this time.  She wants to continue Effexor and Vistaril as needed.  She sleeping better but she still have a lot of psychosocial issues.  She continues to go for physical therapy twice a week .  She is going to start school next week at  [employer] online courses.  She still have chronic foot pain which is not getting better.  Sometimes she feels overwhelmed but denies any feeling of hopelessness or worthlessness.  She is seeing Summer Shields for counseling.  She had a good support from her boyfriend.  She denies any paranoia, hallucination or any crying spells.  Her energy level is good.  Her appetite is okay.  Her vitals are stable.  Patient denies drinking or using any illegal substances.  Suicidal Ideation: No Plan Formed: No Patient has means to carry out plan: No  Homicidal Ideation: No Plan Formed: No Patient has means to carry out plan: No  Past Psychiatric History/Hospitalization(s) Patient denies any history of psychiatric inpatient treatment or any suicidal attempt but endorses history of depression with passive and fleeting suicidal thoughts.  In the past she had tried Prozac and Wellbutrin.  She seen therapist Summer Shields however she stopped because she does not see any improvement.  She denies any history of mania, psychosis or any hallucination.  Recently she saw once  Summer Shields but does not feel that she clicked very well with her. Anxiety: Yes Bipolar Disorder: No Depression: Yes Mania: No Psychosis: No Schizophrenia: No Personality Disorder: No Hospitalization for psychiatric illness: No History of Electroconvulsive Shock Therapy: No Prior Suicide Attempts: No  Medical History; Patient has plantar fasciitis, fibroadenoma and environmental allergies.  She is seeing physician at [facility].  Psychosocial History; Patient born and raised in [place].  Her parents are divorced.  Patient is single and she lives with her younger brother.  She has a steady relationship with a boyfriend.  Patient has no children.  She is very close to her 2 half siblings.    Review of Systems  Musculoskeletal: Positive for joint pain.       Right foot pain  Skin: Negative for itching and rash.  Neurological: Negative for tremors.  Psychiatric/Behavioral: Negative for suicidal ideas and substance abuse. The patient is nervous/anxious.    Psychiatric: Agitation: No Hallucination: No Depressed Mood: No Insomnia: No Hypersomnia: No Altered Concentration: No Feels Worthless: No Grandiose Ideas: No Belief In Special Powers: No New/Increased Substance Abuse: No Compulsions: No  Neurologic: Headache: No Seizure: No Paresthesias: No    Outpatient Encounter Prescriptions as of 08/20/2014  Medication Sig  . fluticasone (FLONASE) 50 MCG/ACT nasal spray Place 2 sprays into both nostrils daily.  . hydrOXYzine (VISTARIL) 50 MG capsule Take 1 capsule (50 mg total) by mouth as needed for anxiety.  Marland Kitchen  Naproxen-Esomeprazole 500-20 MG TBEC Take by mouth. 2 tablets per day  . Norgestimate-Ethinyl Estradiol Triphasic (ORTHO TRI-CYCLEN, 28,) 0.18/0.215/0.25 MG-35 MCG tablet Take 1 tablet by mouth daily.  Marland Kitchen venlafaxine XR (EFFEXOR-XR) 150 MG 24 hr capsule Take 1 capsule (150 mg total) by mouth daily with breakfast.  . [DISCONTINUED] venlafaxine XR  (EFFEXOR-XR) 150 MG 24 hr capsule Take 1 capsule (150 mg total) by mouth daily with breakfast.   No facility-administered encounter medications on file as of 08/20/2014.    No results found for this or any previous visit (from the past 2160 hour(s)).    Physical Exam: Constitutional:  BP 118/82 mmHg  Pulse 92  Ht 5\' 7"  (1.702 m)  Wt 214 lb 6.4 oz (97.251 kg)  BMI 33.57 kg/m2  Musculoskeletal: Strength & Muscle Tone: within normal limits Gait & Station: Difficulty walking due to pain in her right foot Patient leans: Front  Mental Status Examination;  Patient is a young female who is casually dressed and groomed.  She has difficulty walking due to right foot pain  She maintains fair eye contact.  She described her mood anxious and her affect is mood appropriate.  She denies any active or passive suicidal thoughts or homicidal thought.  She denies any auditory or visual hallucination.  There were no paranoia, delusions or any obsessive thoughts.  Her attention and concentration is fair.  There were no flight of ideas or any loose association.  Her fund of knowledge is adequate.  Her speech is fluent and coherent.  Her psychomotor activity is slightly decreased.  She is alert and oriented x3.  There were no tremors or shakes.  Her insight judgment and impulse control is okay.   Established Problem, Stable/Improving (1), Review of Last Therapy Session (1) and Review of Medication Regimen & Side Effects (2)  Assessment: Axis I: Major depressive disorder, recurrent .  Bipolar disorder , depressed type, anxiety disorder NOS   Axis II: Deferred  Axis III:  Past Medical History  Diagnosis Date  . Plantar fasciitis   . Fibroadenoma   . Environmental allergies   . Anxiety   . Depression   . Allergy    Plan:  Patient is no longer taking Lamictal.  At this time she is stable on Effexor and taking when necessary Vistaril for severe anxiety.  She does not want to add any medication at this  time.  Recommended to continue counseling with Summer Shields.  Discussed medication side effects and benefits.  I will continue Effexor 150 mg daily and Vistaril 50 mg only as needed for severe anxiety.  Recommended to call us back if she has any question or any concern.  Follow-up in 3 months.   Maralee Higuchi T., MD 08/20/2014

## 2014-09-16 ENCOUNTER — Telehealth: Payer: Self-pay

## 2014-09-16 NOTE — Telephone Encounter (Signed)
Patient would like for Dr. Tamala Julian to write a rx for xanax. She is having panic attacks and her psychiatrist is out of the office for the holiday. She had to stop taking one of her meds due to allergic reaction (didn't specify which med).she takes 1/2 pill of xanax per day. Cb# [phone]

## 2014-09-17 NOTE — Telephone Encounter (Signed)
Spoke with pt, she states her doctor told her that he does not write this medication because it is a controlled mediation. I thought psychiatrist can write medications?

## 2014-09-17 NOTE — Telephone Encounter (Signed)
Can you write Rx?

## 2014-09-18 NOTE — Telephone Encounter (Signed)
Spoke with pt, advised message from Dr. Smith. Pt understood. 

## 2014-09-18 NOTE — Telephone Encounter (Signed)
Call patient --- unfortunately, I am not able to prescribe Xanax for the patient.  I am seeing psychiatrist are using Xanax less and less for treatment of anxiety.

## 2014-10-28 ENCOUNTER — Other Ambulatory Visit: Payer: Self-pay | Admitting: Physician Assistant

## 2014-10-28 ENCOUNTER — Ambulatory Visit (INDEPENDENT_AMBULATORY_CARE_PROVIDER_SITE_OTHER): Payer: 59 | Admitting: Physician Assistant

## 2014-10-28 ENCOUNTER — Encounter: Payer: Self-pay | Admitting: Physician Assistant

## 2014-10-28 VITALS — BP 113/79 | HR 83 | Temp 98.3°F | Resp 16 | Wt 231.0 lb

## 2014-10-28 DIAGNOSIS — R519 Headache, unspecified: Secondary | ICD-10-CM

## 2014-10-28 DIAGNOSIS — R251 Tremor, unspecified: Secondary | ICD-10-CM | POA: Diagnosis not present

## 2014-10-28 DIAGNOSIS — Z23 Encounter for immunization: Secondary | ICD-10-CM | POA: Diagnosis not present

## 2014-10-28 DIAGNOSIS — R51 Headache: Secondary | ICD-10-CM | POA: Diagnosis not present

## 2014-10-28 DIAGNOSIS — R5383 Other fatigue: Secondary | ICD-10-CM | POA: Diagnosis not present

## 2014-10-28 LAB — COMPLETE METABOLIC PANEL WITH GFR
ALBUMIN: 3.7 g/dL (ref 3.6–5.1)
ALK PHOS: 59 U/L (ref 33–115)
ALT: 15 U/L (ref 6–29)
AST: 17 U/L (ref 10–30)
BUN: 14 mg/dL (ref 7–25)
CHLORIDE: 107 mmol/L (ref 98–110)
CO2: 24 mmol/L (ref 20–31)
Calcium: 8.6 mg/dL (ref 8.6–10.2)
Creat: 0.67 mg/dL (ref 0.50–1.10)
GFR, Est African American: >89 mL/min (ref >=60)
GFR, Est Non African American: >89 mL/min (ref >=60)
Glucose, Bld: 83 mg/dL (ref 65–99)
POTASSIUM: 4 mmol/L (ref 3.5–5.3)
SODIUM: 139 mmol/L (ref 135–146)
Total Bilirubin: 0.5 mg/dL (ref 0.2–1.2)
Total Protein: 5.7 g/dL — ABNORMAL LOW (ref 6.1–8.1)

## 2014-10-28 LAB — GLUCOSE, POCT (MANUAL RESULT ENTRY): POC GLUCOSE: 91 mg/dL (ref 70–99)

## 2014-10-28 LAB — TSH: TSH: 4.872 u[IU]/mL — ABNORMAL HIGH (ref 0.350–4.500)

## 2014-10-28 MED ORDER — FREESTYLE SYSTEM KIT: 1.0000 | PACK | Status: DC | PRN

## 2014-10-28 MED ORDER — RIZATRIPTAN BENZOATE 10 MG PO TABS: 10.0000 mg | ORAL_TABLET | ORAL | Status: DC | PRN

## 2014-10-28 NOTE — Progress Notes (Signed)
Summer Shields  MRN: [id number] DOB: March 04, 1985  Subjective:  Pt presents to clinic because she is worried about her sugar.  Family h/o DM and hypoglycemia.  Low energy recently and times in the afternoon where she feels shaky and does not think she is anxious at the same time.  She was having a similar feelings in about 1pm when she woke for the day until she added the 10am snack when she was up.  Grazing for food - she lost 70lb but then gained it back after the surgery on her feet because she has been sitting on the couch for greater than 6 months.  Eats before bed - yogurt snack Goes to bed about 3am Eat 10:30am eat cereal- then back to sleep until 1pm Eat 1:30-2p - meal Most of her symptoms - nausea and shaky and feels like she needs to eat Eat 6pm - meal Eat 10pm - small meal  Disability for foot for the last 1.5y - plantar fascitis and bunions - Jan 2016 surgery on both feet   Headaches - once a week has nausea sometimes light sensitivity cannot concentrate - over the past few months - treats it with a 2 hours nap but wakes with a residual HA which last until she goes to sleep - tylenol takes the edge off - brother has migraines -  HA not associated with anything - about 4 a month seem to be weekly and not associated with her menses.  She does not have neck pain but she does state that she cannot wear her hair up when she has these HA because it increases her pain.  She has neck muscle tension but it does not seem worse when she has these headaches.   Patient Active Problem List   Diagnosis Date Noted  . Allergic rhinitis due to pollen 04/03/2014  . Depressive disorder, not elsewhere classified 05/10/2013  . Sprain, strain, toes 05/25/2012  . Metatarsalgia of both feet 04/11/2012  . Gait abnormality 04/11/2012  . Plantar fasciitis 04/20/2011    Current Outpatient Prescriptions on File Prior to Visit  Medication Sig Dispense Refill  . fluticasone (FLONASE) 50 MCG/ACT nasal  spray Place 2 sprays into both nostrils daily. 16 g 6  . hydrOXYzine (VISTARIL) 50 MG capsule Take 1 capsule (50 mg total) by mouth as needed for anxiety. 30 capsule 0  . venlafaxine XR (EFFEXOR-XR) 150 MG 24 hr capsule Take 1 capsule (150 mg total) by mouth daily with breakfast. 90 capsule 0  . Norgestimate-Ethinyl Estradiol Triphasic (ORTHO TRI-CYCLEN, 28,) 0.18/0.215/0.25 MG-35 MCG tablet Take 1 tablet by mouth daily. (Patient not taking: Reported on 10/28/2014) 3 Package 3   No current facility-administered medications on file prior to visit.    Allergies  Allergen Reactions  . Lamictal [Lamotrigine] Rash  . Amoxicillin   . Codeine   . Tramadol Nausea And Vomiting    Review of Systems  Constitutional: Positive for activity change (2nd to surgery).  Neurological: Positive for weakness, light-headedness and headaches. Negative for numbness.   Objective:  BP 113/79 mmHg  Pulse 83  Temp(Src) 98.3 F (36.8 C) (Oral)  Resp 16  Wt 231 lb (104.781 kg)  LMP 10/22/2014  Physical Exam  Constitutional: She is oriented to person, place, and time and well-developed, well-nourished, and in no distress.  HENT:  Head: Normocephalic and atraumatic.  Right Ear: Hearing, tympanic membrane, external ear and ear canal normal.  Left Ear: Hearing, tympanic membrane, external ear and ear canal normal.  Nose: Nose normal.  Mouth/Throat: Uvula is midline, oropharynx is clear and moist and mucous membranes are normal.  Eyes: Conjunctivae and EOM are normal. Pupils are equal, round, and reactive to light.  Neck: Trachea normal and normal range of motion. Neck supple. No thyroid mass and no thyromegaly present.  Cardiovascular: Normal rate, regular rhythm and normal heart sounds.   No murmur heard. Pulmonary/Chest: Effort normal and breath sounds normal. She has no wheezes.  Musculoskeletal: Normal range of motion.  Lymphadenopathy:    She has no cervical adenopathy.  Neurological: She is alert  and oriented to person, place, and time. She has normal motor skills, normal sensation, normal strength and normal reflexes. Gait normal. Coordination normal.  Skin: Skin is warm and dry.  Psychiatric: Mood, memory, affect and judgment normal.   Results for orders placed or performed in visit on 10/28/14  Hemoglobin A1c  Result Value Ref Range   Hgb A1c MFr Bld 5.1 <5.7 %   Mean Plasma Glucose 100 <117 mg/dL  COMPLETE METABOLIC PANEL WITH GFR  Result Value Ref Range   Sodium 139 135 - 146 mmol/L   Potassium 4.0 3.5 - 5.3 mmol/L   Chloride 107 98 - 110 mmol/L   CO2 24 20 - 31 mmol/L   Glucose, Bld 83 65 - 99 mg/dL   BUN 14 7 - 25 mg/dL   Creat 0.67 0.50 - 1.10 mg/dL   Total Bilirubin 0.5 0.2 - 1.2 mg/dL   Alkaline Phosphatase 59 33 - 115 U/L   AST 17 10 - 30 U/L   ALT 15 6 - 29 U/L   Total Protein 5.7 (L) 6.1 - 8.1 g/dL   Albumin 3.7 3.6 - 5.1 g/dL   Calcium 8.6 8.6 - 10.2 mg/dL   GFR, Est African American >89 >=60 mL/min   GFR, Est Non African American >89 >=60 mL/min  TSH  Result Value Ref Range   TSH 4.872 (H) 0.350 - 4.500 uIU/mL  POCT glucose (manual entry)  Result Value Ref Range   POC Glucose 91 70 - 99 mg/dl    Assessment and Plan :  Shaky - Plan: POCT glucose (manual entry), Hemoglobin A1c, glucose monitoring kit (FREESTYLE) monitoring kit - pts lab are normal today - she will monitor her glucose during the time she feels shaky and makes sure she notes what is going on and how she feels.  She has no had no medication changes and she is eating regularly and good types of food for the most part.  Need for prophylactic vaccination and inoculation against influenza - Plan: Flu Vaccine QUAD 36+ mos IM  Decreased energy - Plan: COMPLETE METABOLIC PANEL WITH GFR, TSH  Nonintractable headache, unspecified chronicity pattern, unspecified headache type - Plan: rizatriptan (MAXALT) 10 MG tablet - suspect for the migraines and she will keep a HA diary and try Maxalt and recheck  in a month.  [name] PA-C  [facility] 10/30/2014 4:41 PM

## 2014-10-28 NOTE — Patient Instructions (Addendum)
Monitor your sugar when you are having your symptoms Recheck in a month or sooner if you need me

## 2014-10-29 LAB — HEMOGLOBIN A1C
Hgb A1c MFr Bld: 5.1 % (ref <5.7)
Mean Plasma Glucose: 100 mg/dL (ref <117)

## 2014-11-01 LAB — T4, FREE: FREE T4: 1.05 ng/dL (ref 0.80–1.80)

## 2014-11-20 ENCOUNTER — Ambulatory Visit (INDEPENDENT_AMBULATORY_CARE_PROVIDER_SITE_OTHER): Payer: 59 | Admitting: Psychiatry

## 2014-11-20 ENCOUNTER — Encounter (HOSPITAL_COMMUNITY): Payer: Self-pay | Admitting: Psychiatry

## 2014-11-20 VITALS — BP 127/85 | HR 95 | Ht 66.0 in | Wt 234.6 lb

## 2014-11-20 DIAGNOSIS — F419 Anxiety disorder, unspecified: Secondary | ICD-10-CM

## 2014-11-20 DIAGNOSIS — F331 Major depressive disorder, recurrent, moderate: Secondary | ICD-10-CM | POA: Diagnosis not present

## 2014-11-20 MED ORDER — ARIPIPRAZOLE 2 MG PO TABS: 2.0000 mg | ORAL_TABLET | Freq: Every day | ORAL | Status: DC

## 2014-11-20 MED ORDER — VENLAFAXINE HCL ER 150 MG PO CP24: 150.0000 mg | ORAL_CAPSULE | Freq: Every day | ORAL | Status: DC

## 2014-11-20 NOTE — Progress Notes (Signed)
[place] Progress Note   Summer Shields [id number] 29 y.o.  11/20/2014 3:33 PM  Chief Complaint:  I am not feeling well.  I'm looking for work but could not find one.  I have a lot of anxiety and depression.              History of Present Illness:  Summer Shields came for her followup appointment.  She endorse increased anxiety and depression in recent months.  She is very concerned about her finances.  She could not find a job and she is still living on her savings and unemployment.  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  She is taking Effexor but sometimes she feels very anxious, lack of energy, no motivation to do things.  She admitted passive and fleeting thoughts of suicide but denies any plan or any intent.  She is seeing [name] for counseling.  Sometimes she gets overwhelmed and gets irritable and agitated but denies any aggressive behavior.  She had a good support from her boyfriend.  She is hoping to see her mother on Thanksgiving.  Patient denies drinking or using any illegal substances.  She is willing to at something on her Effexor.  She has no tremors or shakes.  She denies any anhedonia, paranoia, hallucination or any crying spells.  Her vitals are stable.  Her appetite is okay. Recently she's seen her primary care physician as she felt her blood sugar may have high but her blood tests were normal.  Her hemoglobin A1c is also normal.    Suicidal Ideation: No Plan Formed: No Patient has means to carry out plan: No  Homicidal Ideation: No Plan Formed: No Patient has means to carry out plan: No  Past Psychiatric History/Hospitalization(s) Patient denies any history of psychiatric inpatient treatment or any suicidal attempt but endorses history of depression with passive and fleeting suicidal thoughts.  In the past she had tried Prozac and Wellbutrin.   We tried her on Lamictal but she developed a rash.  She seen therapist [name]  and Legrand Pitts  however she stopped because she does not see any improvement.  She denies any history of mania, psychosis or any hallucination.   Anxiety: Yes Bipolar Disorder: No Depression: Yes Mania: No Psychosis: No Schizophrenia: No Personality Disorder: No Hospitalization for psychiatric illness: No History of Electroconvulsive Shock Therapy: No Prior Suicide Attempts: No  Medical History; Patient has plantar fasciitis, fibroadenoma and environmental allergies.  She is seeing physician at [facility] practice.  Psychosocial History; Patient born and raised in [place].  Her parents are divorced.  Patient is single and she lives with her younger brother.  She has a steady relationship with a boyfriend.  Patient has no children.  She is very close to her 2 half siblings.    Review of Systems  Musculoskeletal: Positive for joint pain.       Right foot pain  Skin: Negative for itching and rash.  Neurological: Negative for tremors.  Psychiatric/Behavioral: Positive for depression. Negative for suicidal ideas and substance abuse. The patient is nervous/anxious and has insomnia.    Psychiatric: Agitation: No Hallucination: No Depressed Mood: Yes Insomnia: Yes Hypersomnia: No Altered Concentration: No Feels Worthless: No Grandiose Ideas: No Belief In Special Powers: No New/Increased Substance Abuse: No Compulsions: No  Neurologic: Headache: No Seizure: No Paresthesias: No    Outpatient Encounter Prescriptions as of 11/20/2014  Medication Sig  . ARIPiprazole (ABILIFY) 2 MG tablet Take 1  tablet (2 mg total) by mouth daily.  . fluticasone (FLONASE) 50 MCG/ACT nasal spray Place 2 sprays into both nostrils daily.  Marland Kitchen glucose monitoring kit (FREESTYLE) monitoring kit 1 each by Does not apply route as needed for other.  . hydrOXYzine (VISTARIL) 50 MG capsule Take 1 capsule (50 mg total) by mouth as needed for anxiety.  . Norgestimate-Ethinyl Estradiol Triphasic (ORTHO TRI-CYCLEN, 28,)  0.18/0.215/0.25 MG-35 MCG tablet Take 1 tablet by mouth daily. (Patient not taking: Reported on 10/28/2014)  . rizatriptan (MAXALT) 10 MG tablet Take 1 tablet (10 mg total) by mouth as needed for migraine. May repeat in 2 hours if needed  . venlafaxine XR (EFFEXOR-XR) 150 MG 24 hr capsule Take 1 capsule (150 mg total) by mouth daily with breakfast.  . [DISCONTINUED] venlafaxine XR (EFFEXOR-XR) 150 MG 24 hr capsule Take 1 capsule (150 mg total) by mouth daily with breakfast.   No facility-administered encounter medications on file as of 11/20/2014.    Recent Results (from the past 2160 hour(s))  Hemoglobin A1c     Status: None   Collection Time: 10/28/14 12:52 PM  Result Value Ref Range   Hgb A1c MFr Bld 5.1 <5.7 %    Comment:                                                                        According to the ADA Clinical Practice Recommendations for 2011, when HbA1c is used as a screening test:     >=6.5%   Diagnostic of Diabetes Mellitus            (if abnormal result is confirmed)   5.7-6.4%   Increased risk of developing Diabetes Mellitus   References:Diagnosis and Classification of Diabetes Mellitus,Diabetes ZSWF,0932,35(TDDUK 1):S62-S69 and Standards of Medical Care in         Diabetes - 2011,Diabetes Care,2011,34 (Suppl 1):S11-S61.      Mean Plasma Glucose 100 <117 mg/dL  COMPLETE METABOLIC PANEL WITH GFR     Status: Abnormal   Collection Time: 10/28/14 12:52 PM  Result Value Ref Range   Sodium 139 135 - 146 mmol/L   Potassium 4.0 3.5 - 5.3 mmol/L   Chloride 107 98 - 110 mmol/L   CO2 24 20 - 31 mmol/L   Glucose, Bld 83 65 - 99 mg/dL   BUN 14 7 - 25 mg/dL   Creat 0.67 0.50 - 1.10 mg/dL   Total Bilirubin 0.5 0.2 - 1.2 mg/dL   Alkaline Phosphatase 59 33 - 115 U/L   AST 17 10 - 30 U/L   ALT 15 6 - 29 U/L   Total Protein 5.7 (L) 6.1 - 8.1 g/dL   Albumin 3.7 3.6 - 5.1 g/dL   Calcium 8.6 8.6 - 10.2 mg/dL   GFR, Est African American >89 >=60 mL/min   GFR, Est Non  African American >89 >=60 mL/min    Comment:   The estimated GFR is a calculation valid for adults (>=65 years old) that uses the CKD-EPI algorithm to adjust for age and sex. It is   not to be used for children, pregnant women, hospitalized patients,    patients on dialysis, or with rapidly changing kidney function. According to the NKDEP, eGFR >89 is normal,  60-89 shows mild impairment, 30-59 shows moderate impairment, 15-29 shows severe impairment and <15 is ESRD.     TSH     Status: Abnormal   Collection Time: 10/28/14 12:52 PM  Result Value Ref Range   TSH 4.872 (H) 0.350 - 4.500 uIU/mL  T4, free     Status: None   Collection Time: 10/28/14 12:52 PM  Result Value Ref Range   Free T4 1.05 0.80 - 1.80 ng/dL  POCT glucose (manual entry)     Status: None   Collection Time: 10/28/14 12:59 PM  Result Value Ref Range   POC Glucose 91 70 - 99 mg/dl      Physical Exam: Constitutional:  BP 127/85 mmHg  Pulse 95  Ht _0  (1.676 m)  Wt 234 lb 9.6 oz (106.414 kg)  BMI 37.88 kg/m2  LMP 10/22/2014  Musculoskeletal: Strength & Muscle Tone: within normal limits Gait & Station: Difficulty walking due to pain in her right foot Patient leans: Front  Mental Status Examination;  Patient is a young female who is casually dressed and groomed.  She has difficulty walking due to right foot pain  She maintains fair eye contact.  She described her mood anxious and depressed.  Her affect is constricted.   she admitted some time fleeting and passive suicidal thoughts but denies any plan, intent .  She wants to get better.  She denies any homicidal thoughts.  She denies any auditory or visual hallucination.  There were no paranoia, delusions or any obsessive thoughts.  Her attention and concentration is fair.  There were no flight of ideas or any loose association.  Her fund of knowledge is adequate.  Her speech is fluent and coherent.  Her psychomotor activity is slightly decreased.  She is alert  and oriented x3.  There were no tremors or shakes.  Her insight judgment and impulse control is okay.   Established Problem, Stable/Improving (1), Review of Psycho-Social Stressors (1), Review or order clinical lab tests (1), Review and summation of old records (2), Established Problem, Worsening (2), Review of Last Therapy Session (1), Review of Medication Regimen & Side Effects (2) and Review of New Medication or Change in Dosage (2)  Assessment: Axis I: Major depressive disorder, recurrent .  Bipolar disorder , depressed type, anxiety disorder NOS   Axis II: Deferred  Axis III:  Past Medical History  Diagnosis Date  . Plantar fasciitis   . Fibroadenoma   . Environmental allergies   . Anxiety   . Depression   . Allergy    Plan:   I review her blood work results, hemoglobin A1c, current medication and psychosocial stressors.  In the past she had tried Lamictal but causes rash. she also tried Prozac and Wellbutrin with limited response.  I recommended to try low-dose Abilify to help her mood and depression.  Another option is Cymbalta but patient like to try Abilify first.  Encouraged to keep appointment with [name] on a regular basis.  Discussed medication side effects including EPS, tremors or shakes.  Discuss safety plan that anytime having active suicidal thoughts or homicidal thoughts and she need to call 911 or go to the local emergency room.  Follow-up in 2 months.  Helen Winterhalter T., MD 11/20/2014

## 2014-11-25 ENCOUNTER — Encounter: Payer: Self-pay | Admitting: Physician Assistant

## 2014-11-25 ENCOUNTER — Ambulatory Visit (INDEPENDENT_AMBULATORY_CARE_PROVIDER_SITE_OTHER): Payer: 59 | Admitting: Physician Assistant

## 2014-11-25 VITALS — BP 113/86 | HR 106 | Temp 98.0°F | Resp 20 | Ht 68.0 in | Wt 234.0 lb

## 2014-11-25 DIAGNOSIS — R51 Headache: Secondary | ICD-10-CM

## 2014-11-25 DIAGNOSIS — R519 Headache, unspecified: Secondary | ICD-10-CM

## 2014-11-25 MED ORDER — RIZATRIPTAN BENZOATE 10 MG PO TABS: 10.0000 mg | ORAL_TABLET | ORAL | Status: DC | PRN

## 2014-11-25 NOTE — Progress Notes (Signed)
   Summer Shields  MRN: [id number] DOB: May 21, 1985  Subjective:  Pt presents to clinic  Patient Active Problem List   Diagnosis Date Noted  . Allergic rhinitis due to pollen 04/03/2014  . Depressive disorder, not elsewhere classified 05/10/2013  . Sprain, strain, toes 05/25/2012  . Metatarsalgia of both feet 04/11/2012  . Gait abnormality 04/11/2012  . Plantar fasciitis 04/20/2011    Current Outpatient Prescriptions on File Prior to Visit  Medication Sig Dispense Refill  . fluticasone (FLONASE) 50 MCG/ACT nasal spray Place 2 sprays into both nostrils daily. 16 g 6  . glucose monitoring kit (FREESTYLE) monitoring kit 1 each by Does not apply route as needed for other. 1 each 0  . hydrOXYzine (VISTARIL) 50 MG capsule Take 1 capsule (50 mg total) by mouth as needed for anxiety. 30 capsule 0  . rizatriptan (MAXALT) 10 MG tablet Take 1 tablet (10 mg total) by mouth as needed for migraine. May repeat in 2 hours if needed 10 tablet 0  . venlafaxine XR (EFFEXOR-XR) 150 MG 24 hr capsule Take 1 capsule (150 mg total) by mouth daily with breakfast. 90 capsule 0  . ARIPiprazole (ABILIFY) 2 MG tablet Take 1 tablet (2 mg total) by mouth daily. (Patient not taking: Reported on 11/25/2014) 30 tablet 2  . Norgestimate-Ethinyl Estradiol Triphasic (ORTHO TRI-CYCLEN, 28,) 0.18/0.215/0.25 MG-35 MCG tablet Take 1 tablet by mouth daily. (Patient not taking: Reported on 10/28/2014) 3 Package 3   No current facility-administered medications on file prior to visit.    Allergies  Allergen Reactions  . Lamictal [Lamotrigine] Rash  . Amoxicillin   . Codeine   . Tramadol Nausea And Vomiting    Review of Systems Objective:  BP 113/86 mmHg  Pulse 106  Temp(Src) 98 F (36.7 C) (Oral)  Resp 20  Ht _0  (1.727 m)  Wt 234 lb (106.142 kg)  BMI 35.59 kg/m2  SpO2 96%  LMP 10/16/2014  Physical Exam  Assessment and Plan :  No diagnosis found.  [name] PA-C  [facility] 11/25/2014 12:20 PM

## 2014-11-25 NOTE — Progress Notes (Signed)
Summer Shields  MRN: [id number] DOB: 05-19-1985  Subjective:  Pt presents to clinic for a recheck.  Shaky - she does not believe it is related to sugar - she has not been able to check her glucose because she never got strips.  She feels like it happens when it is stress but it is better once she eats.  Headaches - once a week - Maxalt seems to help when she takes it - seem to happen at night - she is able to sleep when she takes the pill -   Trying to exercise - trying to go to the pool 2x/week but relies on someone else to go with her and they do not always both want to go.  Just Rx Abilify to help with depression - she has not had it filled yet  Patient Active Problem List   Diagnosis Date Noted  . Allergic rhinitis due to pollen 04/03/2014  . Depressive disorder, not elsewhere classified 05/10/2013  . Sprain, strain, toes 05/25/2012  . Metatarsalgia of both feet 04/11/2012  . Gait abnormality 04/11/2012  . Plantar fasciitis 04/20/2011    Current Outpatient Prescriptions on File Prior to Visit  Medication Sig Dispense Refill  . fluticasone (FLONASE) 50 MCG/ACT nasal spray Place 2 sprays into both nostrils daily. 16 g 6  . glucose monitoring kit (FREESTYLE) monitoring kit 1 each by Does not apply route as needed for other. 1 each 0  . hydrOXYzine (VISTARIL) 50 MG capsule Take 1 capsule (50 mg total) by mouth as needed for anxiety. 30 capsule 0  . venlafaxine XR (EFFEXOR-XR) 150 MG 24 hr capsule Take 1 capsule (150 mg total) by mouth daily with breakfast. 90 capsule 0  . ARIPiprazole (ABILIFY) 2 MG tablet Take 1 tablet (2 mg total) by mouth daily. (Patient not taking: Reported on 11/25/2014) 30 tablet 2  . Norgestimate-Ethinyl Estradiol Triphasic (ORTHO TRI-CYCLEN, 28,) 0.18/0.215/0.25 MG-35 MCG tablet Take 1 tablet by mouth daily. (Patient not taking: Reported on 10/28/2014) 3 Package 3   No current facility-administered medications on file prior to visit.    Allergies    Allergen Reactions  . Lamictal [Lamotrigine] Rash  . Amoxicillin   . Codeine   . Tramadol Nausea And Vomiting    Review of Systems  Neurological: Positive for headaches.  Psychiatric/Behavioral: Positive for dysphoric mood. The patient is nervous/anxious.    Objective:  BP 113/86 mmHg  Pulse 106  Temp(Src) 98 F (36.7 C) (Oral)  Resp 20  Ht _0  (1.727 m)  Wt 234 lb (106.142 kg)  BMI 35.59 kg/m2  SpO2 96%  LMP 10/16/2014  Physical Exam  Constitutional: She is oriented to person, place, and time and well-developed, well-nourished, and in no distress.  HENT:  Head: Normocephalic and atraumatic.  Right Ear: Hearing and external ear normal.  Left Ear: Hearing and external ear normal.  Eyes: Conjunctivae are normal.  Neck: Normal range of motion.  Pulmonary/Chest: Effort normal.  Neurological: She is alert and oriented to person, place, and time. Gait normal.  Skin: Skin is warm and dry.  Psychiatric: Mood, memory, affect and judgment normal.  Vitals reviewed.   Assessment and Plan :  Nonintractable headache, unspecified chronicity pattern, unspecified headache type - Plan: rizatriptan (MAXALT) 10 MG tablet   Headaches are improved with maxalt - she will with her pysch treatment and hopefully the abilify will help her depression and her energy level.  F/u in 6 months  [name] PA-C  [facility] 11/25/2014 12:35 PM

## 2015-01-14 ENCOUNTER — Telehealth (HOSPITAL_COMMUNITY): Payer: Self-pay

## 2015-01-15 ENCOUNTER — Ambulatory Visit (HOSPITAL_COMMUNITY): Payer: Self-pay | Admitting: Psychiatry

## 2015-01-15 NOTE — Telephone Encounter (Signed)
Medication management - Telephone message left for pt. to inform Dr. Adele Schilder would be willing to provide an additional 30 day order for medication but not 90 days and requested pt. call back to reschedule an appoinment and to inform if new 30 day order needed.  Requested pt. Set up a new appointment within the timeframe of an additional 30 days of medication but will await her call back to verify pharmacy and medications needed.

## 2015-01-16 ENCOUNTER — Telehealth (HOSPITAL_COMMUNITY): Payer: Self-pay

## 2015-01-16 DIAGNOSIS — F331 Major depressive disorder, recurrent, moderate: Secondary | ICD-10-CM

## 2015-01-16 MED ORDER — VENLAFAXINE HCL ER 150 MG PO CP24: 150.0000 mg | ORAL_CAPSULE | Freq: Every day | ORAL | Status: DC

## 2015-01-16 MED ORDER — ARIPIPRAZOLE 2 MG PO TABS: 2.0000 mg | ORAL_TABLET | Freq: Every day | ORAL | Status: DC

## 2015-01-16 NOTE — Telephone Encounter (Signed)
Telephone message left for patient after she left another message stating she appreciated Dr.Arfeen agreeing to authorize another 30 day refill of medication but reports she is just starting a new job and cannot come in within the next 90 days as states she is working from 8:15am-6pm each day.  Discussed with Dr.Arfeen and informed patient on message with 90 day orders from 11/20/14 plus another 30 days of patient's Abilify and Effexor XR, patient has enough medication for at least another 6 weeks but would have to come back in to be seen for any further refills after that timeframe.  Informed or our office hours and that patient could not be seen earlier than 8 am so patient would have to decide if can make another appointment time work.  Requested patient call back if any questions and e-scribed in 30 day orders for patient's Abilify 2 mg refill and Effexor XR 150 mg refills to patient's [facility] on [address].

## 2015-02-16 ENCOUNTER — Telehealth (HOSPITAL_COMMUNITY): Payer: Self-pay

## 2015-02-16 DIAGNOSIS — F331 Major depressive disorder, recurrent, moderate: Secondary | ICD-10-CM

## 2015-02-16 NOTE — Telephone Encounter (Signed)
Patient calling, she has a f/u appointment on 2/22, but does not have enough medication to cover her until her appointment. She needs Effexor and Abilify. Please advise, thank you

## 2015-02-17 MED ORDER — VENLAFAXINE HCL ER 150 MG PO CP24: 150.0000 mg | ORAL_CAPSULE | Freq: Every day | ORAL | Status: DC

## 2015-02-17 MED ORDER — ARIPIPRAZOLE 2 MG PO TABS: 2.0000 mg | ORAL_TABLET | Freq: Every day | ORAL | Status: DC

## 2015-02-17 NOTE — Telephone Encounter (Signed)
Met with Dr. Lovena Le, helping to cover for Dr. Adele Schilder out this date as he authorized a one time refill of patient's prescribed Abilify and Effexor XR.  E-scribed these two refill orders in to patient's [facility] [address].  Left patient a message these one time orders were approved and sent to her pharmacy with notation evaluation required for further refills.  Reminded patient on message of her scheduled appointment set for 03/04/15 at 8:30 am and requested patient call back if any problems with obtaining refills and appointment.

## 2015-03-04 ENCOUNTER — Ambulatory Visit (INDEPENDENT_AMBULATORY_CARE_PROVIDER_SITE_OTHER): Payer: BLUE CROSS/BLUE SHIELD | Admitting: Psychiatry

## 2015-03-04 ENCOUNTER — Encounter (HOSPITAL_COMMUNITY): Payer: Self-pay | Admitting: Psychiatry

## 2015-03-04 VITALS — BP 122/68 | HR 98 | Ht 66.0 in | Wt 259.0 lb

## 2015-03-04 DIAGNOSIS — F331 Major depressive disorder, recurrent, moderate: Secondary | ICD-10-CM

## 2015-03-04 MED ORDER — VENLAFAXINE HCL ER 150 MG PO CP24: 150.0000 mg | ORAL_CAPSULE | Freq: Every day | ORAL | Status: DC

## 2015-03-04 MED ORDER — ARIPIPRAZOLE 2 MG PO TABS: 2.0000 mg | ORAL_TABLET | Freq: Every day | ORAL | Status: DC

## 2015-03-04 NOTE — Progress Notes (Signed)
[place] [phone] Progress Note  Summer Shields [id number] 30 y.o.  03/04/2015 9:09 AM  Chief Complaint:  I am in a lot of pain.  I need to go for an MRI today.  History of Present Illness:  Summer Shields came for her follow-up appointment.  She was last seen in November 2016.  She had missed appointment.  She is taking her medication which she believes working very well for her depression and anxiety.  Since low-dose addition of Abilify she has noticed less depressed less anxious and denies any major panic attack.  She sleeping good.  She has not required any Vistaril.  However she has gained more than 30 pounds in past 4 months.  She admitted not doing exercise.  She had planned to do swimming twice a week but has not done in a while.  She is complaining of pain in her right knee and not to do scheduled to have MRI.  She is taking Naprosyn.  She has noticed weight gain since she started her birth control.  Her last hemoglobin A1c was in October which was normal.  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  Though she does not like her job but she is glad that she is working.  Her relationship with the boyfriend is going well.  Patient denies drinking or using any illegal substances.  She denies any paranoia, hallucination, crying spells or any feeling of hopelessness or worthlessness.  She denies any tremors shakes or any EPS.  She do not recall any agitation, anger, aggressive behavior.  She wants to continue Effexor and Abilify.  She sobbed primary care physician for headaches and taking Maxalt as needed.  Suicidal Ideation: No Plan Formed: No Patient has means to carry out plan: No  Homicidal Ideation: No Plan Formed: No Patient has means to carry out plan: No  Medical History; Patient has plantar fasciitis, headaches, chronic pain , fibroadenoma and environmental allergies. She is seeing physician at [facility] practice  Family History; Patient endorse both  parents are alcoholic.  Past Psychiatric History/Hospitalization(s) Patient denies any history of psychiatric inpatient treatment or any suicidal attempt but endorses history of depression with passive and fleeting suicidal thoughts. In the past she had tried Prozac and Wellbutrin. We tried her on Lamictal but she developed a rash. She seen therapist Summer Shields and Summer Shields however she stopped because she does not see any improvement. She denies any history of mania, psychosis or any hallucination.  Anxiety: Yes Bipolar Disorder: No Depression: Yes Mania: No Psychosis: No Schizophrenia: No Personality Disorder: No Hospitalization for psychiatric illness: No History of Electroconvulsive Shock Therapy: No Prior Suicide Attempts: No  Review of Systems  Constitutional: Positive for malaise/fatigue.       Weight gain  Musculoskeletal: Positive for joint pain.  Skin: Negative for itching and rash.  Neurological: Positive for headaches. Negative for dizziness and tremors.    Psychiatric: Agitation: No Hallucination: No Depressed Mood: No Insomnia: No Hypersomnia: No Altered Concentration: No Feels Worthless: No Grandiose Ideas: No Belief In Special Powers: No New/Increased Substance Abuse: No Compulsions: No  Neurologic: Headache: Yes Seizure: No Paresthesias: No  Outpatient Encounter Prescriptions as of 03/04/2015  Medication Sig  . ARIPiprazole (ABILIFY) 2 MG tablet Take 1 tablet (2 mg total) by mouth daily.  . fluticasone (FLONASE) 50 MCG/ACT nasal spray Place 2 sprays into both nostrils daily.  Marland Kitchen glucose monitoring kit (FREESTYLE) monitoring kit 1 each by Does not apply route as  needed for other.  . hydrOXYzine (VISTARIL) 50 MG capsule Take 1 capsule (50 mg total) by mouth as needed for anxiety.  . naproxen (NAPROSYN) 500 MG tablet   . Norgestimate-Ethinyl Estradiol Triphasic (ORTHO TRI-CYCLEN, 28,) 0.18/0.215/0.25 MG-35 MCG tablet Take 1 tablet by mouth  daily. (Patient not taking: Reported on 10/28/2014)  . rizatriptan (MAXALT) 10 MG tablet Take 1 tablet (10 mg total) by mouth as needed for migraine. May repeat in 2 hours if needed  . venlafaxine XR (EFFEXOR-XR) 150 MG 24 hr capsule Take 1 capsule (150 mg total) by mouth daily with breakfast.   No facility-administered encounter medications on file as of 03/04/2015.    No results found for this or any previous visit (from the past 2160 hour(s)).  Physical Exam: Consitutional ;  BP 122/68 mmHg  Pulse 98  Ht '5\' 6"'  (1.676 m)  Wt 259 lb (117.482 kg)  BMI 41.82 kg/m2  LMP 02/11/2015 (Approximate)  Musculoskeletal: Strength & Muscle Tone: within normal limits Gait & Station: Difficulty walking due to pain in her right foot and knee joint Patient leans: N/A   Psychiatric Specialty Exam: General Appearance: Casual and Obese  Eye Contact::  Fair  Speech:  Slow  Volume:  Normal  Mood:  Anxious  Affect:  Congruent  Thought Process:  Coherent  Orientation:  Full (Time, Place, and Person)  Thought Content:  WDL  Suicidal Thoughts:  No  Homicidal Thoughts:  No  Memory:  Immediate;   Fair Recent;   Fair Remote;   Fair  Judgement:  Good  Insight:  Good  Psychomotor Activity:  Normal  Concentration:  Good  Recall:  Good  Fund of Knowledge:  Good  Language:  Good  Akathisia:  No  Handed:  Right  AIMS (if indicated):     Assets:  Communication Skills Desire for Improvement Financial Resources/Insurance Housing Social Support  ADL's:  Intact  Cognition:  WNL  Sleep:        Established Problem, Stable/Improving (1), Review of Psycho-Social Stressors (1), Review or order clinical lab tests (1), Decision to obtain old records (1), Review and summation of old records (2), New Problem, with no additional work-up planned (3) and Review of Medication Regimen & Side Effects (2)  Assessment: Axis I; major depressive disorder, recurrent.  Anxiety disorder NOS  Axis III:  Past  Medical History  Diagnosis Date  . Plantar fasciitis   . Fibroadenoma   . Environmental allergies   . Anxiety   . Depression   . Allergy     Plan:  I review her symptoms, psychosocial stressors, current medication and collateral information from her primary care physician.  Patient is doing much better with low-dose Abilify 2 mg daily and Effexor 20 mg daily.  She has no major panic attack in recent months.  However she has gained excessive weight.  Patient believe started contraceptive causing weight gain.  I encourage her to see her physician to discuss other options for contraceptive treatment.  Encouraged to do regular exercise including swimming .  I also discussed consider taking Topamax for headaches that might be helpful for weight loss.  Continue Abilify 2 mg daily and Effexor XR 150 mg daily.  Discussed medication side effects and benefits.  Recommended counseling but patient is not interested at this time.  Recommended to call us back if she has any question or any concern.  Her last hemoglobin A1c was done in October 2016 which was normal.  Discuss safety plan that anytime  having active suicidal thoughts or homicidal thoughts and she need to call 911 or go to the local emergency room.  I will see her again in 3 months.   Sonora Catlin T., MD 03/04/2015

## 2015-04-08 ENCOUNTER — Ambulatory Visit (INDEPENDENT_AMBULATORY_CARE_PROVIDER_SITE_OTHER): Payer: BLUE CROSS/BLUE SHIELD | Admitting: Physician Assistant

## 2015-04-08 VITALS — BP 120/80 | HR 101 | Temp 98.2°F | Resp 20 | Ht 67.32 in | Wt 259.4 lb

## 2015-04-08 DIAGNOSIS — F411 Generalized anxiety disorder: Secondary | ICD-10-CM | POA: Diagnosis not present

## 2015-04-08 DIAGNOSIS — Z3009 Encounter for other general counseling and advice on contraception: Secondary | ICD-10-CM

## 2015-04-08 DIAGNOSIS — F329 Major depressive disorder, single episode, unspecified: Secondary | ICD-10-CM

## 2015-04-08 DIAGNOSIS — Z309 Encounter for contraceptive management, unspecified: Secondary | ICD-10-CM

## 2015-04-08 MED ORDER — NORGESTIM-ETH ESTRAD TRIPHASIC 0.18/0.215/0.25 MG-25 MCG PO TABS: 1.0000 | ORAL_TABLET | Freq: Every day | ORAL | Status: DC

## 2015-04-08 NOTE — Patient Instructions (Signed)
     IF you received an x-ray today, you will receive an invoice from [facility]. Please contact [facility] at [phone] with questions or concerns regarding your invoice.   IF you received labwork today, you will receive an invoice from [facility]. Please contact [facility] at [phone] with questions or concerns regarding your invoice.   Our billing staff will not be able to assist you with questions regarding bills from these companies.  You will be contacted with the lab results as soon as they are available. The fastest way to get your results is to activate your My Chart account. Instructions are located on the last page of this paperwork. If you have not heard from us regarding the results in 2 weeks, please contact this office.      

## 2015-04-08 NOTE — Progress Notes (Signed)
Summer Shields  MRN: [id number] DOB: 07-19-1985  Subjective:  Pt presents to clinic for referral to a different psychiatrist.  She has been seeing Dr Adele Schilder and is just not happy with him.  He does not seem to remember and know her even though she has been seeing him for a while.  He did start Abilify which has helped her depression about 2 months ago and feels like it helped a little.  She was really upset that at her last visit he was more interested in talking about her weight gain and blamed it on her OCP but did not remember about her 2 years of bed rest and completely major depression without any motivation or energy to do anything physical.  Current counselor - [name] has recommended the [facility] and that is where she would like to go.  Patient Active Problem List   Diagnosis Date Noted  . Anxiety state 04/08/2015  . Allergic rhinitis due to pollen 04/03/2014  . Major depression, chronic ([place]) 05/10/2013  . Sprain, strain, toes 05/25/2012  . Metatarsalgia of both feet 04/11/2012  . Gait abnormality 04/11/2012  . Plantar fasciitis 04/20/2011    Current Outpatient Prescriptions on File Prior to Visit  Medication Sig Dispense Refill  . ARIPiprazole (ABILIFY) 2 MG tablet Take 1 tablet (2 mg total) by mouth daily. 30 tablet 2  . fluticasone (FLONASE) 50 MCG/ACT nasal spray Place 2 sprays into both nostrils daily. 16 g 6  . hydrOXYzine (VISTARIL) 50 MG capsule Take 1 capsule (50 mg total) by mouth as needed for anxiety. 30 capsule 0  . naproxen (NAPROSYN) 500 MG tablet   0  . Norgestimate-Ethinyl Estradiol Triphasic (ORTHO TRI-CYCLEN, 28,) 0.18/0.215/0.25 MG-35 MCG tablet Take 1 tablet by mouth daily. 3 Package 3  . rizatriptan (MAXALT) 10 MG tablet Take 1 tablet (10 mg total) by mouth as needed for migraine. May repeat in 2 hours if needed 10 tablet 2  . venlafaxine XR (EFFEXOR-XR) 150 MG 24 hr capsule Take 1 capsule (150 mg total) by mouth daily with breakfast.  30 capsule 2  . glucose monitoring kit (FREESTYLE) monitoring kit 1 each by Does not apply route as needed for other. (Patient not taking: Reported on 04/08/2015) 1 each 0   No current facility-administered medications on file prior to visit.    Allergies  Allergen Reactions  . Lamictal [Lamotrigine] Rash  . Amoxicillin   . Codeine   . Tramadol Nausea And Vomiting    Review of Systems  Psychiatric/Behavioral: Positive for dysphoric mood. The patient is nervous/anxious.    Objective:  BP 120/80 mmHg  Pulse 101  Temp(Src) 98.2 F (36.8 C) (Oral)  Resp 20  Ht 5' 7.32" (1.71 m)  Wt 259 lb 6.4 oz (117.663 kg)  BMI 40.24 kg/m2  SpO2 98%  LMP 03/25/2015  Physical Exam  Constitutional: She is oriented to person, place, and time and well-developed, well-nourished, and in no distress.  HENT:  Head: Normocephalic and atraumatic.  Right Ear: Hearing and external ear normal.  Left Ear: Hearing and external ear normal.  Eyes: Conjunctivae are normal.  Neck: Normal range of motion.  Pulmonary/Chest: Effort normal.  Neurological: She is alert and oriented to person, place, and time. Gait normal.  Skin: Skin is warm and dry.  Psychiatric: Mood, memory, affect and judgment normal.  Vitals reviewed.   Assessment and Plan :  Major depression, chronic ([place]) - Plan: Ambulatory referral to Psychiatry  Anxiety state - Plan: Ambulatory  referral to Psychiatry  Birth control counseling - Plan: Norgestimate-Ethinyl Estradiol Triphasic (ORTHO TRI-CYCLEN LO) 0.18/0.215/0.25 MG-25 MCG tab - we can switch to lower estrogen but I doubt that will help her weight loss but it will decrease her estrogen.  Sarah Weber PA-C  [facility] 04/08/2015 12:47 PM  

## 2015-06-26 ENCOUNTER — Other Ambulatory Visit (HOSPITAL_COMMUNITY): Payer: Self-pay | Admitting: Psychiatry

## 2015-07-16 ENCOUNTER — Ambulatory Visit (INDEPENDENT_AMBULATORY_CARE_PROVIDER_SITE_OTHER): Payer: 59 | Admitting: Physician Assistant

## 2015-07-16 VITALS — BP 112/80 | HR 84 | Temp 97.5°F | Resp 18 | Ht 67.5 in | Wt 274.0 lb

## 2015-07-16 DIAGNOSIS — Z1329 Encounter for screening for other suspected endocrine disorder: Secondary | ICD-10-CM | POA: Diagnosis not present

## 2015-07-16 DIAGNOSIS — Z130 Encounter for screening for diseases of the blood and blood-forming organs and certain disorders involving the immune mechanism: Secondary | ICD-10-CM | POA: Diagnosis not present

## 2015-07-16 DIAGNOSIS — Z0000 Encounter for general adult medical examination without abnormal findings: Secondary | ICD-10-CM | POA: Diagnosis not present

## 2015-07-16 DIAGNOSIS — Z136 Encounter for screening for cardiovascular disorders: Secondary | ICD-10-CM | POA: Diagnosis not present

## 2015-07-16 DIAGNOSIS — Z13220 Encounter for screening for lipoid disorders: Secondary | ICD-10-CM

## 2015-07-16 DIAGNOSIS — Z113 Encounter for screening for infections with a predominantly sexual mode of transmission: Secondary | ICD-10-CM | POA: Diagnosis not present

## 2015-07-16 DIAGNOSIS — Z13228 Encounter for screening for other metabolic disorders: Secondary | ICD-10-CM

## 2015-07-16 LAB — CBC
HEMATOCRIT: 40.8 % (ref 35.0–45.0)
Hemoglobin: 13.4 g/dL (ref 11.7–15.5)
MCH: 28.5 pg (ref 27.0–33.0)
MCHC: 32.8 g/dL (ref 32.0–36.0)
MCV: 86.6 fL (ref 80.0–100.0)
MPV: 10.1 fL (ref 7.5–12.5)
Platelets: 209 10*3/uL (ref 140–400)
RBC: 4.71 MIL/uL (ref 3.80–5.10)
RDW: 13.2 % (ref 11.0–15.0)
WBC: 9.7 10*3/uL (ref 3.8–10.8)

## 2015-07-16 LAB — COMPLETE METABOLIC PANEL WITH GFR
ALT: 32 U/L — AB (ref 6–29)
AST: 34 U/L — ABNORMAL HIGH (ref 10–30)
Albumin: 3.8 g/dL (ref 3.6–5.1)
Alkaline Phosphatase: 69 U/L (ref 33–115)
BILIRUBIN TOTAL: 0.4 mg/dL (ref 0.2–1.2)
BUN: 11 mg/dL (ref 7–25)
CHLORIDE: 106 mmol/L (ref 98–110)
CO2: 24 mmol/L (ref 20–31)
Calcium: 8.4 mg/dL — ABNORMAL LOW (ref 8.6–10.2)
Creat: 0.79 mg/dL (ref 0.50–1.10)
GLUCOSE: 89 mg/dL (ref 65–99)
Potassium: 3.8 mmol/L (ref 3.5–5.3)
Sodium: 136 mmol/L (ref 135–146)
TOTAL PROTEIN: 5.8 g/dL — AB (ref 6.1–8.1)

## 2015-07-16 LAB — LIPID PANEL
Cholesterol: 157 mg/dL (ref 125–200)
HDL: 64 mg/dL (ref >=46)
LDL CALC: 68 mg/dL (ref <130)
TRIGLYCERIDES: 123 mg/dL (ref <150)
Total CHOL/HDL Ratio: 2.5 Ratio (ref <=5.0)
VLDL: 25 mg/dL (ref <30)

## 2015-07-16 LAB — TSH: TSH: 3.31 m[IU]/L

## 2015-07-16 LAB — HIV ANTIBODY (ROUTINE TESTING W REFLEX): HIV 1&2 Ab, 4th Generation: NONREACTIVE

## 2015-07-16 NOTE — Progress Notes (Signed)
[facility] [address], [place] [address] [phone]  Date:  07/16/2015   Name:  Summer Shields   DOB:  Feb 04, 1985   MRN:  [id number]  PCP:  Pcp Not In System    History of Present Illness:  Summer Shields is a 30 y.o. female patient who presents to [facility] for annual physical exam.  Diet: packed lunches which is chicken vegetables, and rice.  She eats a lot of salads, and cucumbers.  Water intake--32 oz water.  One diet soda per day.  no sweet teas.   BM: irregular.  Constipation, diarrhea--no blood or black stool.    Urine: normal.  No dysuria, hematuria, or frequency.  Sleep: too much, nodding off.  8-9 hours.  Stay asleep.  Not waking up refreshed.  Followed by a counselor with an evaluation of chronic fatigue syndrome.   Social activity: play video games, crochet. Exercising: go to the pool twice per week.   EtoH: none Tobacco, vaping: never Illicit drug use: none  Sexually active: no dyspareunia. Menses, light: regular     Patient Active Problem List   Diagnosis Date Noted  . Anxiety state 04/08/2015  . Allergic rhinitis due to pollen 04/03/2014  . Major depression, chronic ([place]) 05/10/2013  . Sprain, strain, toes 05/25/2012  . Metatarsalgia of both feet 04/11/2012  . Gait abnormality 04/11/2012  . Plantar fasciitis 04/20/2011    Past Medical History  Diagnosis Date  . Plantar fasciitis   . Fibroadenoma   . Environmental allergies   . Anxiety   . Depression   . Allergy     Past Surgical History  Procedure Laterality Date  . Wisdom tooth extraction    . Foot surgery    . Bunionectomy Bilateral     04/09/2013 left, 06/30/2013 right,   . Plantar topaz Bilateral     04/09/2013, 06/30/2013  . Open plantar fasciotomy Right     01/16/2014  . Bunionectomy revision Right     08/2013    Social History  Substance Use Topics  . Smoking status: Never Smoker   . Smokeless tobacco: Never Used  . Alcohol Use: No    Family History   Problem Relation Age of Onset  . Diabetes Maternal Grandfather   . Hypertension Maternal Grandfather   . Stroke Maternal Grandfather   . Hyperlipidemia Maternal Grandfather   . Depression Father   . Mental illness Sister     ADHD  . Mental illness Brother     anxiety  . Cancer Paternal Grandmother     skin   Prostate cancer: father  Allergies  Allergen Reactions  . Lamictal [Lamotrigine] Rash  . Amoxicillin   . Codeine   . Tramadol Nausea And Vomiting    Medication list has been reviewed and updated.  Current Outpatient Prescriptions on File Prior to Visit  Medication Sig Dispense Refill  . ARIPiprazole (ABILIFY) 2 MG tablet Take 1 tablet (2 mg total) by mouth daily. 30 tablet 2  . fluticasone (FLONASE) 50 MCG/ACT nasal spray Place 2 sprays into both nostrils daily. 16 g 6  . Norgestimate-Ethinyl Estradiol Triphasic (ORTHO TRI-CYCLEN LO) 0.18/0.215/0.25 MG-25 MCG tab Take 1 tablet by mouth daily. 3 Package 4  . rizatriptan (MAXALT) 10 MG tablet Take 1 tablet (10 mg total) by mouth as needed for migraine. May repeat in 2 hours if needed 10 tablet 2  . venlafaxine XR (EFFEXOR-XR) 150 MG 24 hr capsule Take 1 capsule (150 mg total)  by mouth daily with breakfast. 30 capsule 2  . glucose monitoring kit (FREESTYLE) monitoring kit 1 each by Does not apply route as needed for other. (Patient not taking: Reported on 04/08/2015) 1 each 0  . hydrOXYzine (VISTARIL) 50 MG capsule Take 1 capsule (50 mg total) by mouth as needed for anxiety. (Patient not taking: Reported on 07/16/2015) 30 capsule 0  . naproxen (NAPROSYN) 500 MG tablet Reported on 07/16/2015  0   No current facility-administered medications on file prior to visit.    Review of Systems  Constitutional: Positive for malaise/fatigue. Negative for fever, chills and diaphoresis.  HENT: Negative for ear discharge, ear pain and sore throat.   Eyes: Negative for blurred vision and double vision.  Respiratory: Negative for cough,  shortness of breath and wheezing.   Cardiovascular: Negative for chest pain, palpitations and leg swelling.  Gastrointestinal: Negative for nausea, vomiting and diarrhea.  Genitourinary: Negative for dysuria, frequency and hematuria.  Skin: Negative for itching and rash.  Neurological: Negative for dizziness and headaches.   ROS otherwise unremarkable unless listed above.  Physical Examination: BP 112/80 mmHg  Pulse 84  Temp(Src) 97.5 F (36.4 C)  Resp 18  Ht 5' 7.5" (1.715 m)  Wt 274 lb (124.286 kg)  BMI 42.26 kg/m2  SpO2 97%  LMP 07/02/2015 Ideal Body Weight: Weight in (lb) to have BMI = 25: 161.7  Physical Exam  Constitutional: She is oriented to person, place, and time. She appears well-developed and well-nourished. No distress.  HENT:  Head: Normocephalic and atraumatic.  Right Ear: Tympanic membrane, external ear and ear canal normal.  Left Ear: Tympanic membrane, external ear and ear canal normal.  Nose: Right sinus exhibits no maxillary sinus tenderness and no frontal sinus tenderness. Left sinus exhibits no maxillary sinus tenderness and no frontal sinus tenderness.  Mouth/Throat: Oropharynx is clear and moist. No uvula swelling. No oropharyngeal exudate, posterior oropharyngeal edema or posterior oropharyngeal erythema.  Eyes: Conjunctivae and EOM are normal. Pupils are equal, round, and reactive to light.  Neck: Normal range of motion. Neck supple. No thyromegaly present.  Cardiovascular: Normal rate, regular rhythm, normal heart sounds and intact distal pulses.  Exam reveals no gallop, no distant heart sounds and no friction rub.   No murmur heard. Pulmonary/Chest: Effort normal and breath sounds normal. No respiratory distress. She has no decreased breath sounds. She has no wheezes. She has no rhonchi.  Abdominal: Soft. Bowel sounds are normal. She exhibits no distension and no mass. There is no tenderness.  Musculoskeletal: Normal range of motion. She exhibits no  edema or tenderness.  Lymphadenopathy:       Head (right side): No submandibular, no tonsillar, no preauricular and no posterior auricular adenopathy present.       Head (left side): No submandibular, no tonsillar, no preauricular and no posterior auricular adenopathy present.    She has no cervical adenopathy.  Neurological: She is alert and oriented to person, place, and time. No cranial nerve deficit. She exhibits normal muscle tone. Coordination normal.  Skin: Skin is warm and dry. She is not diaphoretic.  Psychiatric: She has a normal mood and affect. Her behavior is normal.     Assessment and Plan: Summer Shields is a 30 y.o. female who is here today for annual physical exam. Annual physical exam - Plan: Lipid panel, RPR, HIV antibody, CBC, TSH, COMPLETE METABOLIC PANEL WITH GFR, EKG 12-Lead  Screening for thyroid disorder - Plan: TSH  Screening for deficiency anemia - Plan:  CBC  Screening for STD (sexually transmitted disease) - Plan: RPR, HIV antibody  Screening for lipid disorders - Plan: Lipid panel  Screening for metabolic disorder - Plan: COMPLETE METABOLIC PANEL WITH GFR  Screening for cardiovascular condition - Plan: EKG 12-Lead   Ivar Drape, PA-C [facility] 07/16/2015 8:45 AM

## 2015-07-16 NOTE — Patient Instructions (Addendum)
IF you received an x-ray today, you will receive an invoice from [facility]. Please contact [facility] at [phone] with questions or concerns regarding your invoice.   IF you received labwork today, you will receive an invoice from [employer]. Please contact [facility] at [phone] with questions or concerns regarding your invoice.   Our billing staff will not be able to assist you with questions regarding bills from these companies.  You will be contacted with the lab results as soon as they are available. The fastest way to get your results is to activate your My Chart account. Instructions are located on the last page of this paperwork. If you have not heard from [place] regarding the results in 2 weeks, please contact this office.    Increase your water intake to 64 oz or more of water per day I would like you to be mindful of your carb intake.  This translates into sugar, and may be culprit to your fatigue. Please increase your exercise to 4 times per day.  Insure that you are getting 30 minutes of aerobic workout at those times.  Movement should be constant and pumping the heart.  Keeping You Healthy  Get These Tests 1. Blood Pressure- Have your blood pressure checked once a year by your health care provider.  Normal blood pressure is 120/80. 2. Weight- Have your body mass index (BMI) calculated to screen for obesity.  BMI is measure of body fat based on height and weight.  You can also calculate your own BMI at GravelBags.it. 3. Cholesterol- Have your cholesterol checked every 5 years starting at age 9 then yearly starting at age 20. 53. Chlamydia, HIV, and other sexually transmitted diseases- Get screened every year until age 85, then within three months of each new sexual provider. 5. Pap Test - Every 1-5 years; discuss with your health care provider. 6. Mammogram- Every 1-2 years starting at age 20--50  Take these  medicines  Calcium with Vitamin D-Your body needs 1200 mg of Calcium each day and [phone] IU of Vitamin D daily.  Your body can only absorb 500 mg of Calcium at a time so Calcium must be taken in 2 or 3 divided doses throughout the day.  Multivitamin with folic acid- Once daily if it is possible for you to become pregnant.  Get these Immunizations  Gardasil-Series of three doses; prevents HPV related illness such as genital warts and cervical cancer.  Menactra-Single dose; prevents meningitis.  Tetanus shot- Every 10 years.  Flu shot-Every year.  Take these steps 1. Do not smoke-Your healthcare provider can help you quit.  For tips on how to quit go to www.smokefree.gov or call 1-800 QUITNOW. 2. Be physically active- Exercise 5 days a week for at least 30 minutes.  If you are not already physically active, start slow and gradually work up to 30 minutes of moderate physical activity.  Examples of moderate activity include walking briskly, dancing, swimming, bicycling, etc. 3. Breast Cancer- A self breast exam every month is important for early detection of breast cancer.  For more information and instruction on self breast exams, ask your healthcare provider or https://www.patel.info/. 4. Eat a healthy diet- Eat a variety of healthy foods such as fruits, vegetables, whole grains, low fat milk, low fat cheeses, yogurt, lean meats, poultry and fish, beans, nuts, tofu, etc.  For more information go to www. Thenutritionsource.org 5. Drink alcohol in moderation- Limit alcohol intake to one drink or less  per day. Never drink and drive. 6. Depression- Your emotional health is as important as your physical health.  If you're feeling down or losing interest in things you normally enjoy please talk to your healthcare provider about being screened for depression. 7. Dental visit- Brush and floss your teeth twice daily; visit your dentist twice a year. 8. Eye doctor- Get an eye exam  at least every 2 years. 9. Helmet use- Always wear a helmet when riding a bicycle, motorcycle, rollerblading or skateboarding. 1. Safe sex- If you may be exposed to sexually transmitted infections, use a condom. 11. Seat belts- Seat belts can save your live; always wear one. 12. Smoke/Carbon Monoxide detectors- These detectors need to be installed on the appropriate level of your home. Replace batteries at least once a year. 13. Skin cancer- When out in the sun please cover up and use sunscreen 15 SPF or higher. 14. Violence- If anyone is threatening or hurting you, please tell your healthcare provider.

## 2015-07-17 LAB — RPR

## 2015-07-24 ENCOUNTER — Telehealth: Payer: Self-pay

## 2015-07-24 NOTE — Telephone Encounter (Signed)
Pt. Called for lab results

## 2015-07-27 NOTE — Telephone Encounter (Signed)
Please alert that her labs are normal.  Normal kidney function.  Negative std testing of the HIV and syphilis.  Thyroid in normal range.  Lipid panel is within normal range.

## 2015-07-28 ENCOUNTER — Telehealth: Payer: Self-pay

## 2015-07-28 NOTE — Telephone Encounter (Signed)
Routed to stephanie

## 2015-07-28 NOTE — Telephone Encounter (Signed)
Patient states she called twice for lab results and is still waiting on them to be reviewed. Please call patient! [phone]

## 2015-07-29 ENCOUNTER — Telehealth: Payer: Self-pay

## 2015-07-29 DIAGNOSIS — R5383 Other fatigue: Secondary | ICD-10-CM

## 2015-07-29 NOTE — Telephone Encounter (Signed)
Msg is for Summer Shields or Summer Shields, Pt wanted to get in touch with English because she noticed her results were normal but she wants to know why she is tired all the time. A sleep test was recommended by another doctor she saw. Pt would like to speak to either Summer Shields or Summer Shields please.  Please advise  [phone]

## 2015-07-29 NOTE — Telephone Encounter (Signed)
Routed to [place]

## 2015-07-30 NOTE — Telephone Encounter (Signed)
I thought these were sent.  The labs were normal.  And not contributing to your symptoms.  Normal kidney and electrolytes.  No anemia, or changes in blood cells.  Std testing normal.  Glucose unremarkable.  Cholesterol normal.

## 2015-07-30 NOTE — Telephone Encounter (Signed)
This apparently is followed by another provider, per my note, and our discussion.  I can refer her to a sleep study if she would like.  I would like her to be mindful of diet as we discussed as well.

## 2015-07-31 NOTE — Telephone Encounter (Signed)
Spoke with pt and she was informed

## 2015-07-31 NOTE — Telephone Encounter (Signed)
Patient notified and normal lab letter sent to her for her records.

## 2015-07-31 NOTE — Telephone Encounter (Signed)
Please do    thanks

## 2015-07-31 NOTE — Telephone Encounter (Signed)
SPoke with pt, advised her of the message. She would like sleep study. Order pended should I use fatigue as a diagnosis?

## 2015-08-03 NOTE — Telephone Encounter (Signed)
Referral placed. I do not see it, Summer Shields can you check?

## 2015-08-07 NOTE — Telephone Encounter (Signed)
The referral has been sent to [facility]).  The patient should be contacted once the patient's referral and notes have been reviewed.

## 2015-08-19 ENCOUNTER — Ambulatory Visit (INDEPENDENT_AMBULATORY_CARE_PROVIDER_SITE_OTHER): Payer: 59 | Admitting: Neurology

## 2015-08-19 ENCOUNTER — Encounter: Payer: Self-pay | Admitting: Neurology

## 2015-08-19 VITALS — BP 110/72 | HR 70 | Resp 16 | Ht 66.5 in | Wt 272.0 lb

## 2015-08-19 DIAGNOSIS — R0683 Snoring: Secondary | ICD-10-CM | POA: Diagnosis not present

## 2015-08-19 DIAGNOSIS — G4761 Periodic limb movement disorder: Secondary | ICD-10-CM | POA: Diagnosis not present

## 2015-08-19 DIAGNOSIS — G4750 Parasomnia, unspecified: Secondary | ICD-10-CM | POA: Diagnosis not present

## 2015-08-19 DIAGNOSIS — F518 Other sleep disorders not due to a substance or known physiological condition: Secondary | ICD-10-CM

## 2015-08-19 DIAGNOSIS — G4710 Hypersomnia, unspecified: Secondary | ICD-10-CM | POA: Diagnosis not present

## 2015-08-19 DIAGNOSIS — R6889 Other general symptoms and signs: Secondary | ICD-10-CM

## 2015-08-19 NOTE — Progress Notes (Signed)
Subjective:    Patient ID: Summer Shields is a 30 y.o. female.  HPI     Star Age, MD, PhD [facility] [address] [place], [place] [address]  Dear Colletta [place],   I saw your patient, Summer Shields, upon your kind request in my neurologic clinic today for initial consultation of her sleep disorder, in particular, concern for underlying obstructive sleep apnea. The patient is unaccompanied today. As you know, Summer Shields is a 30 year old right-handed woman with an underlying medical history of allergic rhinitis, anxiety, depression, plantar fasciitis, and morbid obesity, who reports snoring and excessive daytime somnolence. I reviewed your office note from 07/16/2015. Her growth sleepiness score is 14 out of 24 today, her fatigue score is 53 out of 63. She is single and lives alone, she has no children. She does not smoke and quit drinking alcohol about 18 months ago, she drinks caffeine in the form of coffee, 2 cups per day and typically 1 soda per day at lunch.  I reviewed her recent blood work from 07/16/15, which showed RPR nonreactive, HIV neg, normal TSH, CBC unremarkable, CMP normal with the exception of borderline liver enzymes in the 30s. Lipid profile was excellent.  She typically goes to bed around 12:30 AM, wake up time is between 8:45 and 10 AM. She does not wake up rested, on weekends she sleeps much longer. She has vivid dreams. She started having vivid dreams several years ago. She has a history of sleep talking, no sleepwalking. She is a restless sleeper but denies restless leg symptoms. Her boyfriend reports that she twitches and kicks in her sleep. She has no family history of sleep apnea or sleep disorders in general. She has mild snoring. She has gained weight in the past 2-3 years. In January 2015 she weighed 165 pounds. She denies morning headaches or nocturia. She works in Therapist, art, and has a sedentary job. She is  currently on Effexor long-acting, no longer on Abilify and no longer on Vistaril. She takes propranolol as needed for anxiety. She follows with a psychiatrist and has a Social worker as well.  Her Past Medical History Is Significant For: Past Medical History:  Diagnosis Date  . Allergy   . Anxiety   . Depression   . Environmental allergies   . Fibroadenoma   . Headache   . Plantar fasciitis     Her Past Surgical History Is Significant For: Past Surgical History:  Procedure Laterality Date  . BUNIONECTOMY Bilateral    04/09/2013 left, 06/30/2013 right,   . bunionectomy revision Right    08/2013  . FOOT SURGERY    . open plantar fasciotomy Right    01/16/2014  . plantar topaz Bilateral    04/09/2013, 06/30/2013  . TONSILLECTOMY    . WISDOM TOOTH EXTRACTION      Her Family History Is Significant For: Family History  Problem Relation Age of Onset  . Diabetes Maternal Grandfather   . Hypertension Maternal Grandfather   . Stroke Maternal Grandfather   . Hyperlipidemia Maternal Grandfather   . Depression Father   . Cancer Father   . Mental illness Sister     ADHD  . Depression Sister   . Mental illness Brother     anxiety  . Cancer Paternal Grandmother     skin  . Cancer Mother     Her Social History Is Significant For: Social History   Social History  . Marital status: Single  Spouse name: Biomedical scientist  . Number of children: 0  . Years of education: college   Social History Main Topics  . Smoking status: Never Smoker  . Smokeless tobacco: Never Used  . Alcohol use No  . Drug use: No  . Sexual activity: Yes    Birth control/ protection: Pill   Other Topics Concern  . Not on file   Social History Narrative   Marital status: single; dating seriously x 6 years      Children: none      Lives: alone      Employment:        Tobacco; none       Alcohol: none      Drugs: none      Exercise: none currently.      Caffeine: 2-3 cups a day     Her Allergies Are:   Allergies  Allergen Reactions  . Lamictal [Lamotrigine] Rash  . Amoxicillin   . Codeine   . Tramadol Nausea And Vomiting  :   Her Current Medications Are:  Outpatient Encounter Prescriptions as of 08/19/2015  Medication Sig  . fluticasone (FLONASE) 50 MCG/ACT nasal spray Place 2 sprays into both nostrils daily.  Marland Kitchen glucose monitoring kit (FREESTYLE) monitoring kit 1 each by Does not apply route as needed for other.  . naproxen (NAPROSYN) 500 MG tablet Reported on 07/16/2015  . Norgestimate-Ethinyl Estradiol Triphasic (ORTHO TRI-CYCLEN LO) 0.18/0.215/0.25 MG-25 MCG tab Take 1 tablet by mouth daily.  . propranolol (INDERAL) 20 MG tablet Take 20 mg by mouth 3 (three) times daily.  Marland Kitchen REXULTI 2 MG TABS   . rizatriptan (MAXALT) 10 MG tablet Take 1 tablet (10 mg total) by mouth as needed for migraine. May repeat in 2 hours if needed  . venlafaxine XR (EFFEXOR-XR) 150 MG 24 hr capsule Take 1 capsule (150 mg total) by mouth daily with breakfast.  . [DISCONTINUED] ARIPiprazole (ABILIFY) 2 MG tablet Take 1 tablet (2 mg total) by mouth daily.  . [DISCONTINUED] hydrOXYzine (VISTARIL) 50 MG capsule Take 1 capsule (50 mg total) by mouth as needed for anxiety. (Patient not taking: Reported on 07/16/2015)   No facility-administered encounter medications on file as of 08/19/2015.   :  Review of Systems:  Out of a complete 14 point review of systems, all are reviewed and negative with the exception of these symptoms as listed below: Review of Systems  Neurological:       No trouble falling and staying asleep, some snoring, grinds teeth, wakes up feeling tired, daytime tiredness, takes naps during day.    Epworth Sleepiness Scale 0= would never doze 1= slight chance of dozing 2= moderate chance of dozing 3= high chance of dozing  Sitting and reading:3 Watching TV:3 Sitting inactive in a public place (ex. Theater or meeting):0 As a passenger in a car for an hour without a break:2 Lying down to rest in  the afternoon:3 Sitting and talking to someone: 0 Sitting quietly after lunch (no alcohol):2 In a car, while stopped in traffic:1 Total:14  Objective:  Neurologic Exam  Physical Exam Physical Examination:   Vitals:   08/19/15 0904  BP: 110/72  Pulse: 70  Resp: 16   General Examination: The patient is a very pleasant 30 y.o. female in no acute distress. She appears well-developed and well-nourished and adequately groomed.   HEENT: Normocephalic, atraumatic, pupils are equal, round and reactive to light and accommodation. Funduscopic exam is normal with sharp disc margins noted. Extraocular tracking is  good without limitation to gaze excursion or nystagmus noted. Normal smooth pursuit is noted. Hearing is grossly intact. Tympanic membranes are clear bilaterally. Face is symmetric with normal facial animation and normal facial sensation. Speech is clear with no dysarthria noted. There is no hypophonia. There is no lip, neck/head, jaw or voice tremor. Neck is supple with full range of passive and active motion. There are no carotid bruits on auscultation. Oropharynx exam reveals: mild mouth dryness, adequate dental hygiene and mild airway crowding, due to smaller airway opening. Mallampati is class I. Tongue protrudes centrally and palate elevates symmetrically. Tonsils are absent. Neck size is 15 7/8 inches. She has a Mild overbite. Nasal inspection reveals no significant nasal mucosal bogginess or redness and no septal deviation. Nasal sounding speech.   Chest: Clear to auscultation without wheezing, rhonchi or crackles noted.  Heart: S1+S2+0, regular and normal without murmurs, rubs or gallops noted.   Abdomen: Soft, non-tender and non-distended with normal bowel sounds appreciated on auscultation.  Extremities: There is no pitting edema in the distal lower extremities bilaterally. Pedal pulses are intact.  Skin: Warm and dry without trophic changes noted. There are no varicose  veins.  Musculoskeletal: exam reveals no obvious joint deformities, tenderness or joint swelling or erythema.   Neurologically:  Mental status: The patient is awake, alert and oriented in all 4 spheres. Her immediate and remote memory, attention, language skills and fund of knowledge are appropriate. There is no evidence of aphasia, agnosia, apraxia or anomia. Speech is clear with normal prosody and enunciation. Thought process is linear. Mood is normal and affect is normal.  Cranial nerves II - XII are as described above under HEENT exam. In addition: shoulder shrug is normal with equal shoulder height noted. Motor exam: Normal bulk, strength and tone is noted. There is no drift, tremor or rebound. Romberg is negative. Reflexes are 2+ throughout. Fine motor skills and coordination: intact with normal finger taps, normal hand movements, normal rapid alternating patting, normal foot taps and normal foot agility.  Cerebellar testing: No dysmetria or intention tremor on finger to nose testing. Heel to shin is unremarkable bilaterally. There is no truncal or gait ataxia.  Sensory exam: intact to light touch, pinprick, vibration, temperature sense proprioception in the upper and lower extremities.  Gait, station and balance: She stands easily. No veering to one side is noted. No leaning to one side is noted. Posture is age-appropriate and stance is narrow based. Gait shows normal stride length and normal pace. No problems turning are noted. Tandem walk is unremarkable. Intact toe and heel stance is noted.               Assessment and Plan:  In summary, LACRESHA FUSILIER is a very pleasant 30 y.o.-year old female  with an underlying medical history of allergic rhinitis, anxiety, depression, plantar fasciitis, and morbid obesity, whose history and physical exam are somewhat concerning for obstructive sleep apnea (OSA). In addition, she may have PLMD, but denies restless leg symptoms. She has parasomnias  including sleep talking and vivid dreams. I had a long chat with the patient about my findings and the diagnosis of OSA, its prognosis and treatment options. We talked about medical treatments, surgical interventions and non-pharmacological approaches. I explained in particular the risks and ramifications of untreated moderate to severe OSA, especially with respect to developing cardiovascular disease down the Road, including congestive heart failure, difficult to treat hypertension, cardiac arrhythmias, or stroke. Even type 2 diabetes has, in part,  been linked to untreated OSA. Symptoms of untreated OSA include daytime sleepiness, memory problems, mood irritability and mood disorder such as depression and anxiety, lack of energy, as well as recurrent headaches, especially morning headaches. We talked about trying to maintain a healthy lifestyle in general, as well as the importance of weight control. I encouraged the patient to eat healthy, exercise daily and keep well hydrated, to keep a scheduled bedtime and wake time routine, to not skip any meals and eat healthy snacks in between meals. I advised the patient not to drive when feeling sleepy. I recommended the following at this time: sleep study with potential positive airway pressure titration. (We will score hypopneas at 4% and split the sleep study into diagnostic and treatment portion, if the estimated. 2 hour AHI is >20/h).   I explained the sleep test procedure to the patient and also outlined possible surgical and non-surgical treatment options of OSA, including the use of a custom-made dental device (which would require a referral to a specialist dentist or oral surgeon), upper airway surgical options, such as pillar implants, radiofrequency surgery, tongue base surgery, and UPPP (which would involve a referral to an ENT surgeon). Rarely, jaw surgery such as mandibular advancement may be considered.  I also explained the CPAP treatment option to the  patient, who indicated that she would be willing to try CPAP if the need arises. She is familiar with CPAP as her boyfriend uses a CPAP machine. I explained the importance of being compliant with PAP treatment, not only for insurance purposes but primarily to improve Her symptoms, and for the patient's long term health benefit, including to reduce Her cardiovascular risks. I answered all her questions today and the patient was in agreement. I would like to see her back after the sleep study is completed and encouraged her to call with any interim questions, concerns, problems or updates.   Thank you very much for allowing me to participate in the care of this nice patient. If I can be of any further assistance to you please do not hesitate to call me at [phone].  Sincerely,   Star Age, MD, PhD

## 2015-08-19 NOTE — Patient Instructions (Signed)

## 2015-08-30 ENCOUNTER — Other Ambulatory Visit: Payer: Self-pay | Admitting: Urgent Care

## 2015-09-09 ENCOUNTER — Ambulatory Visit (INDEPENDENT_AMBULATORY_CARE_PROVIDER_SITE_OTHER): Payer: 59 | Admitting: Physician Assistant

## 2015-09-09 ENCOUNTER — Encounter: Payer: Self-pay | Admitting: Physician Assistant

## 2015-09-09 VITALS — BP 120/72 | HR 106 | Temp 98.4°F | Resp 18 | Ht 66.5 in | Wt 270.2 lb

## 2015-09-09 DIAGNOSIS — R7989 Other specified abnormal findings of blood chemistry: Secondary | ICD-10-CM | POA: Diagnosis not present

## 2015-09-09 DIAGNOSIS — R945 Abnormal results of liver function studies: Secondary | ICD-10-CM

## 2015-09-09 DIAGNOSIS — R197 Diarrhea, unspecified: Secondary | ICD-10-CM

## 2015-09-09 DIAGNOSIS — F329 Major depressive disorder, single episode, unspecified: Secondary | ICD-10-CM | POA: Diagnosis not present

## 2015-09-09 DIAGNOSIS — R5382 Chronic fatigue, unspecified: Secondary | ICD-10-CM | POA: Diagnosis not present

## 2015-09-09 DIAGNOSIS — K5900 Constipation, unspecified: Secondary | ICD-10-CM

## 2015-09-09 DIAGNOSIS — R1011 Right upper quadrant pain: Secondary | ICD-10-CM | POA: Diagnosis not present

## 2015-09-09 LAB — T3, FREE: T3, Free: 3.4 pg/mL (ref 2.3–4.2)

## 2015-09-09 LAB — T4, FREE: Free T4: 1.2 ng/dL (ref 0.8–1.8)

## 2015-09-09 NOTE — Patient Instructions (Addendum)
  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 Please contact [facility] at [phone] with questions or concerns regarding your invoice.   IF you received labwork today, you will receive an invoice from [employer]. Please contact [facility] at [phone] with questions or concerns regarding your invoice.   Our billing staff will not be able to assist you with questions regarding bills from these companies.  You will be contacted with the lab results as soon as they are available. The fastest way to get your results is to activate your My Chart account. Instructions are located on the last page of this paperwork. If you have not heard from [place] regarding the results in 2 weeks, please contact this office.

## 2015-09-09 NOTE — Progress Notes (Signed)
Summer Shields  MRN: [id number] DOB: [birth year]  Subjective:  Pt presents to clinic for worsening depression - it is not interring with her work. She enjoys watching TV and playing video games but she is to tried to do it -- sad all the time - thoughts of suicide but no intent - thoughts of starting to see how bad it might hurt if she cut herself last night.  She has a psychiatrist that has been working with her but she feels like nothing is helping and her therapist has suggested she be seen for other causes of depression.  At her 07/16/2015 CPE fatigue was discussed and all her labs were normal except for slightly elevated LFTs (she drinks no ETOH).  She is at her wits end and will try anything but she thinks that therapy and developing coping mechanisms is just not the answer as she would like her depression fixed.  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  Good support network, minimal exercise mainly due to her feet - has eaten healthy in the past and still eats healthy but she snacks on unhealthy foods -   Guilt motivates her to not hurt/kill herself - concern for her cats and who will take care of them  psychiatrist for depression for the last 3-4 years - Dr Adele Schilder -- [facility] now but does not feel a great connection with these providers - they have not talked about any alternative treatments such as ECT for her depression since she has failed so many oral agents.   Therapist - [name] -  Sleep study is schedule - she does not snore - she is exhausted all the time -   Has chronic problems with aching joints and change from diarrhea and constipation and intermittent abd pain seemingly associated with foods - she knows when she eats  fatty/greasy foods she gets abd pain and nausea and feels bad.  She is worried about Crohn's disease causing some of her depression as she has read up on it - she is also aware that she is desperate for an explanation of why her depression is not better.  Review of Systems  Constitutional: Negative for chills and fever.  Gastrointestinal: Positive for abdominal pain, constipation, diarrhea and nausea. Negative for blood in stool.  Psychiatric/Behavioral: Positive for decreased concentration, dysphoric mood and sleep disturbance (sleeps all the time - can fall asleep at any time - she finds herself trying to stay awake while driving).    Patient Active Problem List   Diagnosis Date Noted  . Anxiety state 04/08/2015  . Allergic rhinitis due to pollen 04/03/2014  . Major depression, chronic ([place]) 05/10/2013  . Sprain, strain, toes 05/25/2012  . Metatarsalgia of both feet 04/11/2012  . Gait abnormality 04/11/2012  . Plantar fasciitis 04/20/2011    Current Outpatient Prescriptions on File Prior to Visit  Medication Sig Dispense Refill  . fluticasone (FLONASE) 50 MCG/ACT nasal spray SHAKE LIQUID AND USE 2 SPRAYS IN EACH NOSTRIL DAILY 16 g 0  . glucose monitoring kit (FREESTYLE) monitoring kit 1 each by Does not apply route as needed for other. 1 each 0  . Norgestimate-Ethinyl Estradiol Triphasic (ORTHO TRI-CYCLEN LO) 0.18/0.215/0.25 MG-25  MCG tab Take 1 tablet by mouth daily. 3 Package 4  . propranolol (INDERAL) 20 MG tablet Take 20 mg by mouth 3 (three) times daily.    Marland Kitchen REXULTI 2 MG TABS     . rizatriptan (MAXALT) 10 MG tablet Take 1 tablet (10 mg total) by mouth as needed for migraine. May repeat in 2 hours if needed 10 tablet 2  . venlafaxine XR (EFFEXOR-XR) 150 MG 24 hr capsule Take 1 capsule (150 mg total) by mouth daily with breakfast. 30 capsule 2  . naproxen (NAPROSYN) 500 MG tablet Reported on 07/16/2015  0   No current facility-administered medications on file prior to visit.      Allergies  Allergen Reactions  . Lamictal [Lamotrigine] Rash  . Amoxicillin   . Codeine   . Tramadol Nausea And Vomiting    Pt patients past, family and social history were reviewed and updated.  Objective:  BP 120/72   Pulse (!) 106   Temp 98.4 F (36.9 C) (Oral)   Resp 18   Ht 5' 6.5" (1.689 m)   Wt 270 lb 3.2 oz (122.6 kg)   LMP 08/26/2015   SpO2 96%   BMI 42.96 kg/m   Physical Exam  Constitutional: She is oriented to person, place, and time and well-developed, well-nourished, and in no distress.  HENT:  Head: Normocephalic and atraumatic.  Right Ear: Hearing and external ear normal.  Left Ear: Hearing and external ear normal.  Eyes: Conjunctivae are normal.  Neck: Normal range of motion.  Pulmonary/Chest: Effort normal.  Neurological: She is alert and oriented to person, place, and time. Gait normal.  Skin: Skin is warm and dry.  Psychiatric: Mood, memory, affect and judgment normal.  Tearful at times  Vitals reviewed.  Spent 30 mins with patient in the room with >50% spent in counseling and developing of plan Assessment and Plan :  Major depression, chronic ([place]) - Plan: T4, Free, T3, Free, Vitamin B6, Vitamin D, 25-hydroxy, Folate, Estrogens, Total, Testos,Total,Free and SHBG (Female) - ran labs that have been done at specialty clinics in the past - d/w pt to research and talk with her therapist about alternative treatments - we discussed other causes of depression that an integrative medicine provider night be able to help with as well as a specialist for genetic depression changes - names were given to the patient - she current has SI but no intent or plan - she has close f/u with her therapist and psychiatrist.  Chronic fatigue - Plan: T4, Free, T3, Free, Vitamin B6, Vitamin D, 25-hydroxy, Folate, Estrogens, Total, Testos,Total,Free and SHBG (Female), Vitamin B6 - likely related to depression  Constipation, unspecified constipation type - Plan: Ambulatory  referral to Gastroenterology, Sedimentation Rate  Diarrhea, unspecified type - Plan: Ambulatory referral to Gastroenterology, Sedimentation Rate, US Abdomen Limited RUQ - ? Role of gallbladder in her symptoms  Elevated LFTs - Plan: Hepatic function panel, US Abdomen Limited RUQ  RUQ pain - Plan: US Abdomen Limited RUQ  [name] PA-C  [facility] 09/09/2015 12:43 PM

## 2015-09-10 LAB — FOLATE: Folate: >24 ng/mL (ref >5.4)

## 2015-09-10 LAB — HEPATIC FUNCTION PANEL
ALK PHOS: 74 U/L (ref 33–115)
ALT: 19 U/L (ref 6–29)
AST: 18 U/L (ref 10–30)
Albumin: 3.8 g/dL (ref 3.6–5.1)
BILIRUBIN INDIRECT: 0.3 mg/dL (ref 0.2–1.2)
Bilirubin, Direct: 0.1 mg/dL (ref <=0.2)
TOTAL PROTEIN: 6.3 g/dL (ref 6.1–8.1)
Total Bilirubin: 0.4 mg/dL (ref 0.2–1.2)

## 2015-09-10 LAB — VITAMIN D 25 HYDROXY (VIT D DEFICIENCY, FRACTURES): Vit D, 25-Hydroxy: 17 ng/mL — ABNORMAL LOW (ref 30–100)

## 2015-09-10 LAB — SEDIMENTATION RATE: SED RATE: 1 mm/h (ref 0–20)

## 2015-09-10 LAB — VITAMIN B6

## 2015-09-12 LAB — ESTROGENS, TOTAL: ESTROGEN: 149.5 pg/mL

## 2015-09-14 LAB — VITAMIN B6: VITAMIN B6: 8.1 ng/mL (ref 2.1–21.7)

## 2015-09-14 LAB — TESTOS,TOTAL,FREE AND SHBG (FEMALE)
Sex Hormone Binding Glob.: 143 nmol/L — ABNORMAL HIGH (ref 17–124)
Testosterone, Free: 1.2 pg/mL (ref 0.1–6.4)
Testosterone,Total,LC/MS/MS: 24 ng/dL (ref 2–45)

## 2015-09-20 ENCOUNTER — Ambulatory Visit (INDEPENDENT_AMBULATORY_CARE_PROVIDER_SITE_OTHER): Payer: 59 | Admitting: Neurology

## 2015-09-20 DIAGNOSIS — G4761 Periodic limb movement disorder: Secondary | ICD-10-CM

## 2015-09-20 DIAGNOSIS — G4720 Circadian rhythm sleep disorder, unspecified type: Secondary | ICD-10-CM

## 2015-09-20 DIAGNOSIS — G479 Sleep disorder, unspecified: Secondary | ICD-10-CM

## 2015-09-20 DIAGNOSIS — G4710 Hypersomnia, unspecified: Secondary | ICD-10-CM | POA: Diagnosis not present

## 2015-09-23 NOTE — Progress Notes (Signed)
Demographics and Medical History           Name: Rut, Gilpatrick Age: 30 BMI: 43.2 Interp Physician: Star Age, MD  Pt. #: [id number] Ht-IN: 61 CM: 168 Referred By: Ivar Drape, PA  Study ID: [id number] Wt-LB: 269 KG: 122 Tested By: Lorelee Cover, RPSGT  Bed Tag: ROOM1 ScoreSet: 36 Sex: Female Scored By: [name] Na, RPSGT    Race: Caucasian   Sleep Summary    Sleep Time Statistics Minutes Hours    Time in Bed 433    7.2    Total Sleep Time 357.5    6.0    Total Sleep Time NREM 257    4.3    Total Sleep Time REM 100.5    1.7    Sleep Onset 17    0.3    Wake After Sleep Onset 58.5    1.0    Wake After Sleep Period 0    0.0    Latency Persistent Sleep 26.5    0.4    Sleep Efficiency 82.6 Percent   Sleep Disruption Events Count Index    Arousals 98 16.4    Awakenings 0 0    Arousals + Awakenings 98 16.4    REM Awakenings 2 0.3     Sleep Stage Statistics Wake N1 N2 N3 REM    Percent Stage to SPT 14.1  4.9  45.8  11.1  24.2  Percent   Sleep Period Time in Stage 58.5 20.5 190.5 46 100.5 Minutes   Latency to Stage  17 18 150.5 65.5 Minutes   Percent Stage to TST  5.7 53.3 12.9 28.1 Percent   EKG Summary          EKG Statistics         Heart Rate, Wake 111 BPM  TST Epochs in HR Interval 0 < 29   Heart Rate, Steady Sleep Avg 94 BPM   0 30-59   PAC Events 0 Count   0 60-79   PVC Events 0 Count   620 80-99   Bradycardia 0 Count   95 100-119   Tachycardia 0 Count   0 120-139        0 140-159    NREM REM   0 > 160   Shortest R-R .5 .6       Longest R-R .7 .8            Respiration Summary    Event Statistics Total  With Arousal  With Awakening    Count Index  Count Index  Count Index   Apneas, Total 0 0  0 0.0   0 0.0    Hypopneas, Total 15 2.5  9 1.5   0 0.0    Events (Apneas + Hypopneas) 15 2.5   9 1.5   0 0.0   Oximetry Statistics        SpO2, Mean Wake 98 Percent  SpO2, Range -7 Percent   SpO2, Minimum 91 Percent  SpO2 Mean 95 Percent       SpO2 Intervals > 89% 80-89% 70-79% 60-69% 50-59% 40-49% 30-39% < 30%  357.5 Percent Sleep Time 100 0 0 0 0 0 0 0  Body Position Statistics   Back Side L Side R Side Prone    Total Sleep Time   195.5 162.0 127 35 0 Minutes   Percent Time to TST   54.7  45.3  35.5  9.8  0.0  Percent   Number of Events   4  11.0 2 9 0 Count   Number of Apneas   0 0 0 0 0 Count   Number of Hypopneas   4 11 2 9  0 Count   Apnea Index   0.0  0.0  0.0  0.0  0.0  Index   Hypopnea Index   1.2  4.1  0.9  15.4  0.0  Index   Apnea + Hypopnea Index   1.2  4.1  0.9  15.4  0.0  Index    Respiration Events - Pre Treatment    Non REM, Pre Rx Statistics Non Supine  Supine    Central Mixed Obstr  Central Mixed Obstr   Apneas 0 0 0  0 0 0 Count  Apneas, Minimum SpO2 0 0 0  0 0 0 Percent     Hypopneas 0 0 1  0 0 1 Count  Hypopneas, Minimum SpO2 0 0 94  0 0 95 Percent     Apnea + Hypopneas Index 0.0 0.0 0.6  0.0 0.0 0.4 Index    REM, Pre Rx Statistics Non Supine  Supine    Central Mixed Obstr  Central Mixed Obstr   Apneas 0 0 0  0 0 0 Count  Apneas, Minimum SpO2 0 0 0  0 0 0 Percent     Hypopneas 0 0 10  0 0 3 Count  Hypopneas, Minimum SpO2 0 0 91  0 0 93 Percent     Apnea + Hypopnea Index 0.0 0.0 9.1  0.0 0.0 5.2 Index             Respiration Events - Post Treatment    Non REM, Post Rx Statistics Non Supine  Supine    Central Mixed Obstr  Central Mixed Obstr   Apneas 0 0 0  0 0 0 Count  Apneas, Minimum SpO2 0 0 0  0 0 0 Percent     Hypopneas 0 0 0  0 0 0 Count  Hypopneas, Minimum SpO2 0 0 0  0 0 0 Percent     Apnea + Hypoapnea Index 0.0 0.0 0.0  0.0 0.0 0.0 Index    REM, Post Rx Statistics Non Supine  Supine    Central Mixed Obstr  Central Mixed Obstr   Apneas 0 0 0  0 0 0 Count  Apneas, Minimum SpO2 0 0 0  0 0 0 Percent     Hypopneas 0 0 0  0 0 0 Count  Hypopneas, Minimum SpO2 0 0 0  0 0 0 Percent     Apnea + Hypopnea Index 0.0 0.0 0.0  0.0 0.0 0.0 Index   Leg  Movement Summary     LM Counts Non REM (Incl. Wake) REM Total    No Arousal Arousal Wake No Arousal Arousal Wake No Arousal Arousal Wake Total   Isolated 139 8 0 45 2 0 184 10 0 194    PLMS 59 18 0 5 0 0 64 18 0 82    Total 198 26 0 50 2 0 248 28 0 276   LM Statistics PLMS Total     Count Index Count Index    LM 82 13.8 276 46.3     LM with Arousal 18 3. 28 4.7     LM, with Wake 0 0 0 0.0     LM, Arousal + Wake 18 3.0 28 4.7     LM, No Arousal 64 10.7  248 41.6     LM, Non REM 77 18.0  224 52.3     LM, REM 5 3.0  52 31.0     Technician Comments:  Patient came in for a routine nocturnal polysomnogram with possible CPAP titration. Patient was scored using the 4% rule and splitting if AHI was >20. Patient did not meet this criteria due to lack of sleep apnea events. Patient was seen sleeping both supine and lateral positions. Patient was noted to have 3 REM cycles which her respiratory was the worst while sleeping supine and/or in REM. This was patients first sleep study and had no restroom breaks as well.

## 2015-09-24 ENCOUNTER — Telehealth: Payer: Self-pay | Admitting: Neurology

## 2015-09-24 NOTE — Progress Notes (Signed)
  Patient Name: Summer Shields, Harlan DOB: 11-13-1985 Study Date: 09/20/2015 Referred By: Ivar Drape, PA MRN: [id number]  The patient is a 30 year old Female with an underlying medical history of allergic rhinitis, anxiety, depression, plantar fasciitis, and morbid obesity, who reports snoring and excessive daytime somnolence. She is 66 inches tall and has a reported weight of 269 pounds, BMI of 43.2, neck size of 15 inches.  Medications include:  Fluticasone, Naproxen, Norgestimate, Propranolol, Rexulti, Rizatriptan and Venlafaxine.  On the night of the study, the Epworth Sleepiness Scale was 14/24.  The patient underwent an attended diagnostic digital polysomnography with the simultaneous recording of electroencephalogram, electro-oculogram, electromyogram, electrocardiogram, respiratory effort, thermister respiratory flow, nasal pressure, pulse oximetry, leg movement, body position, sound, and video.  The data was adequate for interpretation; sleep scoring followed the standards put forth by the American Academy of Sleep Medicine (AASM). ?????  SLEEP ARCHITECTURE The total time in bed was 433 minutes, the total sleep time was 357.5 minutes and WASO was 0 minutes.  Sleep efficiency was 82.6%.  The sleep latency was 17 minutes.  Latency to persistent sleep 26.5 minutes. Arousal Index was 16.4/hr. and spontaneous arousal index was 10.2/hr.  While asleep, the patient spent 5.7% of the time in stage N1 sleep, 53.3% of the time in stage N2 sleep, 12.9% of the time in stage N3 sleep, and 28.1% of the time in REM sleep, which is mildly above normal. The REM latency was 65.5, which is mildly decreased.  BODY POSITION The patient spent 195.5 minutes in the supine position, 127 minutes on the left side, 35 minutes on the right side, and 0 minutes in the prone position.  RESPIRATORY PARAMETERS There were 0 obstructive apneas, 15 hypopneas, 0 central apneas, and 0 mixed apneas.  The overall apnea-hypopnea  index (AHI) was 2.5.  The AHI supine was 1.2, the non-supine was 4.1 and the REM AHI was 7.8.  The respiratory disturbance index (RDI) was 2.5. Mild intermittent snoring was noted.   The patient's baseline O2 saturation while awake was 98% and the lowest O2 saturation observed was 91%.  The percentage of sleep time with O2 saturation at 89% or lower was 0%.      LEG MOVEMENTS There were 82 periodic leg movements.  PLMS, were observed at an average of 13.8 per hour while asleep and a PLM arousal index of 3.0 per hour.    CARDIAC EVENTS The average heart rate during sleep was 94 bpm with the highest being 117 bpm and the lowest being 55 bpm.  IMPRESSION  Periodic Limb Movement Disorder, Repetitive intrusions of sleep  Hypersomnia, unspecified   Dysfunctions associated with sleep stages or arousal from sleep?????  COMMENTS  1.  This study does not suggest any significant sleep disordered breathing. Snoring was noted, but was mild and intermittent.  2.  Mild PLMs were noted with minimal arousals - clinical correlation is recommended. 3. For all patients we encourage proper "sleep hygiene."  This includes setting a regular sleep and rise time, avoiding caffeine, alcohol and other medications that may affect sleep prior to bedtime and creating a comfortable bedroom environment for sleeping.   4. Adherence to appropriate precautions regarding sleepiness and daytime functioning is recommended.   5.  The patient will be seen in follow up in sleep clinic. ?????     _______________________________  Star Age, MD, PhD Diplomate, American Board of Sleep Medicine

## 2015-09-24 NOTE — Telephone Encounter (Signed)
I spoke to patient and she is aware of results and recommendations. I offered her an appointment next week but she was unable to take one until November. I will call if something sooner opens.

## 2015-09-24 NOTE — Telephone Encounter (Signed)
Patient referred by ms. English, seen by me on 08/19/15, diagnostic PSG on 09/20/15.   Please call and notify the patient that the recent sleep study did not show any significant obstructive sleep apnea, she had mild PLMs. Please inform patient that I would like to go over the details of the study during a follow up appointment. Arrange a followup appointment. Also, route or fax report to PCP and referring MD, if other than PCP.  Once you have spoken to patient, you can close this encounter.   Thanks,  Star Age, MD, PhD [facility])

## 2015-09-28 ENCOUNTER — Encounter (HOSPITAL_COMMUNITY): Payer: Self-pay | Admitting: Psychiatry

## 2015-09-28 ENCOUNTER — Other Ambulatory Visit (HOSPITAL_COMMUNITY): Payer: 59 | Attending: Psychiatry | Admitting: Psychiatry

## 2015-09-28 DIAGNOSIS — F411 Generalized anxiety disorder: Secondary | ICD-10-CM

## 2015-09-28 DIAGNOSIS — F332 Major depressive disorder, recurrent severe without psychotic features: Secondary | ICD-10-CM | POA: Diagnosis present

## 2015-09-28 DIAGNOSIS — F329 Major depressive disorder, single episode, unspecified: Secondary | ICD-10-CM

## 2015-09-28 NOTE — Telephone Encounter (Signed)
LM for patient to call back. We have 2 openings this week on Tuesday and Wednesday that may suit the patient. I asked her to call back ASAP if she would like to move up her appt to those times.
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
## 2015-09-29 ENCOUNTER — Other Ambulatory Visit (HOSPITAL_COMMUNITY): Payer: 59 | Admitting: Psychiatry

## 2015-09-29 DIAGNOSIS — F329 Major depressive disorder, single episode, unspecified: Secondary | ICD-10-CM

## 2015-09-29 DIAGNOSIS — F332 Major depressive disorder, recurrent severe without psychotic features: Secondary | ICD-10-CM | POA: Diagnosis not present

## 2015-09-29 NOTE — Progress Notes (Signed)
Psychiatric Initial Adult Assessment   Patient Identification: Summer Shields MRN:  [id number] Date of Evaluation:  09/29/2015 Referral Source: [name], APRN Chief Complaint:depression   Visit Diagnosis: major depression, recurrent, severe without psychotic features  History of Present Illness:   Summer Shields has had depression for years which ebbs and flows.  Currently in a down episode where she began to have suicidal thoughts with a plan to kill herself.  No intent but scared by the thoughts of planning.  No significant triggers but does dislike her job and has financial issues related to taking care of her pets who recently have required seven thousand dollars of treatment.  She loves animals and felt obligated to help them.  She is close to her boyfriend and to her father but not so much to her mother though they are cordial.  She wonders if she is cut out for work in a traditional setting at all.  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  Currently she is sad, no energy, interset, interest in usual activities.  She sleeps excessively, does not want to go out and avoids usual activities,  Feels hopeless at times.  No reason to feel this bad but she does.  Therapy and medications have been somewhat of a help but not enough to prevent the current depression.  Associated Signs/Symptoms: Depression Symptoms:  depressed mood, anhedonia, hypersomnia, fatigue, feelings of worthlessness/guilt, difficulty concentrating, hopelessness, impaired memory, suicidal thoughts without plan, anxiety, loss of energy/fatigue, (Hypo) Manic Symptoms:  Irritable Mood, Anxiety Symptoms:  worries but depresion is more prominent Psychotic Symptoms:  none PTSD Symptoms: none  Past Psychiatric History: in outpatient therapy and has taken medications over the years  Previous Psychotropic Medications: Yes   Substance Abuse  History in the last 12 months:  No.  Consequences of Substance Abuse: Negative  Past Medical History:  Past Medical History:  Diagnosis Date  . Allergy   . Anxiety   . Depression   . Environmental allergies   . Fibroadenoma   . Headache   . Plantar fasciitis     Past Surgical History:  Procedure Laterality Date  . BUNIONECTOMY Bilateral    04/09/2013 left, 06/30/2013 right,   . bunionectomy revision Right    08/2013  . FOOT SURGERY    . open plantar fasciotomy Right    01/16/2014  . plantar topaz Bilateral    04/09/2013, 06/30/2013  . TONSILLECTOMY    . WISDOM TOOTH EXTRACTION      Family Psychiatric History: none known  Family History:  Family History  Problem Relation Age of Onset  . Diabetes Maternal Grandfather   . Hypertension Maternal Grandfather   . Stroke Maternal Grandfather   . Hyperlipidemia Maternal Grandfather   . Alcohol abuse Maternal Grandfather   . Depression Father   . Cancer Father   . ADD / ADHD Sister   . Mental illness Brother     anxiety  . Anxiety disorder Brother   . Alcohol abuse Brother   . Cancer Paternal Grandmother     skin  . Cancer Mother     Social History:   Social History   Social History  . Marital status: Single    Spouse name: Biomedical scientist  . Number of children: 0  . Years of education: college   Social History Main Topics  . Smoking status: Never Smoker  .  Smokeless tobacco: Never Used  . Alcohol use No  . Drug use: No  . Sexual activity: Yes    Birth control/ protection: Pill   Other Topics Concern  . Not on file   Social History Narrative   Marital status: single; dating seriously x 6 years      Children: none      Lives: alone      Employment:        Tobacco; none       Alcohol: none      Drugs: none      Exercise: none currently.      Caffeine: 2-3 cups a day     Additional Social History: none  Allergies:   Allergies  Allergen Reactions  . Lamictal [Lamotrigine] Rash  . Amoxicillin   .  Codeine   . Tramadol Nausea And Vomiting    Metabolic Disorder Labs: Lab Results  Component Value Date   HGBA1C 5.1 10/28/2014   MPG 100 10/28/2014   No results found for: PROLACTIN Lab Results  Component Value Date   CHOL 157 07/16/2015   TRIG 123 07/16/2015   HDL 64 07/16/2015   CHOLHDL 2.5 07/16/2015   VLDL 25 07/16/2015   LDLCALC 68 07/16/2015   LDLCALC 98 11/28/2011     Current Medications: Current Outpatient Prescriptions  Medication Sig Dispense Refill  . fluticasone (FLONASE) 50 MCG/ACT nasal spray SHAKE LIQUID AND USE 2 SPRAYS IN EACH NOSTRIL DAILY 16 g 0  . glucose monitoring kit (FREESTYLE) monitoring kit 1 each by Does not apply route as needed for other. 1 each 0  . lurasidone (LATUDA) 40 MG TABS tablet Take 40 mg by mouth daily with breakfast.    . naproxen (NAPROSYN) 500 MG tablet Reported on 07/16/2015  0  . Norgestimate-Ethinyl Estradiol Triphasic (ORTHO TRI-CYCLEN LO) 0.18/0.215/0.25 MG-25 MCG tab Take 1 tablet by mouth daily. 3 Package 4  . propranolol (INDERAL) 20 MG tablet Take 20 mg by mouth 3 (three) times daily.    Marland Kitchen REXULTI 2 MG TABS     . rizatriptan (MAXALT) 10 MG tablet Take 1 tablet (10 mg total) by mouth as needed for migraine. May repeat in 2 hours if needed 10 tablet 2  . venlafaxine XR (EFFEXOR-XR) 150 MG 24 hr capsule Take 1 capsule (150 mg total) by mouth daily with breakfast. 30 capsule 2   No current facility-administered medications for this visit.     Neurologic: Headache: Negative Seizure: Negative Paresthesias:Negative  Musculoskeletal: Strength & Muscle Tone: within normal limits Gait & Station: normal Patient leans: N/A  Psychiatric Specialty Exam: ROS  Last menstrual period 08/26/2015.There is no height or weight on file to calculate BMI.  General Appearance: Well Groomed  Eye Contact:  Good  Speech:  Clear and Coherent  Volume:  Normal  Mood:  Depressed  Affect:  Congruent  Thought Process:  Coherent  Orientation:   Full (Time, Place, and Person)  Thought Content:  Logical  Suicidal Thoughts:  No  Homicidal Thoughts:  No  Memory:  Immediate;   Good Recent;   Good Remote;   Good  Judgement:  Good  Insight:  Good  Psychomotor Activity:  Normal  Concentration:  Concentration: Good and Attention Span: Good  Recall:  Good  Fund of Knowledge:Good  Language: Good  Akathisia:  Negative  Handed:  Right  AIMS (if indicated):  0  Assets:  Communication Skills Desire for [place] [facility]  Vocational/Educational  ADL's:  Intact  Cognition: WNL  Sleep:  Too much    Treatment Plan Summary: dailyn group therapy in IOP  Donnelly Angelica, MD 9/19/20172:14 PM

## 2015-09-30 ENCOUNTER — Encounter (HOSPITAL_COMMUNITY): Payer: Self-pay | Admitting: Psychiatry

## 2015-09-30 ENCOUNTER — Other Ambulatory Visit (HOSPITAL_COMMUNITY): Payer: 59 | Admitting: Psychiatry

## 2015-09-30 DIAGNOSIS — F332 Major depressive disorder, recurrent severe without psychotic features: Secondary | ICD-10-CM | POA: Diagnosis not present

## 2015-09-30 NOTE — Progress Notes (Signed)
    Daily Group Progress Note  Program: IOP  Group Time: 9:00-12:00   Participation Level:  active   Behavioral Response: engaged, responsive   Type of Therapy:   group therapy   Summary of Progress: Client reports sleeping too much. Counselor pointed out that she seems happy and upbeat, but she is also out of work for depression. Client explained that she always tries to keep up a good faade for everyone else. Client was interested in the guided imagery hypnosis activity, but visited with the psychiatrist. 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

## 2015-09-30 NOTE — Progress Notes (Signed)
  Patient ID: Summer Shields, female   DOB: [birth year], 30 y.o.   MRN: [id number]  Ms Osmanski reported suicidal thoughts on her check in report.  In talking with her these are the same thoughts she has had all along.  She has no intention of killing herself she said because she does not want to hurt her boyfriend or her animals. She does not like these thoughts and describes them as coming when she is not successful at something having a bit of a perfectionist streak as well as being part of the feeling that she is just going through the motions of a meaningless existence which is part of what she feels as depression.  The biggest cause of these feelings it seems to her is her job which she hates but feels trapped into keeping as she needs the money. She will call her therapist or her boyfriend if she feels she might act on the thoughts she says.  She has our numbers to call and is aware of 911 as well.

## 2015-09-30 NOTE — Progress Notes (Signed)
Daily Group Progress Note  Program: IOP    Group Time: 10:45-12:00 pm  Participation Level: Active  Behavioral Response: Appropriate  Type of Therapy:  Psychotherapy  Summary of Progress:  Pt participated in chair yoga, facilitated by [facility] therapist and certified yoga instructor Jan Fireman. The activity focused on breath with movement. For patients with anxiety and depression as part of self-care and mindfulness, yoga helps train the relaxation response.    Jenkins Rouge, LCAS-A

## 2015-09-30 NOTE — Progress Notes (Signed)
    Daily Group Progress Note  Program: IOP  Group Time: 9:00-12:00   Participation Level:  active   Behavioral Response: engaged   Type of Therapy:  group therapy   Summary of Progress: Client arrived late in group, but presented as very smiley and cheerful, though reporting she is severely depressed. Her therapist recommended she come to IOP, and she reports being happy with her job. Her depression began when she was 9.   Nancie Neas, LPC

## 2015-10-01 ENCOUNTER — Other Ambulatory Visit (HOSPITAL_COMMUNITY): Payer: 59 | Admitting: Psychiatry

## 2015-10-01 DIAGNOSIS — F332 Major depressive disorder, recurrent severe without psychotic features: Secondary | ICD-10-CM | POA: Diagnosis not present

## 2015-10-01 NOTE — Progress Notes (Signed)
    Daily Group Progress Note  Program: IOP  Group Time: 9:00-12:00  Participation Level: Active  Behavioral Response: Appropriate  Type of Therapy:  Group Therapy  Summary of Progress: Pt. Presented as talkative, engaged in the group process. Pt. Shared her fears of what others think of her and the energy that she spends overthinking social situations and anticipating how others might respond to her negatively. Pt. Shared that she enjoyed yoga and would do yoga exercises on a video at home to help with her anxiety. Participated in discussion about the benefits of yoga in light of yesterday's yoga session in group and the importance of developing self-care behaviors.      Nancie Neas, LPC

## 2015-10-02 ENCOUNTER — Other Ambulatory Visit (HOSPITAL_COMMUNITY): Payer: 59

## 2015-10-02 NOTE — Progress Notes (Signed)
    Daily Group Progress Note  Program: IOP  Group Time: 9:00-12:00   Participation Level:  active   Behavioral Response: engaged, responsive   Type of Therapy:  group therapy   Summary of Progress: Client reports sleeping too much once again. Client reported passive SI on her daily summary sheet. Client was seen by psychiatrist and discussed SI. The previous night client reported attempting to work on a comic, self criticizing, getting very distressed, then giving up. Client reported this often happens when she tries to do things. She also reported personalizing at work when things go wrong. Counselor challenged this faulty cognition, and discussed perfectionism with the client. Client acknowledged that she unfairly blames herself for problems that are outside of her control.    Nancie Neas, LPC

## 2015-10-03 ENCOUNTER — Encounter: Payer: Self-pay | Admitting: Physician Assistant

## 2015-10-03 DIAGNOSIS — G4761 Periodic limb movement disorder: Secondary | ICD-10-CM | POA: Insufficient documentation

## 2015-10-03 DIAGNOSIS — G4710 Hypersomnia, unspecified: Secondary | ICD-10-CM | POA: Insufficient documentation

## 2015-10-05 ENCOUNTER — Other Ambulatory Visit (HOSPITAL_COMMUNITY): Payer: 59 | Admitting: Psychiatry

## 2015-10-05 DIAGNOSIS — F332 Major depressive disorder, recurrent severe without psychotic features: Secondary | ICD-10-CM | POA: Diagnosis not present

## 2015-10-06 ENCOUNTER — Other Ambulatory Visit (HOSPITAL_COMMUNITY): Payer: 59 | Admitting: Psychiatry

## 2015-10-06 ENCOUNTER — Encounter (HOSPITAL_COMMUNITY): Payer: Self-pay

## 2015-10-06 DIAGNOSIS — F332 Major depressive disorder, recurrent severe without psychotic features: Secondary | ICD-10-CM

## 2015-10-06 NOTE — Progress Notes (Deleted)
Psychiatric Initial Adult Assessment   Patient Identification: Summer Shields MRN:  [id number] Date of Evaluation:  10/06/2015 Referral Source: Dr Modesta Messing Chief Complaint:depression  Visit Diagnosis: major depression, recurrent severe without psychotic features History of Present Illness:  Mr Summer Shields has been in this IOP program several times.  He reaches a point where his depression and anxiety prevent him from working and that is true currently as well.  He does have stress with his job with the usual increased expectations and decreased ability to control.  But he likes his job overall.  His autistic son is having serious behavior problems at school.  Finances are always an issue.  Depression presents as sadness, no energy, no motivation, excessive worry, a wish to avoid and to vegetate.  Wish to sleep too much .  No suicidal ideation.  He cannot make himself go to work and says he needs help to go to work as he needs the job.  Associated Signs/Symptoms: Depression Symptoms:  depressed mood, anhedonia, hypersomnia, fatigue, difficulty concentrating, impaired memory, anxiety, (Hypo) Manic Symptoms:  Irritable Mood, Anxiety Symptoms:  Excessive Worry, Psychotic Symptoms:  none PTSD Symptoms: Negative  Past Psychiatric History: one inpatient stay, ongoing outpatient therapy and medication treatment for years  Previous Psychotropic Medications: Yes   Substance Abuse History in the last 12 months:  No.  Consequences of Substance Abuse: Negative  Past Medical History:  Past Medical History:  Diagnosis Date  . Allergy   . Anxiety   . Depression   . Environmental allergies   . Fibroadenoma   . Headache   . Plantar fasciitis     Past Surgical History:  Procedure Laterality Date  . BUNIONECTOMY Bilateral    04/09/2013 left, 06/30/2013 right,   . bunionectomy revision Right    08/2013  . FOOT SURGERY    . open plantar fasciotomy Right    01/16/2014  . plantar topaz Bilateral     04/09/2013, 06/30/2013  . TONSILLECTOMY    . WISDOM TOOTH EXTRACTION      Family Psychiatric History: no changes  Family History:  Family History  Problem Relation Age of Onset  . Diabetes Maternal Grandfather   . Hypertension Maternal Grandfather   . Stroke Maternal Grandfather   . Hyperlipidemia Maternal Grandfather   . Alcohol abuse Maternal Grandfather   . Depression Father   . Cancer Father   . ADD / ADHD Sister   . Mental illness Brother     anxiety  . Anxiety disorder Brother   . Alcohol abuse Brother   . Cancer Paternal Grandmother     skin  . Cancer Mother     Social History:   Social History   Social History  . Marital status: Single    Spouse name: Biomedical scientist  . Number of children: 0  . Years of education: college   Social History Main Topics  . Smoking status: Never Smoker  . Smokeless tobacco: Never Used  . Alcohol use No  . Drug use: No  . Sexual activity: Yes    Birth control/ protection: Pill   Other Topics Concern  . None   Social History Narrative   Marital status: single; dating seriously x 6 years      Children: none      Lives: alone      Employment:        Tobacco; none       Alcohol: none      Drugs: none  Exercise: none currently.      Caffeine: 2-3 cups a day     Additional Social History: none  Allergies:   Allergies  Allergen Reactions  . Lamictal [Lamotrigine] Rash  . Amoxicillin   . Codeine   . Tramadol Nausea And Vomiting    Metabolic Disorder Labs: Lab Results  Component Value Date   HGBA1C 5.1 10/28/2014   MPG 100 10/28/2014   No results found for: PROLACTIN Lab Results  Component Value Date   CHOL 157 07/16/2015   TRIG 123 07/16/2015   HDL 64 07/16/2015   CHOLHDL 2.5 07/16/2015   VLDL 25 07/16/2015   LDLCALC 68 07/16/2015   LDLCALC 98 11/28/2011     Current Medications: Current Outpatient Prescriptions  Medication Sig Dispense Refill  . fluticasone (FLONASE) 50 MCG/ACT nasal spray  SHAKE LIQUID AND USE 2 SPRAYS IN EACH NOSTRIL DAILY 16 g 0  . glucose monitoring kit (FREESTYLE) monitoring kit 1 each by Does not apply route as needed for other. 1 each 0  . lurasidone (LATUDA) 40 MG TABS tablet Take 40 mg by mouth daily with breakfast.    . naproxen (NAPROSYN) 500 MG tablet Reported on 07/16/2015  0  . Norgestimate-Ethinyl Estradiol Triphasic (ORTHO TRI-CYCLEN LO) 0.18/0.215/0.25 MG-25 MCG tab Take 1 tablet by mouth daily. 3 Package 4  . propranolol (INDERAL) 20 MG tablet Take 20 mg by mouth 3 (three) times daily.    Marland Kitchen REXULTI 2 MG TABS     . rizatriptan (MAXALT) 10 MG tablet Take 1 tablet (10 mg total) by mouth as needed for migraine. May repeat in 2 hours if needed 10 tablet 2  . venlafaxine XR (EFFEXOR-XR) 150 MG 24 hr capsule Take 1 capsule (150 mg total) by mouth daily with breakfast. 30 capsule 2   No current facility-administered medications for this visit.     Neurologic: Headache: Negative Seizure: Negative Paresthesias:Negative  Musculoskeletal: Strength & Muscle Tone: within normal limits Gait & Station: normal Patient leans: N/A  Psychiatric Specialty Exam: ROS  Last menstrual period 08/26/2015.There is no height or weight on file to calculate BMI.  General Appearance: Well Groomed  Eye Contact:  Good  Speech:  Clear and Coherent  Volume:  Normal  Mood:  Depressed  Affect:  Congruent  Thought Process:  Coherent  Orientation:  Full (Time, Place, and Person)  Thought Content:  Logical  Suicidal Thoughts:  No  Homicidal Thoughts:  No  Memory:  Immediate;   Good Recent;   Good Remote;   Good  Judgement:  Intact  Insight:  Fair  Psychomotor Activity:  Normal  Concentration:  Concentration: Good and Attention Span: Good  Recall:  Good  Fund of Knowledge:Good  Language: Good  Akathisia:  Negative  Handed:  Right  AIMS (if indicated):  0  Assets:  Communication Skills Desire for Improvement Financial  Resources/Insurance Housing Intimacy Leisure Time Physical Health Social Support Talents/Skills Transportation Vocational/Educational  ADL's:  Intact  Cognition: WNL  Sleep:  Too much    Treatment Plan Summary: Admit to IOP with daily group therapy.  Continue current medications   Donnelly Angelica, MD 9/26/20171:56 PM

## 2015-10-07 ENCOUNTER — Ambulatory Visit
Admission: RE | Admit: 2015-10-07 | Discharge: 2015-10-07 | Disposition: A | Discharge status: Home / Self Care | Payer: 59 | Source: Ambulatory Visit | Attending: Physician Assistant | Admitting: Physician Assistant

## 2015-10-07 ENCOUNTER — Other Ambulatory Visit (HOSPITAL_COMMUNITY): Payer: 59 | Admitting: Psychiatry

## 2015-10-07 DIAGNOSIS — R945 Abnormal results of liver function studies: Secondary | ICD-10-CM

## 2015-10-07 DIAGNOSIS — R197 Diarrhea, unspecified: Secondary | ICD-10-CM

## 2015-10-07 DIAGNOSIS — F332 Major depressive disorder, recurrent severe without psychotic features: Secondary | ICD-10-CM

## 2015-10-07 DIAGNOSIS — R7989 Other specified abnormal findings of blood chemistry: Secondary | ICD-10-CM

## 2015-10-07 DIAGNOSIS — R1011 Right upper quadrant pain: Secondary | ICD-10-CM

## 2015-10-07 NOTE — Progress Notes (Signed)
    Daily Group Progress Note  Program: IOP  Group Time: 9:00-12:00   Participation Level:active   Behavioral Response:  engaged, responsive   Type of Therapy:   group therapy   Summary of Progress: Client share about feeling a sense of being misunderstood and invalidated by her mother. Client shared at length about a difficult situation she wound up in with her mother and boyfriend of seven years. In this situation she felt she had to side with her mother, who she felt was in the wrong, and this deeply bothered the client. Later in session during a drawing activity she drew her depression as a dark cloud resting atop her back, weighing her down.   Nancie Neas, LPC

## 2015-10-07 NOTE — Progress Notes (Signed)
    Daily Group Progress Note  Program: IOP  Group Time: 9:00-10:30  Participation Level: Active  Behavioral Response: Appropriate  Type of Therapy:  Group Therapy  Summary of Progress: Pt. Presented with bright affect, smiled and laughed appropriately. Pt. Discussed with the group her relationship with her mother, difficulty developing healthy boundaries with her mother, and pain of accepting her mother for who she is, a person who is not supportive or accepting of her mental health diagnosis, and challenged by her own poor choices.      Nancie Neas, LPC

## 2015-10-08 ENCOUNTER — Encounter (HOSPITAL_COMMUNITY): Payer: Self-pay

## 2015-10-08 ENCOUNTER — Other Ambulatory Visit (HOSPITAL_COMMUNITY): Payer: 59 | Admitting: Psychiatry

## 2015-10-08 NOTE — Progress Notes (Signed)
Summer Shields is a 30 y.o. , single, Caucasian, employed female, who was referred per therapist [name], [facility]); treatment for depressive  symptoms with SI.  Pt continues to struggle daily with SI (Plan to cut).  Discussed safety options with pt.  Pt is able to contract for safety.  According to pt, her depressive symptoms flucuates.  "I've been tried on different meds.  They will work for [employer], but then stop."  Pt states she's been struggling with depressive sxs for two years.  States she's been depressed off and on since age 8.  Triggers/Stressors:  1)Job ([employer]) of four months.  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  States the job isn't bad and the people are nice, but she doesn't know if she can work in any job at this time.  Pt states she was only trained for two weeks and was left to figure things out alone.    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  2)  Financial Strain:  Two pets are causing a financial strain for pt.  The rabbit just had $4,000 worth of things done and currently the cat is accruing $3,200 so far.  Pt states she is lover of animals, but it's demanding a lot out of her.  3)  Conflictual relationship with mother.  CC: Childhood  Pt denies prior psychiatric hospitalizations and suicide attempts or gestures.  Has been seeing [name], RNC for ~ one yr and [name], NP at the [facility] for ~ six months.  Was previously with Dr. Adele Schilder.  "We didn't click."  PCP:  [name], PA-C.   Although, pt had a few more approved days in MH-IOP; pt is requesting discharge today.  Pt wouldn't even attend any groups today.  Stated that the program hasn't helped her in any way.  Pt seemed to be participating and enjoying the groups up until Dr. Lovena Le consulted with her about [place] treatment yesterday.  Pt is upset and disappointed that she didn't meet the criteria.   According to Dr. Lovena Le, pt's depression is driven by her personality and he declined her to obtain [place] at [facility].  Dr. Lovena Le did recommend that pt follow up for a second opinion with Dr. Reddy's office.  A:  D/C today.  F/U with [name], RN next week and make an appointment to see [name], NP at [facility].  Encouraged support groups.  Highly recommend DBT.  R:  Pt receptive.    Carlis Abbott, RITA, M.Ed, CNA

## 2015-10-08 NOTE — Patient Instructions (Signed)
Patient requesting discharge today.  Reports that the program wasn't helpful.  Encouraged support groups.  F/U with [name], RNC and [name], NP at the [facility].

## 2015-10-08 NOTE — Progress Notes (Signed)
Patient ID: Summer Shields, female   DOB: 03-16-1985, 30 y.o.   MRN: [id number] IOP Discharge:  Date of admission was 09/28/2015 Date if discharge:  10/08/2015  IOP Course:  Summer Shields attended and participated.  She said the experience was not helpful at all to her, it seemed that people here just do not care, some people mad unprofessional comments and she learned nothing she did not already know.  She was really disappointed at not being accepted for [place].  I referred her to Dr Reece Levy who is now providing [place] in his office and he might have a different conclusion.   Mental status.  Sad, says she is just as depressed as when she came in.  She has daily suicidal thoughts but no intent or plan though that can change on the spot she has said before. Final diagnosis:  Major depression, recurrent severe without psychotic features

## 2015-10-09 ENCOUNTER — Other Ambulatory Visit (HOSPITAL_COMMUNITY): Payer: 59

## 2015-10-09 NOTE — Progress Notes (Signed)
    Daily Group Progress Note  Program: IOP  Group Time: 9:00-12:00   Participation Level:  active   Behavioral Response: engaged, responsive   Type of Therapy:  group therapy   Summary of Progress: Client shared about how her depression manifests. She explained the feelings of hopelessness she has, and her apathy and fatigue, along with a sense of dissatisfaction. Discussing feeling as if she has to "wear a mask".   Nancie Neas, LPC

## 2015-10-12 ENCOUNTER — Other Ambulatory Visit (HOSPITAL_COMMUNITY): Payer: 59 | Admitting: Psychiatry

## 2015-10-13 ENCOUNTER — Other Ambulatory Visit (HOSPITAL_COMMUNITY): Payer: 59

## 2015-10-14 ENCOUNTER — Other Ambulatory Visit (HOSPITAL_COMMUNITY): Payer: 59

## 2015-10-15 ENCOUNTER — Other Ambulatory Visit (HOSPITAL_COMMUNITY): Payer: 59

## 2015-10-16 ENCOUNTER — Other Ambulatory Visit (HOSPITAL_COMMUNITY): Payer: 59

## 2015-10-19 ENCOUNTER — Other Ambulatory Visit (HOSPITAL_COMMUNITY): Payer: 59

## 2015-10-20 ENCOUNTER — Other Ambulatory Visit (HOSPITAL_COMMUNITY): Payer: 59

## 2015-10-21 ENCOUNTER — Other Ambulatory Visit (HOSPITAL_COMMUNITY): Payer: 59

## 2015-10-22 ENCOUNTER — Other Ambulatory Visit (HOSPITAL_COMMUNITY): Payer: 59

## 2015-10-23 ENCOUNTER — Other Ambulatory Visit (HOSPITAL_COMMUNITY): Payer: 59

## 2015-10-26 ENCOUNTER — Other Ambulatory Visit (HOSPITAL_COMMUNITY): Payer: 59

## 2015-10-27 ENCOUNTER — Other Ambulatory Visit (HOSPITAL_COMMUNITY): Payer: 59

## 2015-10-28 ENCOUNTER — Other Ambulatory Visit (HOSPITAL_COMMUNITY): Payer: 59

## 2015-10-29 ENCOUNTER — Other Ambulatory Visit (HOSPITAL_COMMUNITY): Payer: 59

## 2015-10-30 ENCOUNTER — Other Ambulatory Visit (HOSPITAL_COMMUNITY): Payer: 59

## 2015-11-02 ENCOUNTER — Other Ambulatory Visit (HOSPITAL_COMMUNITY): Payer: 59

## 2015-11-03 ENCOUNTER — Other Ambulatory Visit (HOSPITAL_COMMUNITY): Payer: 59

## 2015-11-04 ENCOUNTER — Other Ambulatory Visit (HOSPITAL_COMMUNITY): Payer: 59

## 2015-11-05 ENCOUNTER — Other Ambulatory Visit (HOSPITAL_COMMUNITY): Payer: 59

## 2015-11-06 ENCOUNTER — Other Ambulatory Visit (HOSPITAL_COMMUNITY): Payer: 59

## 2015-11-09 ENCOUNTER — Other Ambulatory Visit (HOSPITAL_COMMUNITY): Payer: 59

## 2015-11-10 ENCOUNTER — Other Ambulatory Visit (HOSPITAL_COMMUNITY): Payer: 59

## 2015-11-11 ENCOUNTER — Other Ambulatory Visit (HOSPITAL_COMMUNITY): Payer: 59

## 2015-11-12 ENCOUNTER — Other Ambulatory Visit (HOSPITAL_COMMUNITY): Payer: 59

## 2015-11-13 ENCOUNTER — Other Ambulatory Visit (HOSPITAL_COMMUNITY): Payer: 59

## 2015-11-16 ENCOUNTER — Other Ambulatory Visit (HOSPITAL_COMMUNITY): Payer: 59

## 2015-11-17 ENCOUNTER — Other Ambulatory Visit (HOSPITAL_COMMUNITY): Payer: 59

## 2015-11-18 ENCOUNTER — Other Ambulatory Visit (HOSPITAL_COMMUNITY): Payer: 59

## 2015-11-19 ENCOUNTER — Other Ambulatory Visit (HOSPITAL_COMMUNITY): Payer: 59

## 2015-11-20 ENCOUNTER — Other Ambulatory Visit (HOSPITAL_COMMUNITY): Payer: 59

## 2015-11-23 ENCOUNTER — Other Ambulatory Visit (HOSPITAL_COMMUNITY): Payer: 59

## 2015-12-01 ENCOUNTER — Ambulatory Visit: Payer: Self-pay | Admitting: Neurology

## 2016-01-21 ENCOUNTER — Encounter: Payer: Self-pay | Admitting: Physician Assistant

## 2016-01-21 ENCOUNTER — Ambulatory Visit (INDEPENDENT_AMBULATORY_CARE_PROVIDER_SITE_OTHER): Payer: 59 | Admitting: Physician Assistant

## 2016-01-21 VITALS — BP 136/80 | HR 110 | Temp 99.7°F | Resp 18 | Ht 66.5 in | Wt 265.0 lb

## 2016-01-21 DIAGNOSIS — H578 Other specified disorders of eye and adnexa: Secondary | ICD-10-CM

## 2016-01-21 DIAGNOSIS — H5711 Ocular pain, right eye: Secondary | ICD-10-CM | POA: Diagnosis not present

## 2016-01-21 DIAGNOSIS — J3089 Other allergic rhinitis: Secondary | ICD-10-CM | POA: Diagnosis not present

## 2016-01-21 DIAGNOSIS — F331 Major depressive disorder, recurrent, moderate: Secondary | ICD-10-CM

## 2016-01-21 DIAGNOSIS — H5789 Other specified disorders of eye and adnexa: Secondary | ICD-10-CM

## 2016-01-21 MED ORDER — VENLAFAXINE HCL ER 150 MG PO CP24: 150.0000 mg | ORAL_CAPSULE | Freq: Every day | ORAL | 1 refills | Status: DC

## 2016-01-21 MED ORDER — FLUTICASONE PROPIONATE 50 MCG/ACT NA SUSP: NASAL | 3 refills | Status: DC

## 2016-01-21 MED ORDER — LURASIDONE HCL 40 MG PO TABS: 40.0000 mg | ORAL_TABLET | Freq: Every day | ORAL | 1 refills | Status: DC

## 2016-01-21 NOTE — Patient Instructions (Addendum)
Nighttime ointment and daytime lubrication while waiting on the eye appt  Encourage to sign up for mychart    IF you received an x-ray today, you will receive an invoice from [facility]. Please contact [facility] at [phone] with questions or concerns regarding your invoice.   IF you received labwork today, you will receive an invoice from [facility]. Please contact [facility] at [phone] with questions or concerns regarding your invoice.   Our billing staff will not be able to assist you with questions regarding bills from these companies.  You will be contacted with the lab results as soon as they are available. The fastest way to get your results is to activate your My Chart account. Instructions are located on the last page of this paperwork. If you have not heard from [place] regarding the results in 2 weeks, please contact this office.

## 2016-01-21 NOTE — Progress Notes (Signed)
Summer Shields  MRN: [id number] DOB: September 04, 1985  Subjective:  Pt presents to clinic with discomfort in her right eye for about a week that has come and gone.  For about a week it has been uncomfortable to move her eyes and then yesterday she noticed a blister on the eye as well as redness in the outside corner.  Today the blister has decreased as well as the pain.  She has done nothing for the discomfort or the blister.    She would also like to have me refill her depression medications.  She [facility] but she does not feel like she has a great relationship with her and she is stable and doing well now so she was wondering if I could take over her Rxs.  She wears glasses and is having no problems with her vision.  Review of Systems  Constitutional: Negative for chills and fever.  Eyes: Positive for pain and redness. Negative for photophobia, discharge, itching and visual disturbance.    Patient Active Problem List   Diagnosis Date Noted  . Hypersomnia 10/03/2015  . Periodic limb movement 10/03/2015  . Anxiety state 04/08/2015  . Allergic rhinitis due to pollen 04/03/2014  . Major depression, chronic ([place]) 05/10/2013  . Sprain, strain, toes 05/25/2012  . Metatarsalgia of both feet 04/11/2012  . Gait abnormality 04/11/2012  . Plantar fasciitis 04/20/2011    Current Outpatient Prescriptions on File Prior to Visit  Medication Sig Dispense Refill  . Norgestimate-Ethinyl Estradiol Triphasic (ORTHO TRI-CYCLEN LO) 0.18/0.215/0.25 MG-25 MCG tab Take 1 tablet by mouth daily. 3 Package 4  . propranolol (INDERAL) 20 MG tablet Take 20 mg by mouth 3 (three) times daily.     No current facility-administered medications on file prior to visit.     Allergies  Allergen Reactions  . Lamictal [Lamotrigine] Rash  . Amoxicillin   . Codeine   . Tramadol Nausea And Vomiting    Pt patients past, family and social history were reviewed and updated.   Objective:  BP 136/80 (BP Location: Right Arm, Patient Position: Sitting, Cuff Size: Large)   Pulse (!) 110   Temp 99.7 F (37.6 C) (Oral)   Resp 18   Ht 5' 6.5" (1.689 m)   Wt 265 lb (120.2 kg)   LMP 01/14/2016   SpO2 95%   BMI 42.13 kg/m   Physical Exam  Constitutional: She is oriented to person, place, and time and well-developed, well-nourished, and in no distress.  HENT:  Head: Normocephalic and atraumatic.  Right Ear: Hearing and external ear normal.  Left Ear: Hearing and external ear normal.  Eyes: Conjunctivae and EOM are normal. Pupils are equal, round, and reactive to light.    Mild TTP on right globe of eye lateral side>medial side.  Eyelids close completely when she closes her eyes.  Neck: Normal range of motion.  Pulmonary/Chest: Effort normal.  Neurological: She is alert and oriented to person, place, and time. Gait normal.  Skin: Skin is warm and dry.  Psychiatric: Mood, memory, affect and judgment normal.  Vitals reviewed.    Visual Acuity Screening   Right eye Left eye Both eyes  Without correction:     With correction: 20/20 20/15 20/15     Assessment and Plan :  Eye pain, right - Plan: Ambulatory referral to Ophthalmology - ? Cause but we will use lubrication to decrease possible dryness - we will do referral do to globe pain  Irritation of  right eye - Plan: Ambulatory referral to Ophthalmology  Major depressive disorder, recurrent episode, moderate ([place]) - Plan: venlafaxine XR (EFFEXOR-XR) 150 MG 24 hr capsule, lurasidone (LATUDA) 40 MG TABS tablet - ok to refill medications as long   Chronic non-seasonal allergic rhinitis, unspecified trigger - Plan: fluticasone (FLONASE) 50 MCG/ACT nasal spray  [name] PA-C  [facility] [place] 01/21/2016 2:37 PM

## 2016-04-12 ENCOUNTER — Other Ambulatory Visit: Payer: Self-pay | Admitting: Physician Assistant

## 2016-04-12 DIAGNOSIS — Z3009 Encounter for other general counseling and advice on contraception: Secondary | ICD-10-CM

## 2016-04-25 ENCOUNTER — Telehealth: Payer: Self-pay | Admitting: Family Medicine

## 2016-04-25 NOTE — Telephone Encounter (Signed)
LMOM TO CALL AND RESCHEDULE APPT THAT WAS ON 07-19-16 WITH WEBER SHE WILL BE OUT OF TOWN THAT DAY

## 2016-06-05 ENCOUNTER — Other Ambulatory Visit: Payer: Self-pay | Admitting: Physician Assistant

## 2016-06-05 DIAGNOSIS — Z3009 Encounter for other general counseling and advice on contraception: Secondary | ICD-10-CM

## 2016-07-14 ENCOUNTER — Ambulatory Visit: Payer: 59 | Admitting: Physician Assistant

## 2016-07-19 ENCOUNTER — Ambulatory Visit: Payer: 59 | Admitting: Physician Assistant

## 2016-08-13 ENCOUNTER — Other Ambulatory Visit: Payer: Self-pay | Admitting: Physician Assistant

## 2016-08-13 DIAGNOSIS — F331 Major depressive disorder, recurrent, moderate: Secondary | ICD-10-CM

## 2016-08-15 ENCOUNTER — Other Ambulatory Visit: Payer: Self-pay | Admitting: Emergency Medicine

## 2016-08-15 ENCOUNTER — Telehealth: Payer: Self-pay | Admitting: Physician Assistant

## 2016-08-15 DIAGNOSIS — F331 Major depressive disorder, recurrent, moderate: Secondary | ICD-10-CM

## 2016-08-15 MED ORDER — VENLAFAXINE HCL ER 150 MG PO CP24: 150.0000 mg | ORAL_CAPSULE | Freq: Every day | ORAL | 0 refills | Status: DC

## 2016-08-15 NOTE — Progress Notes (Unsigned)
Spoke with patient- Apologized script was sent to incorrect pharmacy.  Pharmacy changed and updated in chart.

## 2016-08-15 NOTE — Telephone Encounter (Signed)
Per medication record rx sent correct pharmacy.

## 2016-08-15 NOTE — Telephone Encounter (Signed)
SARAH CALLED IN PATIENT'S VELAFAXINE XR 150 MG THIS MORNING. IT WENT TO [facility] ON [address]. SHE SAID SHE HAS TOLD us THAT SHE USES [facility] ON [address]. PATIENT IS VERY UPSET ABOUT THE SERVICE HERE AND REQUESTED TO SPEAK TO A MANAGER. NIKKI SPOKE WITH HER. BEST PHONE [phone] (CELL) PHARMACY CHOICE IS [facility] ON [address] [place]

## 2016-08-15 NOTE — Telephone Encounter (Signed)
Call from answering service - she has been out of her medications since Friday and she has read that she could have problems.  She was instructed to make an appt by calling the office Monday am and her meds would be sent to the pharmacy at [facility] am.

## 2016-09-08 ENCOUNTER — Ambulatory Visit (INDEPENDENT_AMBULATORY_CARE_PROVIDER_SITE_OTHER): Payer: 59 | Admitting: Physician Assistant

## 2016-09-08 ENCOUNTER — Encounter: Payer: Self-pay | Admitting: Physician Assistant

## 2016-09-08 VITALS — BP 118/84 | HR 92 | Temp 98.5°F | Resp 18 | Ht 66.5 in | Wt 264.6 lb

## 2016-09-08 DIAGNOSIS — Z3009 Encounter for other general counseling and advice on contraception: Secondary | ICD-10-CM

## 2016-09-08 DIAGNOSIS — F331 Major depressive disorder, recurrent, moderate: Secondary | ICD-10-CM | POA: Diagnosis not present

## 2016-09-08 DIAGNOSIS — Z23 Encounter for immunization: Secondary | ICD-10-CM

## 2016-09-08 DIAGNOSIS — Z79899 Other long term (current) drug therapy: Secondary | ICD-10-CM

## 2016-09-08 MED ORDER — NORGESTIM-ETH ESTRAD TRIPHASIC 0.18/0.215/0.25 MG-25 MCG PO TABS: 1.0000 | ORAL_TABLET | Freq: Every day | ORAL | 4 refills | Status: DC

## 2016-09-08 MED ORDER — LURASIDONE HCL 40 MG PO TABS: 40.0000 mg | ORAL_TABLET | Freq: Every day | ORAL | 1 refills | Status: DC

## 2016-09-08 MED ORDER — VENLAFAXINE HCL ER 37.5 MG PO CP24: 112.5000 mg | ORAL_CAPSULE | Freq: Every day | ORAL | 0 refills | Status: DC

## 2016-09-08 NOTE — Patient Instructions (Addendum)
     IF you received an x-ray today, you will receive an invoice from [facility]. Please contact [facility] at [phone] with questions or concerns regarding your invoice.   IF you received labwork today, you will receive an invoice from [place]. Please contact [facility] at [phone] with questions or concerns regarding your invoice.   Our billing staff will not be able to assist you with questions regarding bills from these companies.  You will be contacted with the lab results as soon as they are available. The fastest way to get your results is to activate your My Chart account. Instructions are located on the last page of this paperwork. If you have not heard from [place] regarding the results in 2 weeks, please contact this office.    Decreased Effexor down to 112.5mg  for titration of Effexor

## 2016-09-08 NOTE — Progress Notes (Signed)
Summer Shields  MRN: [id number] DOB: 26-Nov-1985  PCP: Joretta Bachelor, PA  Chief Complaint  Patient presents with  . Medication Refill    effexor-xr and trinessa lo  . Depression    screening was a 9     Subjective:  Pt presents to clinic for medication refill.  Her depression is better as she has [place] therapy since her last visit.  She feels like it helped with her mood but she still has no energy and low motivation.  Since her [place] she was hoping to try to decrease her Effexor to see if she still needs it.  She is still going to therapy which she still finds helpful.  She is having minimal anxiety.  She needs refills on her OCP.  She would like to stay on the Latuda.  History is obtained by patient.  Review of Systems  Constitutional: Negative for chills and fever.  Psychiatric/Behavioral: Positive for dysphoric mood. Negative for sleep disturbance. The patient is not nervous/anxious.     Patient Active Problem List   Diagnosis Date Noted  . Hypersomnia 10/03/2015  . Periodic limb movement 10/03/2015  . Anxiety state 04/08/2015  . Allergic rhinitis due to pollen 04/03/2014  . Major depression, chronic ([place]) 05/10/2013  . Sprain, strain, toes 05/25/2012  . Metatarsalgia of both feet 04/11/2012  . Gait abnormality 04/11/2012  . Plantar fasciitis 04/20/2011    Current Outpatient Prescriptions on File Prior to Visit  Medication Sig Dispense Refill  . fluticasone (FLONASE) 50 MCG/ACT nasal spray SHAKE LIQUID AND USE 2 SPRAYS IN EACH NOSTRIL DAILY 48 g 3  . propranolol (INDERAL) 20 MG tablet Take 20 mg by mouth 3 (three) times daily.     No current facility-administered medications on file prior to visit.     Allergies  Allergen Reactions  . Lamictal [Lamotrigine] Rash  . Amoxicillin   . Codeine   . Tramadol Nausea And Vomiting    Past Medical History:  Diagnosis Date  . Allergy   . Anxiety   . Depression   . Environmental allergies   . Fibroadenoma   .  Headache   . Plantar fasciitis    Social History   Social History Narrative   Marital status: single; dating seriously x 6 years      Children: none      Lives: alone      Employment:        Tobacco; none       Alcohol: none      Drugs: none      Exercise: none currently.      Caffeine: 2-3 cups a day    Social History  Substance Use Topics  . Smoking status: Never Smoker  . Smokeless tobacco: Never Used  . Alcohol use No   family history includes ADD / ADHD in her sister; Alcohol abuse in her brother and maternal grandfather; Anxiety disorder in her brother; Cancer in her father, mother, and paternal grandmother; Depression in her father; Diabetes in her maternal grandfather; Hyperlipidemia in her maternal grandfather; Hypertension in her maternal grandfather; Mental illness in her brother; Stroke in her maternal grandfather.     Objective:  BP 118/84   Pulse 92   Temp 98.5 F (36.9 C) (Oral)   Resp 18   Ht 5' 6.5" (1.689 m)   Wt 264 lb 9.6 oz (120 kg)   LMP 08/25/2016   SpO2 96%   BMI 42.07 kg/m  Body mass index is 42.07  kg/m.  Physical Exam  Constitutional: She is oriented to person, place, and time and well-developed, well-nourished, and in no distress.  HENT:  Head: Normocephalic and atraumatic.  Right Ear: Hearing and external ear normal.  Left Ear: Hearing and external ear normal.  Eyes: Conjunctivae are normal.  Neck: Normal range of motion.  Cardiovascular: Normal rate, regular rhythm and normal heart sounds.   No murmur heard. Pulmonary/Chest: Effort normal and breath sounds normal. She has no wheezes.  Neurological: She is alert and oriented to person, place, and time. Gait normal.  Skin: Skin is warm and dry.  Psychiatric: Mood, memory, affect and judgment normal.  Vitals reviewed.   Assessment and Plan :  Major depressive disorder, recurrent episode, moderate (HCC) - Plan: venlafaxine XR (EFFEXOR-XR) 37.5 MG 24 hr capsule, lurasidone (LATUDA) 40  MG TABS tablet -- we will try to increase her Effexor from 150mg  --> 112.5mg  - she will try this for a month and if she has success we will decrease further.  She will continue talk therapy.  Medication management  Need for influenza vaccination - Plan: Flu Vaccine QUAD 36+ mos IM  Birth control counseling - Plan: Norgestimate-Ethinyl Estradiol Triphasic (TRINESSA LO) 0.18/0.215/0.25 MG-25 MCG tab  [name] PA-C  [facility] at [place] 09/08/2016 9:26 AM

## 2016-09-11 ENCOUNTER — Other Ambulatory Visit: Payer: Self-pay | Admitting: Physician Assistant

## 2016-09-11 DIAGNOSIS — F331 Major depressive disorder, recurrent, moderate: Secondary | ICD-10-CM

## 2016-09-19 ENCOUNTER — Telehealth: Payer: Self-pay

## 2016-09-19 ENCOUNTER — Telehealth: Payer: Self-pay | Admitting: Physician Assistant

## 2016-09-19 NOTE — Telephone Encounter (Signed)
Spoke with Pt about pinched nerve. Pt was wanting to know if she should come in about a pain in her left elbow and was wondering if she can wait til her next appt on 10/11/2016. I advised the Pt to follow her gut and if she felt like she couldn't wait til her appointment that she was more than welcome to make a appointment.

## 2016-09-19 NOTE — Telephone Encounter (Signed)
Pt is needing to talk with someone regarding a pinch nerve and that she has an upcoming an appt and is wanting to know if it wait til then to be seen   Best number [phone]

## 2016-09-27 ENCOUNTER — Ambulatory Visit (INDEPENDENT_AMBULATORY_CARE_PROVIDER_SITE_OTHER): Payer: 59 | Admitting: Physician Assistant

## 2016-09-27 ENCOUNTER — Encounter: Payer: Self-pay | Admitting: Physician Assistant

## 2016-09-27 ENCOUNTER — Other Ambulatory Visit: Payer: Self-pay | Admitting: Physician Assistant

## 2016-09-27 VITALS — BP 124/84 | HR 94 | Temp 98.3°F | Resp 18 | Ht 66.5 in | Wt 261.2 lb

## 2016-09-27 DIAGNOSIS — M25532 Pain in left wrist: Secondary | ICD-10-CM | POA: Diagnosis not present

## 2016-09-27 DIAGNOSIS — G5622 Lesion of ulnar nerve, left upper limb: Secondary | ICD-10-CM

## 2016-09-27 DIAGNOSIS — M25531 Pain in right wrist: Secondary | ICD-10-CM | POA: Diagnosis not present

## 2016-09-27 DIAGNOSIS — F331 Major depressive disorder, recurrent, moderate: Secondary | ICD-10-CM

## 2016-09-27 MED ORDER — MELOXICAM 7.5 MG PO TABS: 7.5000 mg | ORAL_TABLET | Freq: Every day | ORAL | 0 refills | Status: DC

## 2016-09-27 MED ORDER — VENLAFAXINE HCL ER 37.5 MG PO CP24: 112.5000 mg | ORAL_CAPSULE | Freq: Every day | ORAL | 4 refills | Status: DC

## 2016-09-27 NOTE — Progress Notes (Signed)
Summer Shields  MRN: [id number] DOB: 24-Nov-1985  PCP: Joretta Bachelor, PA  Chief Complaint  Patient presents with  . Elbow Injury    pionched nerve in left elbow x2weeks     Subjective:  Pt presents to clinic for 2 weeks.   She had been resting on the elbow - and then she stopped and it has gotten better but now resolved - pain in elbow and numbness in left 5th digit - with tingling/ pins and needles and asleep.   Right handed - now pain her right wrist>left wrist.  She has ordered a different keyboard and mouse. She sleeps without wrist flexed to the best of her knowledge  She has never had problems with carpal tunnel - started about 1 week ago - she uses keyboard at work.   History is obtained by patient.   Review of Systems  Neurological: Negative for weakness. Light-headedness: sensation change.    Patient Active Problem List   Diagnosis Date Noted  . Hypersomnia 10/03/2015  . Periodic limb movement 10/03/2015  . Anxiety state 04/08/2015  . Allergic rhinitis due to pollen 04/03/2014  . Major depression, chronic ([place]) 05/10/2013  . Sprain, strain, toes 05/25/2012  . Metatarsalgia of both feet 04/11/2012  . Gait abnormality 04/11/2012  . Plantar fasciitis 04/20/2011    Current Outpatient Prescriptions on File Prior to Visit  Medication Sig Dispense Refill  . fluticasone (FLONASE) 50 MCG/ACT nasal spray SHAKE LIQUID AND USE 2 SPRAYS IN EACH NOSTRIL DAILY 48 g 3  . lurasidone (LATUDA) 40 MG TABS tablet Take 1 tablet (40 mg total) by mouth daily with breakfast. 90 tablet 1  . Norgestimate-Ethinyl Estradiol Triphasic (TRINESSA LO) 0.18/0.215/0.25 MG-25 MCG tab Take 1 tablet by mouth daily. 3 Package 4  . propranolol (INDERAL) 20 MG tablet Take 20 mg by mouth 3 (three) times daily.     No current facility-administered medications on file prior to visit.     Allergies  Allergen Reactions  . Lamictal [Lamotrigine] Rash  . Amoxicillin   . Codeine   . Tramadol  Nausea And Vomiting    Past Medical History:  Diagnosis Date  . Allergy   . Anxiety   . Depression   . Environmental allergies   . Fibroadenoma   . Headache   . Plantar fasciitis    Social History   Social History Narrative   Marital status: single; dating seriously x 6 years      Children: none      Lives: alone      Employment:        Tobacco; none       Alcohol: none      Drugs: none      Exercise: none currently.      Caffeine: 2-3 cups a day    Social History  Substance Use Topics  . Smoking status: Never Smoker  . Smokeless tobacco: Never Used  . Alcohol use No   family history includes ADD / ADHD in her sister; Alcohol abuse in her brother and maternal grandfather; Anxiety disorder in her brother; Cancer in her father, mother, and paternal grandmother; Depression in her father; Diabetes in her maternal grandfather; Hyperlipidemia in her maternal grandfather; Hypertension in her maternal grandfather; Mental illness in her brother; Stroke in her maternal grandfather.     Objective:  BP 124/84   Pulse 94   Temp 98.3 F (36.8 C) (Oral)   Resp 18   Ht 5' 6.5" (1.689 m)  Wt 261 lb 3.2 oz (118.5 kg)   LMP 09/23/2016   SpO2 97%   BMI 41.53 kg/m  Body mass index is 41.53 kg/m.  Physical Exam  Constitutional: She is oriented to person, place, and time and well-developed, well-nourished, and in no distress.  HENT:  Head: Normocephalic and atraumatic.  Right Ear: Hearing and external ear normal.  Left Ear: Hearing and external ear normal.  Eyes: Conjunctivae are normal.  Neck: Normal range of motion.  Pulmonary/Chest: Effort normal.  Musculoskeletal:  Neg tinels and phalens bilaterally.  No TTP of wrists bilateral.  Left elbow some mild TTP over ulnar nerve but no TTP over lateral/medial epicondyle. Decrease sensation in the left 5th finger pad and flexor surface of the finger.   Neurological: She is alert and oriented to person, place, and time. Gait normal.    Skin: Skin is warm and dry.  Psychiatric: Memory, affect and judgment normal. She exhibits a depressed mood (but stable).  Vitals reviewed.   Assessment and Plan :  Pain in both wrists  Ulnar neuropathy at elbow of left upper extremity - Plan: meloxicam (MOBIC) 7.5 MG tablet - this is acute and she has made changes to the positioning of the wrist and she is already improved in regards to her symptoms.  She will continue these changes - NSAIDs should also help  Major depressive disorder, recurrent episode, moderate (HCC) - Plan: venlafaxine XR (EFFEXOR-XR) 37.5 MG 24 hr capsule - continue current medications -   [name] PA-C  [facility] at [place] 09/30/2016 1:59 PM

## 2016-09-27 NOTE — Patient Instructions (Addendum)
IF you received an x-ray today, you will receive an invoice from [facility]. Please contact [facility] at [phone] with questions or concerns regarding your invoice.   IF you received labwork today, you will receive an invoice from [place]. Please contact [facility] at [phone] with questions or concerns regarding your invoice.   Our billing staff will not be able to assist you with questions regarding bills from these companies.  You will be contacted with the lab results as soon as they are available. The fastest way to get your results is to activate your My Chart account. Instructions are located on the last page of this paperwork. If you have not heard from [place] regarding the results in 2 weeks, please contact this office.    Cubital Tunnel Syndrome Cubital tunnel syndrome is a condition that causes pain and weakness of the forearm and hand. This condition happens when one of the nerves (ulnar nerve) that runs alongside the elbow joint becomes irritated. What are the causes? Causes of this condition include:  Increased pressure on the ulnar nerve at the elbow, arm, or forearm. This can be caused by: ? Swollen tissues. ? Ligaments. ? Muscles. ? Poorly healed elbow fractures. ? Tumors in the elbow. These are usually noncancerous (benign). ? Scar tissue that develops in the elbow after an injury. ? Bony growths (spurs) near the ulnar nerve.  Stretching of the nerve due to loose elbow ligaments.  Trauma to the nerve at the elbow.  Repetitive elbow bending.  Certain medical conditions.  What increases the risk? This condition is more likely to develop in:  People who do manual labor that requires frequently bending the elbow.  People who play sports that include repeated or strenuous throwing motions, such as baseball.  People who play contact sports, such as football or lacrosse.  People who do not warm up properly before activities.  People  who have diabetes.  People who have an underactive thyroid (hypothyroidism).  What are the signs or symptoms? Symptoms of this condition include:  Clumsiness or weakness of the hand.  Tenderness of the inner elbow.  Aching or soreness of the inner elbow, forearm, or fingers, especially the little finger or the ring finger.  Increased pain with forced elbow bending.  Reduced control when throwing.  Tingling, numbness, or burning inside the forearm, or in part of the hand or fingers, especially the little finger or the ring finger.  Sharp pains that shoot from the elbow down to the wrist and hand.  The inability to grip or pinch hard.  How is this diagnosed? This condition is diagnosed with a medical history and physical exam. Your health care provider will ask about your symptoms and ask for details about any injury. You may also have other tests, including:  Electromyogram (EMG). This test checks how well the nerve is working.  X-ray.  How is this treated? Treatment starts by stopping the activities that are causing your symptoms to get worse. Treatment may include the use of icing and medicines to reduce pain and swelling. You may also be advised to wear a splint to prevent your elbow from bending or wear an elbow pad where the ulnar nerve is closest to the skin. In less severe cases, treatment may also include working with a physical therapist:  To help decrease your symptoms.  To improve the strength and range of motion of your elbow, forearm, and hand.  If the treatments described above do not help,  surgery may be needed. Follow these instructions at home: If you have a splint:  Wear it as told by your health care provider. Remove it only as told by your health care provider.  Loosen the splint if your fingers become numb and tingle, or if they turn cold and blue.  Keep the splint clean and dry. Managing pain, stiffness, and swelling  If directed, apply ice to the  injured area: ? Put ice in a plastic bag. ? Place a towel between your skin and the bag. ? Leave the ice on for 20 minutes, 2-3 times per day.  Move your fingers often to avoid stiffness and to lessen swelling.  Raise (elevate) the injured area above the level of your heart while you are sitting or lying down. General instructions  Take over-the-counter and prescription medicines only as told by your health care provider.  Keep all follow-up visits as told by your health care provider. This is important.  Do any exercise or physical therapy as told by your health care provider.  Do not drive or operate heavy machinery while taking prescription pain medicine.  If you were given an elbow pad, wear it as told by your health care provider. Contact a health care provider if:  Your symptoms get worse.  Your symptoms do not get better with treatment.  Your have new pain.  Your hand on the injured side feels numb or cold. This information is not intended to replace advice given to you by your health care provider. Make sure you discuss any questions you have with your health care provider. Document Released: 12/27/2004 Document Revised: 06/04/2015 Document Reviewed: 03/05/2014 Elsevier Interactive Patient Education  [employer].

## 2016-10-11 ENCOUNTER — Encounter: Payer: Self-pay | Admitting: Physician Assistant

## 2016-10-11 ENCOUNTER — Ambulatory Visit (INDEPENDENT_AMBULATORY_CARE_PROVIDER_SITE_OTHER): Payer: 59 | Admitting: Physician Assistant

## 2016-10-11 VITALS — BP 124/78 | HR 105 | Temp 98.9°F | Resp 18 | Ht 66.5 in | Wt 266.0 lb

## 2016-10-11 DIAGNOSIS — G4710 Hypersomnia, unspecified: Secondary | ICD-10-CM | POA: Diagnosis not present

## 2016-10-11 DIAGNOSIS — M25531 Pain in right wrist: Secondary | ICD-10-CM

## 2016-10-11 DIAGNOSIS — G5622 Lesion of ulnar nerve, left upper limb: Secondary | ICD-10-CM | POA: Diagnosis not present

## 2016-10-11 DIAGNOSIS — M25532 Pain in left wrist: Secondary | ICD-10-CM

## 2016-10-11 DIAGNOSIS — F331 Major depressive disorder, recurrent, moderate: Secondary | ICD-10-CM | POA: Diagnosis not present

## 2016-10-11 MED ORDER — VENLAFAXINE HCL ER 75 MG PO CP24: 75.0000 mg | ORAL_CAPSULE | Freq: Every day | ORAL | 1 refills | Status: DC

## 2016-10-11 NOTE — Progress Notes (Signed)
Summer Shields  MRN: [id number] DOB: 1985-11-01  PCP: Mancel Bale, PA-C  Chief Complaint  Patient presents with  . Depression    follow up from 08/30 screening was a 9 today     Subjective:  Pt presents to clinic for medication recheck.  She has decreased her Effexor from 150mg  to 112.5mg  in hopes to get off that medication since she went through [place] therapy.  Her typical depression has always been low motivation or energy - but now she is just teary for the last month - she thinks that it is getting better overall.  It is more sentimental and not sad crying.   Elbow pain- improved - she has continued to change her position at her work - the numbness has resolved.  She is still extremely fatigued all the time - 8 hours of sleep at night - has had a sleep study which showed no sleep apnea - hard to not sleep more on the weekends but she has tried and it does not help - she can sleep and take naps and still have no problems falling asleep.  She does not snore.  She never wakes up rested.  She can nap at any time and then fall asleep at any time.  She has to be actively engaged to not be able to fall asleep.  She has fallen asleep at the wheel once.  She finds that she has to arose herself - she is aware of her groggy and she shakes her head to wake up.  She can watch TV but only if she is engaged 100%.  She can read and not fall asleep.  In order to fall asleep she has to stop what is she doing - she feels tired and then stops doing her activity and then falls asleep.  She drinks a cup of coffee in the am, a soda at lunch and a rare soda in the evening - drinking caffeine does not change her fatigue level. When she drinks more she feels "like a squirrel who has done cocaine" - she gets really jittery but no increased energy.  [place] -- Dr Caprice Beaver (has not been more fatigued since her treatment) - thinks her fatigue is not related to her depression Referral to Pauline Good for continued  psychiatry care.  History is obtained by patient.  Review of Systems  Constitutional: Negative for chills and fever.  Psychiatric/Behavioral: Positive for dysphoric mood (much improved andnot worse since decreasing her effexor) and sleep disturbance. Negative for suicidal ideas. The patient is not nervous/anxious.     Patient Active Problem List   Diagnosis Date Noted  . Hypersomnia 10/03/2015  . Periodic limb movement 10/03/2015  . Anxiety state 04/08/2015  . Allergic rhinitis due to pollen 04/03/2014  . Major depression, chronic ([place]) 05/10/2013  . Sprain, strain, toes 05/25/2012  . Metatarsalgia of both feet 04/11/2012  . Gait abnormality 04/11/2012  . Plantar fasciitis 04/20/2011    Current Outpatient Prescriptions on File Prior to Visit  Medication Sig Dispense Refill  . fluticasone (FLONASE) 50 MCG/ACT nasal spray SHAKE LIQUID AND USE 2 SPRAYS IN EACH NOSTRIL DAILY 48 g 3  . lurasidone (LATUDA) 40 MG TABS tablet Take 1 tablet (40 mg total) by mouth daily with breakfast. 90 tablet 1  . meloxicam (MOBIC) 7.5 MG tablet Take 1-2 tablets (7.5-15 mg total) by mouth daily. 30 tablet 0  . Norgestimate-Ethinyl Estradiol Triphasic (TRINESSA LO) 0.18/0.215/0.25 MG-25 MCG tab Take 1 tablet by mouth  daily. 3 Package 4  . propranolol (INDERAL) 20 MG tablet Take 20 mg by mouth 3 (three) times daily.    Marland Kitchen venlafaxine XR (EFFEXOR-XR) 37.5 MG 24 hr capsule Take 3 capsules (112.5 mg total) by mouth daily with breakfast. - needs 112.5mg  for titration purposes 90 capsule 4   No current facility-administered medications on file prior to visit.     Allergies  Allergen Reactions  . Lamictal [Lamotrigine] Rash  . Amoxicillin   . Codeine   . Tramadol Nausea And Vomiting    Past Medical History:  Diagnosis Date  . Allergy   . Anxiety   . Depression   . Environmental allergies   . Fibroadenoma   . Headache   . Plantar fasciitis    Social History   Social History Narrative   Marital  status: single; dating seriously x 6 years      Children: none      Lives: alone      Employment:        Tobacco; none       Alcohol: none      Drugs: none      Exercise: none currently.      Caffeine: 2-3 cups a day    Social History  Substance Use Topics  . Smoking status: Never Smoker  . Smokeless tobacco: Never Used  . Alcohol use No   family history includes ADD / ADHD in her sister; Alcohol abuse in her brother and maternal grandfather; Anxiety disorder in her brother; Cancer in her father, mother, and paternal grandmother; Depression in her father; Diabetes in her maternal grandfather; Hyperlipidemia in her maternal grandfather; Hypertension in her maternal grandfather; Mental illness in her brother; Stroke in her maternal grandfather.     Objective:  BP 124/78   Pulse (!) 105   Temp 98.9 F (37.2 C) (Oral)   Resp 18   Ht 5' 6.5" (1.689 m)   Wt 266 lb (120.7 kg)   LMP 09/23/2016   SpO2 97%   BMI 42.29 kg/m  Body mass index is 42.29 kg/m.  Physical Exam  Constitutional: She is oriented to person, place, and time and well-developed, well-nourished, and in no distress.  HENT:  Head: Normocephalic and atraumatic.  Right Ear: Hearing and external ear normal.  Left Ear: Hearing and external ear normal.  Eyes: Conjunctivae are normal.  Neck: Normal range of motion.  Cardiovascular: Normal rate, regular rhythm and normal heart sounds.   No murmur heard. Pulmonary/Chest: Effort normal and breath sounds normal. She has no wheezes.  Neurological: She is alert and oriented to person, place, and time. Gait normal.  Skin: Skin is warm and dry.  Psychiatric: Mood, memory, affect and judgment normal.  Vitals reviewed.   Assessment and Plan :  Ulnar neuropathy at elbow of left upper extremity -  Resolved with decreased pressure on the ulnar nerve while resting her elbow - she is still trying to train herself  Hypersomnolence - Plan: TSH, VITAMIN D 25 Hydroxy (Vit-D  Deficiency, Fractures), Ambulatory referral to Neurology - does not sound like narcolepsy and she does not have sleep apnea but ? Some other sort of sleep disorder resulting in hypersomnolence - refer for evaluation and possible treatment  Major depressive disorder, recurrent episode, moderate ([place]) - Plan: venlafaxine XR (EFFEXOR XR) 75 MG 24 hr capsule - she is supposed to have her menses next week - continue Effexor 112.5mg  for the next 2 weeks to give a comparison of whether her emotions  are better after being on the lower dose of effexor for longer - then decrease to Effexor 75mg  - I will see her in 8 weeks to allow for another menses for comparison to see how she is doing on the decreased dose.  Pain in both wrists - improved  [name] PA-C  [facility] at [place] 10/11/2016 11:05 AM

## 2016-10-11 NOTE — Patient Instructions (Signed)
     IF you received an x-ray today, you will receive an invoice from [facility]. Please contact [facility] at [phone] with questions or concerns regarding your invoice.   IF you received labwork today, you will receive an invoice from [facility]. Please contact [facility] at [phone] with questions or concerns regarding your invoice.   Our billing staff will not be able to assist you with questions regarding bills from these companies.  You will be contacted with the lab results as soon as they are available. The fastest way to get your results is to activate your My Chart account. Instructions are located on the last page of this paperwork. If you have not heard from us regarding the results in 2 weeks, please contact this office.     

## 2016-10-12 LAB — VITAMIN D 25 HYDROXY (VIT D DEFICIENCY, FRACTURES): VIT D 25 HYDROXY: 52.5 ng/mL (ref 30.0–100.0)

## 2016-10-12 LAB — TSH: TSH: 3.29 u[IU]/mL (ref 0.450–4.500)

## 2016-10-13 ENCOUNTER — Encounter: Payer: Self-pay | Admitting: *Deleted

## 2016-11-10 ENCOUNTER — Telehealth: Payer: Self-pay | Admitting: Family Medicine

## 2016-11-10 NOTE — Telephone Encounter (Signed)
Summer Shields from [facility] called asking if it was okay to schedule the pt with Dr. Rexene Alberts for her sleep study referral instead of Dr. Brett Fairy. The pt's referral was sent for Dr. Brett Fairy but the pt has previously seen Dr. Rexene Alberts and Vita Barley said they would need to keep the pt with Dr. Rexene Alberts. Please advise. Vita Barley can be reached at [phone]. Thanks!

## 2016-11-14 NOTE — Telephone Encounter (Signed)
That is perfectly fine with me  Judson Roch

## 2016-11-14 NOTE — Telephone Encounter (Signed)
Sheena advised. Thanks!

## 2016-12-06 ENCOUNTER — Ambulatory Visit: Payer: 59 | Admitting: Physician Assistant

## 2016-12-08 ENCOUNTER — Other Ambulatory Visit: Payer: Self-pay | Admitting: Physician Assistant

## 2016-12-08 DIAGNOSIS — F331 Major depressive disorder, recurrent, moderate: Secondary | ICD-10-CM

## 2017-01-05 ENCOUNTER — Encounter: Payer: Self-pay | Admitting: Family Medicine

## 2017-01-05 ENCOUNTER — Other Ambulatory Visit: Payer: Self-pay

## 2017-01-05 ENCOUNTER — Ambulatory Visit (INDEPENDENT_AMBULATORY_CARE_PROVIDER_SITE_OTHER): Payer: 59 | Admitting: Family Medicine

## 2017-01-05 VITALS — BP 132/64 | HR 94 | Temp 98.5°F | Ht 68.11 in | Wt 279.0 lb

## 2017-01-05 DIAGNOSIS — R059 Cough, unspecified: Secondary | ICD-10-CM

## 2017-01-05 DIAGNOSIS — J3089 Other allergic rhinitis: Secondary | ICD-10-CM | POA: Diagnosis not present

## 2017-01-05 DIAGNOSIS — R05 Cough: Secondary | ICD-10-CM

## 2017-01-05 DIAGNOSIS — J029 Acute pharyngitis, unspecified: Secondary | ICD-10-CM | POA: Diagnosis not present

## 2017-01-05 LAB — POCT RAPID STREP A (OFFICE): Rapid Strep A Screen: NEGATIVE

## 2017-01-05 MED ORDER — BENZONATATE 200 MG PO CAPS: 200.0000 mg | ORAL_CAPSULE | Freq: Two times a day (BID) | ORAL | 0 refills | Status: DC | PRN

## 2017-01-05 MED ORDER — FLUTICASONE PROPIONATE 50 MCG/ACT NA SUSP: NASAL | 3 refills | Status: DC

## 2017-01-05 NOTE — Progress Notes (Signed)
12/27/20188:21 AM  Summer Shields March 22, 1985, 31 y.o. female [id number]  Chief Complaint  Patient presents with  . Cough    CAUSING LARYNGITIS    HPI:   Patient is a 31 y.o. female who presents today for 2 weeks of non productive cough, mild sinus pressure and PND. Felt she had a cold at the start of 2 weeks which resolved but cough has lingered. Denies any SOB, fever or chills. Has a mild sore throat and lost her voice several days ago. Has been taking mucinex wo much relief. Has not been using her flonase of recent.   Depression screen [facility] 2/9 01/05/2017 10/11/2016 09/27/2016  Decreased Interest 0 1 1  Down, Depressed, Hopeless 0 1 1  PHQ - 2 Score 0 2 2  Altered sleeping - 3 -  Tired, decreased energy - 3 -  Change in appetite - 0 -  Feeling bad or failure about yourself  - 0 -  Trouble concentrating - 1 -  Moving slowly or fidgety/restless - 0 -  Suicidal thoughts - 0 -  PHQ-9 Score - 9 -  Difficult doing work/chores - - -    Allergies  Allergen Reactions  . Lamictal [Lamotrigine] Rash  . Amoxicillin   . Codeine   . Tramadol Nausea And Vomiting    Prior to Admission medications   Medication Sig Start Date End Date Taking? Authorizing Provider  Norgestimate-Ethinyl Estradiol Triphasic (TRINESSA LO) 0.18/0.215/0.25 MG-25 MCG tab Take 1 tablet by mouth daily. 09/08/16  Yes Weber, Damaris Hippo, PA-C  venlafaxine XR (EFFEXOR-XR) 37.5 MG 24 hr capsule Take 3 capsules (112.5 mg total) by mouth daily with breakfast. - needs 112.5mg  for titration purposes 09/27/16  Yes Weber, Sarah L, PA-C  venlafaxine XR (EFFEXOR-XR) 75 MG 24 hr capsule TAKE 1 CAPSULE(75 MG) BY MOUTH DAILY WITH BREAKFAST 12/08/16  Yes Weber, Damaris Hippo, PA-C    Past Medical History:  Diagnosis Date  . Allergy   . Anxiety   . Depression   . Environmental allergies   . Fibroadenoma   . Headache   . Plantar fasciitis     Past Surgical History:  Procedure Laterality Date  . BUNIONECTOMY Bilateral    04/09/2013 left, 06/30/2013 right,   . bunionectomy revision Right    08/2013  . FOOT SURGERY    . open plantar fasciotomy Right    01/16/2014  . plantar topaz Bilateral    04/09/2013, 06/30/2013  . TONSILLECTOMY    . WISDOM TOOTH EXTRACTION      Social History   Tobacco Use  . Smoking status: Never Smoker  . Smokeless tobacco: Never Used  Substance Use Topics  . Alcohol use: No    Alcohol/week: 0.0 oz    Family History  Problem Relation Age of Onset  . Diabetes Maternal Grandfather   . Hypertension Maternal Grandfather   . Stroke Maternal Grandfather   . Hyperlipidemia Maternal Grandfather   . Alcohol abuse Maternal Grandfather   . Depression Father   . Cancer Father   . ADD / ADHD Sister   . Mental illness Brother        anxiety  . Anxiety disorder Brother   . Alcohol abuse Brother   . Cancer Paternal Grandmother        skin  . Cancer Mother     ROS Per hpi  OBJECTIVE:  Blood pressure 132/64, pulse 94, temperature 98.5 F (36.9 C), temperature source Oral, height 5' 8.11" (1.73 m), weight 279 lb (126.6  kg), SpO2 98 %.  Physical Exam  Constitutional: She is oriented to person, place, and time and well-developed, well-nourished, and in no distress.  HENT:  Head: Normocephalic and atraumatic.  Right Ear: Hearing, tympanic membrane, external ear and ear canal normal.  Left Ear: Hearing, tympanic membrane, external ear and ear canal normal.  Nose: Mucosal edema and rhinorrhea present. Right sinus exhibits maxillary sinus tenderness and frontal sinus tenderness. Left sinus exhibits maxillary sinus tenderness and frontal sinus tenderness.  Mouth/Throat: Mucous membranes are normal. Posterior oropharyngeal edema and posterior oropharyngeal erythema present. No oropharyngeal exudate.  Mild sinus tenderness  Eyes: EOM are normal. Pupils are equal, round, and reactive to light.  Neck: Neck supple.  Cardiovascular: Normal rate, regular rhythm and normal heart sounds. Exam  reveals no gallop and no friction rub.  No murmur heard. Pulmonary/Chest: Effort normal and breath sounds normal. She has no wheezes. She has no rales.  Lymphadenopathy:    She has no cervical adenopathy.  Neurological: She is alert and oriented to person, place, and time. Gait normal.  Skin: Skin is warm and dry.    Results for orders placed or performed in visit on 01/05/17 (from the past 24 hour(s))  POCT rapid strep A     Status: None   Collection Time: 01/05/17  8:25 AM  Result Value Ref Range   Rapid Strep A Screen Negative Negative      ASSESSMENT and PLAN 1. Cough Discussed post-viral, cont with supportive measures. Adding cough suppressant. RTC precautions reviewed.   2. Sore throat - POCT rapid strep A - negative  3. Chronic non-seasonal allergic rhinitis - fluticasone (FLONASE) 50 MCG/ACT nasal spray; SHAKE LIQUID AND USE 2 SPRAYS IN EACH NOSTRIL DAILY  Other orders - benzonatate (TESSALON) 200 MG capsule; Take 1 capsule (200 mg total) by mouth 2 (two) times daily as needed for cough.  Return if symptoms worsen or fail to improve.    Rutherford Guys, MD Primary Care at [place], [place] [address] Ph.  [phone] Fax [phone]

## 2017-01-05 NOTE — Patient Instructions (Addendum)
1. Restart flonase, drink lots of warm liquids, use humidifier at night    IF you received an x-ray today, you will receive an invoice from [facility]. Please contact [facility] at [phone] with questions or concerns regarding your invoice.   IF you received labwork today, you will receive an invoice from [facility]. Please contact [facility] at [phone] with questions or concerns regarding your invoice.   Our billing staff will not be able to assist you with questions regarding bills from these companies.  You will be contacted with the lab results as soon as they are available. The fastest way to get your results is to activate your My Chart account. Instructions are located on the last page of this paperwork. If you have not heard from [place] regarding the results in 2 weeks, please contact this office.

## 2017-01-09 ENCOUNTER — Telehealth: Payer: Self-pay | Admitting: Physician Assistant

## 2017-01-09 DIAGNOSIS — F331 Major depressive disorder, recurrent, moderate: Secondary | ICD-10-CM

## 2017-01-09 NOTE — Telephone Encounter (Signed)
Call -- I approved a 30 day supply refill of Effexor XR 75mg  daily as she is due for follow-up with [name], PA-C.  Please schedule follow-up OV with Judson Roch in the upcoming 30 days.

## 2017-01-09 NOTE — Telephone Encounter (Signed)
Called pt to let her know about the refill and try to get an appt scheduled with Judson Roch. She stated that she will be seeing her psychiatrist coming up soon and she thinks that she will be taking care of all her prescriptions.

## 2017-02-09 ENCOUNTER — Ambulatory Visit (INDEPENDENT_AMBULATORY_CARE_PROVIDER_SITE_OTHER): Payer: 59 | Admitting: Physician Assistant

## 2017-02-09 ENCOUNTER — Other Ambulatory Visit: Payer: Self-pay

## 2017-02-09 VITALS — BP 118/88 | HR 119 | Temp 99.1°F | Resp 16 | Ht 68.0 in | Wt 274.0 lb

## 2017-02-09 DIAGNOSIS — J029 Acute pharyngitis, unspecified: Secondary | ICD-10-CM

## 2017-02-09 DIAGNOSIS — J101 Influenza due to other identified influenza virus with other respiratory manifestations: Secondary | ICD-10-CM

## 2017-02-09 LAB — POCT RAPID STREP A (OFFICE): RAPID STREP A SCREEN: NEGATIVE

## 2017-02-09 LAB — POCT INFLUENZA A/B
INFLUENZA B, POC: NEGATIVE
Influenza A, POC: POSITIVE — AB

## 2017-02-09 MED ORDER — GUAIFENESIN ER 1200 MG PO TB12: 1.0000 | ORAL_TABLET | Freq: Two times a day (BID) | ORAL | 1 refills | Status: DC | PRN

## 2017-02-09 MED ORDER — OSELTAMIVIR PHOSPHATE 75 MG PO CAPS: 75.0000 mg | ORAL_CAPSULE | Freq: Two times a day (BID) | ORAL | 0 refills | Status: DC

## 2017-02-09 MED ORDER — IPRATROPIUM BROMIDE 0.03 % NA SOLN
2.0000 | Freq: Two times a day (BID) | NASAL | 0 refills | Status: DC
Start: 2017-02-09 — End: 2017-03-12

## 2017-02-09 NOTE — Progress Notes (Signed)
PRIMARY CARE AT [facility] [address] [place] [address] [phone]  Date:  02/09/2017   Name:  Summer Shields   DOB:  01-27-1985   MRN:  [id number]  PCP:  Mancel Bale, PA-C    History of Present Illness:  Summer Shields is a 32 y.o. female patient who presents to PCP with  Chief Complaint  Patient presents with  . Cough    x 2 days/ congestion, pt housewhole is sick  . Sore Throat    x 2 days  . Sinusitis    x 3 days  . Fatigue    it more lately than usual     3 days ago, coughing and blowing nose.  seh has sinus pressure of her face.  She has noted a temperature of 99.  Clear mucus.  She is feeling very tired.  She has difficulty with sleeping due to having trouble with her breathing.  Her throat feels dry.  She coughing with congestion.   Chloraseptic cough drop and dayquil.  Work has sick contacts with bronchitis, and influenza.    Patient Active Problem List   Diagnosis Date Noted  . Hypersomnia 10/03/2015  . Periodic limb movement 10/03/2015  . Anxiety state 04/08/2015  . Allergic rhinitis due to pollen 04/03/2014  . Major depression, chronic ([place]) 05/10/2013  . Sprain, strain, toes 05/25/2012  . Metatarsalgia of both feet 04/11/2012  . Gait abnormality 04/11/2012  . Plantar fasciitis 04/20/2011    Past Medical History:  Diagnosis Date  . Allergy   . Anxiety   . Depression   . Environmental allergies   . Fibroadenoma   . Headache   . Plantar fasciitis     Past Surgical History:  Procedure Laterality Date  . BUNIONECTOMY Bilateral    04/09/2013 left, 06/30/2013 right,   . bunionectomy revision Right    08/2013  . FOOT SURGERY    . open plantar fasciotomy Right    01/16/2014  . plantar topaz Bilateral    04/09/2013, 06/30/2013  . TONSILLECTOMY    . WISDOM TOOTH EXTRACTION      Social History   Tobacco Use  . Smoking status: Never Smoker  . Smokeless tobacco: Never Used  Substance Use Topics  . Alcohol use: No    Alcohol/week: 0.0 oz  .  Drug use: No    Family History  Problem Relation Age of Onset  . Diabetes Maternal Grandfather   . Hypertension Maternal Grandfather   . Stroke Maternal Grandfather   . Hyperlipidemia Maternal Grandfather   . Alcohol abuse Maternal Grandfather   . Depression Father   . Cancer Father   . ADD / ADHD Sister   . Mental illness Brother        anxiety  . Anxiety disorder Brother   . Alcohol abuse Brother   . Cancer Paternal Grandmother        skin  . Cancer Mother     Allergies  Allergen Reactions  . Lamictal [Lamotrigine] Rash  . Amoxicillin   . Codeine   . Tramadol Nausea And Vomiting    Medication list has been reviewed and updated.  Current Outpatient Medications on File Prior to Visit  Medication Sig Dispense Refill  . fluticasone (FLONASE) 50 MCG/ACT nasal spray SHAKE LIQUID AND USE 2 SPRAYS IN EACH NOSTRIL DAILY 16 g 3  . Norgestimate-Ethinyl Estradiol Triphasic (TRINESSA LO) 0.18/0.215/0.25 MG-25 MCG tab Take 1 tablet by mouth daily. 3 Package 4  . venlafaxine XR (  EFFEXOR-XR) 37.5 MG 24 hr capsule Take 3 capsules (112.5 mg total) by mouth daily with breakfast. - needs 112.5mg  for titration purposes 90 capsule 4  . venlafaxine XR (EFFEXOR-XR) 75 MG 24 hr capsule TAKE 1 CAPSULE(75 MG) BY MOUTH DAILY WITH BREAKFAST 30 capsule 0  . benzonatate (TESSALON) 200 MG capsule Take 1 capsule (200 mg total) by mouth 2 (two) times daily as needed for cough. (Patient not taking: Reported on 02/09/2017) 20 capsule 0   No current facility-administered medications on file prior to visit.     ROS ROS otherwise unremarkable unless listed above.  Physical Examination: BP 118/88   Pulse (!) 119   Temp 99.1 F (37.3 C) (Oral)   Resp 16   Ht 5\' 8"  (1.727 m)   Wt 274 lb (124.3 kg)   LMP 02/09/2017   SpO2 97%   BMI 41.66 kg/m  Ideal Body Weight: Weight in (lb) to have BMI = 25: 164.1  Physical Exam  Constitutional: She is oriented to person, place, and time. She appears  well-developed and well-nourished. No distress.  HENT:  Head: Normocephalic and atraumatic.  Right Ear: Tympanic membrane, external ear and ear canal normal.  Left Ear: Tympanic membrane, external ear and ear canal normal.  Nose: Mucosal edema and rhinorrhea present. Right sinus exhibits no maxillary sinus tenderness and no frontal sinus tenderness. Left sinus exhibits no maxillary sinus tenderness and no frontal sinus tenderness.  Mouth/Throat: No uvula swelling. No oropharyngeal exudate, posterior oropharyngeal edema or posterior oropharyngeal erythema.  Eyes: Conjunctivae and EOM are normal. Pupils are equal, round, and reactive to light.  Cardiovascular: Normal rate and regular rhythm. Exam reveals no gallop, no distant heart sounds and no friction rub.  No murmur heard. Pulmonary/Chest: Effort normal. No respiratory distress. She has no decreased breath sounds. She has no wheezes. She has no rhonchi.  Lymphadenopathy:       Head (right side): No submandibular, no tonsillar, no preauricular and no posterior auricular adenopathy present.       Head (left side): No submandibular, no tonsillar, no preauricular and no posterior auricular adenopathy present.  Neurological: She is alert and oriented to person, place, and time.  Skin: She is not diaphoretic.  Psychiatric: She has a normal mood and affect. Her behavior is normal.   Results for orders placed or performed in visit on 02/09/17  Culture, Group A Strep  Result Value Ref Range   Strep A Culture Negative   POCT Influenza A/B  Result Value Ref Range   Influenza A, POC Positive (A) Negative   Influenza B, POC Negative Negative  POCT rapid strep A  Result Value Ref Range   Rapid Strep A Screen Negative Negative     Assessment and Plan: Summer Shields is a 32 y.o. female who is here today for cc of  Chief Complaint  Patient presents with  . Cough    x 2 days/ congestion, pt housewhole is sick  . Sore Throat    x 2 days  .  Sinusitis    x 3 days  . Fatigue    it more lately than usual    Influenza A - Plan: oseltamivir (TAMIFLU) 75 MG capsule, ipratropium (ATROVENT) 0.03 % nasal spray  Sore throat - Plan: POCT Influenza A/B, POCT rapid strep A, Culture, Group A Strep, oseltamivir (TAMIFLU) 75 MG capsule, ipratropium (ATROVENT) 0.03 % nasal spray  Ivar Drape, PA-C [facility] 2/10/20196:52 PM

## 2017-02-09 NOTE — Progress Notes (Deleted)
[facility] [address], [place] [address] [phone]  Date:  02/09/2017   Name:  Summer Shields   DOB:  1985/04/28   MRN:  [id number]  PCP:  Mancel Bale, PA-C    History of Present Illness:  Summer Shields is a 32 y.o. female patient who presents to PCP with  Chief Complaint  Patient presents with  . Cough    x 2 days/ congestion, pt housewhole is sick  . Sore Throat    x 2 days  . Sinusitis    x 3 days  . Fatigue    it more lately than usual       Patient Active Problem List   Diagnosis Date Noted  . Hypersomnia 10/03/2015  . Periodic limb movement 10/03/2015  . Anxiety state 04/08/2015  . Allergic rhinitis due to pollen 04/03/2014  . Major depression, chronic ([place]) 05/10/2013  . Sprain, strain, toes 05/25/2012  . Metatarsalgia of both feet 04/11/2012  . Gait abnormality 04/11/2012  . Plantar fasciitis 04/20/2011    Past Medical History:  Diagnosis Date  . Allergy   . Anxiety   . Depression   . Environmental allergies   . Fibroadenoma   . Headache   . Plantar fasciitis     Past Surgical History:  Procedure Laterality Date  . BUNIONECTOMY Bilateral    04/09/2013 left, 06/30/2013 right,   . bunionectomy revision Right    08/2013  . FOOT SURGERY    . open plantar fasciotomy Right    01/16/2014  . plantar topaz Bilateral    04/09/2013, 06/30/2013  . TONSILLECTOMY    . WISDOM TOOTH EXTRACTION      Social History   Tobacco Use  . Smoking status: Never Smoker  . Smokeless tobacco: Never Used  Substance Use Topics  . Alcohol use: No    Alcohol/week: 0.0 oz  . Drug use: No    Family History  Problem Relation Age of Onset  . Diabetes Maternal Grandfather   . Hypertension Maternal Grandfather   . Stroke Maternal Grandfather   . Hyperlipidemia Maternal Grandfather   . Alcohol abuse Maternal Grandfather   . Depression Father   . Cancer Father   . ADD / ADHD Sister   . Mental illness Brother        anxiety  . Anxiety  disorder Brother   . Alcohol abuse Brother   . Cancer Paternal Grandmother        skin  . Cancer Mother     Allergies  Allergen Reactions  . Lamictal [Lamotrigine] Rash  . Amoxicillin   . Codeine   . Tramadol Nausea And Vomiting    Medication list has been reviewed and updated.  Current Outpatient Medications on File Prior to Visit  Medication Sig Dispense Refill  . fluticasone (FLONASE) 50 MCG/ACT nasal spray SHAKE LIQUID AND USE 2 SPRAYS IN EACH NOSTRIL DAILY 16 g 3  . Norgestimate-Ethinyl Estradiol Triphasic (TRINESSA LO) 0.18/0.215/0.25 MG-25 MCG tab Take 1 tablet by mouth daily. 3 Package 4  . venlafaxine XR (EFFEXOR-XR) 37.5 MG 24 hr capsule Take 3 capsules (112.5 mg total) by mouth daily with breakfast. - needs 112.5mg  for titration purposes 90 capsule 4  . venlafaxine XR (EFFEXOR-XR) 75 MG 24 hr capsule TAKE 1 CAPSULE(75 MG) BY MOUTH DAILY WITH BREAKFAST 30 capsule 0  . benzonatate (TESSALON) 200 MG capsule Take 1 capsule (200 mg total) by mouth 2 (two) times daily as needed for cough. (Patient  not taking: Reported on 02/09/2017) 20 capsule 0   No current facility-administered medications on file prior to visit.     ROS ROS otherwise unremarkable unless listed above.  Physical Examination: BP 118/88   Pulse (!) 119   Temp 99.1 F (37.3 C) (Oral)   Resp 16   Ht 5\' 8"  (1.727 m)   Wt 274 lb (124.3 kg)   LMP 02/09/2017   SpO2 97%   BMI 41.66 kg/m  Ideal Body Weight: Weight in (lb) to have BMI = 25: 164.1  Physical Exam   Assessment and Plan: Summer Shields is a 32 y.o. female who is here today  1. Sore throat *** - POCT Influenza A/B - POCT rapid strep A - Culture, Group A Strep   Summer Drape, PA-C [facility] 02/09/2017 10:57 AM

## 2017-02-09 NOTE — Patient Instructions (Addendum)
Make sure you are hydrating well with water. Take ibuprofen or tylenol for pain or fever.  Influenza, Adult Influenza ("the flu") is an infection in the lungs, nose, and throat (respiratory tract). It is caused by a virus. The flu causes many common cold symptoms, as well as a high fever and body aches. It can make you feel very sick. The flu spreads easily from person to person (is contagious). Getting a flu shot (influenza vaccination) every year is the best way to prevent the flu. Follow these instructions at home:  Take over-the-counter and prescription medicines only as told by your doctor.  Use a cool mist humidifier to add moisture (humidity) to the air in your home. This can make it easier to breathe.  Rest as needed.  Drink enough fluid to keep your pee (urine) clear or pale yellow.  Cover your mouth and nose when you cough or sneeze.  Wash your hands with soap and water often, especially after you cough or sneeze. If you cannot use soap and water, use hand sanitizer.  Stay home from work or school as told by your doctor. Unless you are visiting your doctor, try to avoid leaving home until your fever has been gone for 24 hours without the use of medicine.  Keep all follow-up visits as told by your doctor. This is important. How is this prevented?  Getting a yearly (annual) flu shot is the best way to avoid getting the flu. You may get the flu shot in late summer, fall, or winter. Ask your doctor when you should get your flu shot.  Wash your hands often or use hand sanitizer often.  Avoid contact with people who are sick during cold and flu season.  Eat healthy foods.  Drink plenty of fluids.  Get enough sleep.  Exercise regularly. Contact a doctor if:  You get new symptoms.  You have: ? Chest pain. ? Watery poop (diarrhea). ? A fever.  Your cough gets worse.  You start to have more mucus.  You feel sick to your stomach (nauseous).  You throw up  (vomit). Get help right away if:  You start to be short of breath or have trouble breathing.  Your skin or nails turn a bluish color.  You have very bad pain or stiffness in your neck.  You get a sudden headache.  You get sudden pain in your face or ear.  You cannot stop throwing up. This information is not intended to replace advice given to you by your health care provider. Make sure you discuss any questions you have with your health care provider. Document Released: 10/06/2007 Document Revised: 06/04/2015 Document Reviewed: 10/21/2014 Elsevier Interactive Patient Education  2017 [employer].    IF you received an x-ray today, you will receive an invoice from [facility]. Please contact [facility] at [phone] with questions or concerns regarding your invoice.   IF you received labwork today, you will receive an invoice from [facility]. Please contact [facility] at [phone] with questions or concerns regarding your invoice.   Our billing staff will not be able to assist you with questions regarding bills from these companies.  You will be contacted with the lab results as soon as they are available. The fastest way to get your results is to activate your My Chart account. Instructions are located on the last page of this paperwork. If you have not heard from [place] regarding the results in 2 weeks, please contact this office.

## 2017-02-12 LAB — CULTURE, GROUP A STREP: Strep A Culture: NEGATIVE

## 2017-02-19 ENCOUNTER — Encounter: Payer: Self-pay | Admitting: Physician Assistant

## 2017-03-12 ENCOUNTER — Other Ambulatory Visit: Payer: Self-pay | Admitting: Physician Assistant

## 2017-03-12 DIAGNOSIS — J029 Acute pharyngitis, unspecified: Secondary | ICD-10-CM

## 2017-03-12 DIAGNOSIS — J101 Influenza due to other identified influenza virus with other respiratory manifestations: Secondary | ICD-10-CM

## 2017-04-11 ENCOUNTER — Encounter: Payer: Self-pay | Admitting: Physician Assistant

## 2017-04-26 ENCOUNTER — Emergency Department (HOSPITAL_COMMUNITY): Payer: 59

## 2017-04-26 ENCOUNTER — Emergency Department (HOSPITAL_COMMUNITY)
Admission: EM | Admit: 2017-04-26 | Discharge: 2017-04-26 | Disposition: A | Discharge status: Home / Self Care | Payer: 59 | Attending: Emergency Medicine | Admitting: Emergency Medicine

## 2017-04-26 ENCOUNTER — Encounter (HOSPITAL_COMMUNITY): Payer: Self-pay

## 2017-04-26 DIAGNOSIS — J181 Lobar pneumonia, unspecified organism: Secondary | ICD-10-CM | POA: Insufficient documentation

## 2017-04-26 DIAGNOSIS — Z79899 Other long term (current) drug therapy: Secondary | ICD-10-CM | POA: Insufficient documentation

## 2017-04-26 DIAGNOSIS — J189 Pneumonia, unspecified organism: Secondary | ICD-10-CM

## 2017-04-26 DIAGNOSIS — R05 Cough: Secondary | ICD-10-CM | POA: Diagnosis present

## 2017-04-26 MED ORDER — BENZONATATE 200 MG PO CAPS: 200.0000 mg | ORAL_CAPSULE | Freq: Two times a day (BID) | ORAL | 0 refills | Status: DC | PRN

## 2017-04-26 MED ORDER — ALBUTEROL SULFATE (2.5 MG/3ML) 0.083% IN NEBU
5.0000 mg | INHALATION_SOLUTION | Freq: Once | RESPIRATORY_TRACT | Status: AC
  Administered 2017-04-26: 5 mg via RESPIRATORY_TRACT
  Filled 2017-04-26: qty 6

## 2017-04-26 MED ORDER — AZITHROMYCIN 250 MG PO TABS: 250.0000 mg | ORAL_TABLET | Freq: Every day | ORAL | 0 refills | Status: DC

## 2017-04-26 NOTE — ED Triage Notes (Signed)
Pt complains of a dry cough for about two months and tonight became short of breath, she states she has allergies and takes flonase and claritin daily Pt also complains of chest wall pain and back pain from coughing

## 2017-04-26 NOTE — Discharge Instructions (Addendum)
Take antibiotics as prescribed. Take the entire course, even if your symptoms improve.  Use cough syrup and tessalon pereles as needed for cough.  Make sure you stay well hydrated with water.  Follow up with your primary care doctor if symptoms are not improving.  Return to the ER if you are unable to breath, symptoms are worsening, or you have any new or concerning symptoms.

## 2017-04-26 NOTE — ED Notes (Signed)
Bed: WA03 Expected date:  Expected time:  Means of arrival:  Comments: 

## 2017-04-26 NOTE — ED Notes (Signed)
ED PA at bedside

## 2017-04-26 NOTE — ED Provider Notes (Signed)
[facility] DEPT Provider Note   CSN: [id number] Arrival date & time: 04/26/17  0152     History   Chief Complaint Chief Complaint  Patient presents with  . Cough    HPI Summer Shields is a 32 y.o. female presenting for evaluation of cough.  Patient states that she has been having a persistent cough for the past several months.  It worsened over the past week, and this morning around 2 AM, she had acute onset shortness of breath.  She states that her shortness of breath has improved, but she is still coughing.  It is nonproductive.  She reports generalized chest pain with coughing, currently without pain.  Initially she thought her symptoms were due to allergies, has been taking Flonase and allergy medicine without improvement of symptoms.  She reports subjective fevers.  She has not been taking anything for her cough.  She denies sore throat, nausea, vomiting, abdominal pain.  She denies leg pain or swelling.  No recent travel, surgeries, immobilization, history of cancer, or history of DVT.  She does take OCPs.  HPI  Past Medical History:  Diagnosis Date  . Allergy   . Anxiety   . Depression   . Environmental allergies   . Fibroadenoma   . Headache   . Plantar fasciitis     Patient Active Problem List   Diagnosis Date Noted  . Hypersomnia 10/03/2015  . Periodic limb movement 10/03/2015  . Anxiety state 04/08/2015  . Allergic rhinitis due to pollen 04/03/2014  . Major depression, chronic ([place]) 05/10/2013  . Sprain, strain, toes 05/25/2012  . Metatarsalgia of both feet 04/11/2012  . Gait abnormality 04/11/2012  . Plantar fasciitis 04/20/2011    Past Surgical History:  Procedure Laterality Date  . BUNIONECTOMY Bilateral    04/09/2013 left, 06/30/2013 right,   . bunionectomy revision Right    08/2013  . FOOT SURGERY    . open plantar fasciotomy Right    01/16/2014  . plantar topaz Bilateral    04/09/2013, 06/30/2013  . TONSILLECTOMY      . WISDOM TOOTH EXTRACTION       OB History   None      Home Medications    Prior to Admission medications   Medication Sig Start Date End Date Taking? Authorizing Provider  Armodafinil 250 MG tablet Take 0.5 tablets by mouth daily. 04/12/17  Yes [provider]  cholecalciferol (VITAMIN D) 1000 units tablet Take 2,000 Units by mouth daily.   Yes [provider]  fluticasone (FLONASE) 50 MCG/ACT nasal spray SHAKE LIQUID AND USE 2 SPRAYS IN EACH NOSTRIL DAILY 01/05/17  Yes Rutherford Guys, MD  loratadine (CLARITIN) 10 MG tablet Take 10 mg by mouth daily.   Yes [provider]  MAGNESIUM PO Take 1 tablet by mouth daily.   Yes [provider]  Norgestimate-Ethinyl Estradiol Triphasic (TRINESSA LO) 0.18/0.215/0.25 MG-25 MCG tab Take 1 tablet by mouth daily. 09/08/16  Yes Weber, Sarah L, PA-C  venlafaxine XR (EFFEXOR-XR) 75 MG 24 hr capsule TAKE 1 CAPSULE(75 MG) BY MOUTH DAILY WITH BREAKFAST 01/09/17  Yes Wardell Honour, MD  vitamin B-12 (CYANOCOBALAMIN) 1000 MCG tablet Take 1,000 mcg by mouth daily.   Yes [provider]  azithromycin (ZITHROMAX) 250 MG tablet Take 1 tablet (250 mg total) by mouth daily. Take first 2 tablets together, then 1 every day until finished. 04/26/17   Keleigh Kazee, PA-C  benzonatate (TESSALON) 200 MG capsule Take 1 capsule (200  mg total) by mouth 2 (two) times daily as needed for cough. 04/26/17   Madgeline Rayo, PA-C  Guaifenesin (MUCINEX MAXIMUM STRENGTH) 1200 MG TB12 Take 1 tablet (1,200 mg total) by mouth every 12 (twelve) hours as needed. Patient not taking: Reported on 04/26/2017 02/09/17   Ivar Drape D, PA  ipratropium (ATROVENT) 0.03 % nasal spray USE 2 SPRAYS IN [facility] NOSTRIL TWICE DAILY Patient not taking: Reported on 04/26/2017 03/13/17   Ivar Drape D, PA  oseltamivir (TAMIFLU) 75 MG capsule Take 1 capsule (75 mg total) by mouth 2 (two) times daily. Patient not taking: Reported on 04/26/2017  02/09/17   Ivar Drape D, PA  venlafaxine XR (EFFEXOR-XR) 37.5 MG 24 hr capsule Take 3 capsules (112.5 mg total) by mouth daily with breakfast. - needs 112.5mg  for titration purposes Patient not taking: Reported on 04/26/2017 09/27/16   Mancel Bale, PA-C    Family History Family History  Problem Relation Age of Onset  . Diabetes Maternal Grandfather   . Hypertension Maternal Grandfather   . Stroke Maternal Grandfather   . Hyperlipidemia Maternal Grandfather   . Alcohol abuse Maternal Grandfather   . Depression Father   . Cancer Father   . ADD / ADHD Sister   . Mental illness Brother        anxiety  . Anxiety disorder Brother   . Alcohol abuse Brother   . Cancer Paternal Grandmother        skin  . Cancer Mother     Social History Social History   Tobacco Use  . Smoking status: Never Smoker  . Smokeless tobacco: Never Used  Substance Use Topics  . Alcohol use: No    Alcohol/week: 0.0 oz  . Drug use: No     Allergies   Lamictal [lamotrigine]; Codeine; Tramadol; and Amoxicillin   Review of Systems Review of Systems  Constitutional: Positive for fever (Subjective).  HENT: Negative for sore throat.   Respiratory: Positive for cough and shortness of breath.   Cardiovascular: Negative for leg swelling.  Gastrointestinal: Negative for nausea and vomiting.  Genitourinary: Negative for dysuria.  Musculoskeletal: Negative for back pain.  Skin: Negative for rash.  Allergic/Immunologic: Negative for immunocompromised state.  Neurological: Negative for dizziness.  Hematological: Does not bruise/bleed easily.  Psychiatric/Behavioral: Negative for confusion.     Physical Exam Updated Vital Signs BP 121/69   Pulse (!) 105   Temp 99 F (37.2 C) (Oral)   Resp (!) 22   LMP 03/29/2017   SpO2 96%   Physical Exam  Constitutional: She is oriented to person, place, and time. She appears well-developed and well-nourished. No distress.  HENT:  Head: Normocephalic  and atraumatic.  Eyes: Pupils are equal, round, and reactive to light. EOM are normal.  Neck: Normal range of motion.  Cardiovascular: Regular rhythm and intact distal pulses.  Tachycardic  Pulmonary/Chest: Effort normal. She has no decreased breath sounds. She has rhonchi in the right upper field. She exhibits tenderness.  Rhonchi heard in the right upper lobe.  Patient speaking full sentences without difficulty.  No signs of respiratory distress or accessory muscle use.  Generalized tenderness palpation of chest wall  Abdominal: Soft. She exhibits no distension. There is no tenderness.  Musculoskeletal: Normal range of motion.  Neurological: She is alert and oriented to person, place, and time.  Skin: Skin is warm. No rash noted.  Psychiatric: She has a normal mood and affect.  Nursing note and vitals reviewed.    ED Treatments /  Results  Labs (all labs ordered are listed, but only abnormal results are displayed) Labs Reviewed - No data to display  EKG None  Radiology Dg Chest 2 View  Result Date: 04/26/2017 CLINICAL DATA:  Initial evaluation for acute cough. EXAM: CHEST - 2 VIEW COMPARISON:  None. FINDINGS: Cardiac and mediastinal silhouettes are within normal limits. Lungs normally inflated. Hazy infiltrate within the right upper lobe, consistent with pneumonia. Possible additional patchy and hazy right lower lobe opacities noted as well. Left lung is clear. No pulmonary edema or pleural effusion. No pneumothorax. No acute osseous abnormality. IMPRESSION: Hazy right upper lobe infiltrate, consistent with pneumonia. Possible patchy right lower lobe involvement as well. Electronically Signed   By: Jeannine Boga M.D.   On: 04/26/2017 04:29    Procedures Procedures (including critical care time)  Medications Ordered in ED Medications  albuterol (PROVENTIL) (2.5 MG/3ML) 0.083% nebulizer solution 5 mg (5 mg Nebulization Given 04/26/17 0408)     Initial Impression /  Assessment and Plan / ED Course  I have reviewed the triage vital signs and the nursing notes.  Pertinent labs & imaging results that were available during my care of the patient were reviewed by me and considered in my medical decision making (see chart for details).     Patient presenting for evaluation of cough.  Physical exam shows patient who is tachycardic, although this improved throughout the visit without intervention.  She is afebrile and appears nontoxic.  Pulmonary exam with rhonchi.  No signs of respiratory distress.  X-ray shows right upper lobe pneumonia.  Doubt PE.  Discussed findings with patient.  Discussed treatment with antibiotics and antitussives.  Follow-up with PCP as needed.  Strict return precautions given.  At this time, patient appears safe for discharge.  Patient states she understands and agrees to plan.  Final Clinical Impressions(s) / ED Diagnoses   Final diagnoses:  Pneumonia of right upper lobe due to infectious organism [facility])    ED Discharge Orders        Ordered    benzonatate (TESSALON) 200 MG capsule  2 times daily PRN     04/26/17 0718    azithromycin (ZITHROMAX) 250 MG tablet  Daily     04/26/17 0718       Franchot Heidelberg, PA-C 04/26/17 0856    Molpus, Jenny Reichmann, MD 04/26/17 2230

## 2017-04-28 ENCOUNTER — Emergency Department (HOSPITAL_COMMUNITY): Payer: 59

## 2017-04-28 ENCOUNTER — Emergency Department (HOSPITAL_COMMUNITY)
Admission: EM | Admit: 2017-04-28 | Discharge: 2017-04-28 | Disposition: A | Discharge status: Home / Self Care | Payer: 59 | Attending: Emergency Medicine | Admitting: Emergency Medicine

## 2017-04-28 ENCOUNTER — Other Ambulatory Visit: Payer: Self-pay

## 2017-04-28 ENCOUNTER — Encounter: Payer: Self-pay | Admitting: Physician Assistant

## 2017-04-28 ENCOUNTER — Ambulatory Visit (INDEPENDENT_AMBULATORY_CARE_PROVIDER_SITE_OTHER): Payer: 59 | Admitting: Physician Assistant

## 2017-04-28 ENCOUNTER — Encounter (HOSPITAL_COMMUNITY): Payer: Self-pay

## 2017-04-28 VITALS — BP 132/80 | HR 127 | Temp 98.2°F | Resp 17 | Ht 68.0 in | Wt 269.0 lb

## 2017-04-28 DIAGNOSIS — R0602 Shortness of breath: Secondary | ICD-10-CM

## 2017-04-28 DIAGNOSIS — J181 Lobar pneumonia, unspecified organism: Secondary | ICD-10-CM

## 2017-04-28 DIAGNOSIS — Z79899 Other long term (current) drug therapy: Secondary | ICD-10-CM | POA: Diagnosis not present

## 2017-04-28 DIAGNOSIS — R05 Cough: Secondary | ICD-10-CM | POA: Insufficient documentation

## 2017-04-28 DIAGNOSIS — J189 Pneumonia, unspecified organism: Secondary | ICD-10-CM | POA: Diagnosis not present

## 2017-04-28 LAB — CBC WITH DIFFERENTIAL/PLATELET
BASOS ABS: 0 10*3/uL (ref 0.0–0.1)
Basophils Relative: 0 %
Eosinophils Absolute: 0.3 10*3/uL (ref 0.0–0.7)
Eosinophils Relative: 4 %
HEMATOCRIT: 43.1 % (ref 36.0–46.0)
Hemoglobin: 14.2 g/dL (ref 12.0–15.0)
LYMPHS PCT: 34 %
Lymphs Abs: 3.1 10*3/uL (ref 0.7–4.0)
MCH: 27.9 pg (ref 26.0–34.0)
MCHC: 32.9 g/dL (ref 30.0–36.0)
MCV: 84.7 fL (ref 78.0–100.0)
Monocytes Absolute: 0.6 10*3/uL (ref 0.1–1.0)
Monocytes Relative: 6 %
NEUTROS ABS: 5.1 10*3/uL (ref 1.7–7.7)
Neutrophils Relative %: 56 %
Platelets: 234 10*3/uL (ref 150–400)
RBC: 5.09 MIL/uL (ref 3.87–5.11)
RDW: 13.9 % (ref 11.5–15.5)
WBC: 9.1 10*3/uL (ref 4.0–10.5)

## 2017-04-28 LAB — COMPREHENSIVE METABOLIC PANEL
ALT: 24 U/L (ref 14–54)
AST: 27 U/L (ref 15–41)
Albumin: 4 g/dL (ref 3.5–5.0)
Alkaline Phosphatase: 61 U/L (ref 38–126)
Anion gap: 10 (ref 5–15)
BILIRUBIN TOTAL: 0.7 mg/dL (ref 0.3–1.2)
BUN: 9 mg/dL (ref 6–20)
CHLORIDE: 106 mmol/L (ref 101–111)
CO2: 22 mmol/L (ref 22–32)
CREATININE: 0.78 mg/dL (ref 0.44–1.00)
Calcium: 8.9 mg/dL (ref 8.9–10.3)
GFR calc Af Amer: >60 mL/min (ref >60)
GLUCOSE: 95 mg/dL (ref 65–99)
Potassium: 4.1 mmol/L (ref 3.5–5.1)
Sodium: 138 mmol/L (ref 135–145)
TOTAL PROTEIN: 7.5 g/dL (ref 6.5–8.1)

## 2017-04-28 LAB — I-STAT CG4 LACTIC ACID, ED: LACTIC ACID, VENOUS: 0.89 mmol/L (ref 0.5–1.9)

## 2017-04-28 LAB — I-STAT BETA HCG BLOOD, ED (MC, WL, AP ONLY): I-stat hCG, quantitative: <5 m[IU]/mL (ref <5)

## 2017-04-28 LAB — I-STAT TROPONIN, ED: TROPONIN I, POC: 0 ng/mL (ref 0.00–0.08)

## 2017-04-28 MED ORDER — IOPAMIDOL (ISOVUE-370) INJECTION 76%
100.0000 mL | Freq: Once | INTRAVENOUS | Status: AC | PRN
  Administered 2017-04-28: 100 mL via INTRAVENOUS

## 2017-04-28 MED ORDER — ALBUTEROL SULFATE (2.5 MG/3ML) 0.083% IN NEBU: 5.0000 mg | INHALATION_SOLUTION | Freq: Once | RESPIRATORY_TRACT | Status: DC

## 2017-04-28 MED ORDER — ONDANSETRON HCL 4 MG/2ML IJ SOLN
4.0000 mg | Freq: Once | INTRAMUSCULAR | Status: AC
  Administered 2017-04-28: 4 mg via INTRAVENOUS
  Filled 2017-04-28: qty 2

## 2017-04-28 MED ORDER — ALBUTEROL SULFATE (2.5 MG/3ML) 0.083% IN NEBU
5.0000 mg | INHALATION_SOLUTION | Freq: Once | RESPIRATORY_TRACT | Status: AC
  Administered 2017-04-28: 5 mg via RESPIRATORY_TRACT
  Filled 2017-04-28: qty 6

## 2017-04-28 MED ORDER — LEVOFLOXACIN IN D5W 750 MG/150ML IV SOLN
750.0000 mg | Freq: Once | INTRAVENOUS | Status: AC
  Administered 2017-04-28: 750 mg via INTRAVENOUS
  Filled 2017-04-28: qty 150

## 2017-04-28 MED ORDER — ALBUTEROL SULFATE HFA 108 (90 BASE) MCG/ACT IN AERS
2.0000 | INHALATION_SPRAY | Freq: Once | RESPIRATORY_TRACT | Status: AC
  Administered 2017-04-28: 2 via RESPIRATORY_TRACT
  Filled 2017-04-28: qty 6.7

## 2017-04-28 MED ORDER — LEVOFLOXACIN 750 MG PO TABS: 750.0000 mg | ORAL_TABLET | Freq: Every day | ORAL | 0 refills | Status: DC

## 2017-04-28 MED ORDER — IPRATROPIUM BROMIDE 0.02 % IN SOLN
0.5000 mg | Freq: Once | RESPIRATORY_TRACT | Status: AC
  Administered 2017-04-28: 0.5 mg via RESPIRATORY_TRACT
  Filled 2017-04-28: qty 2.5

## 2017-04-28 MED ORDER — METHYLPREDNISOLONE SODIUM SUCC 125 MG IJ SOLR
125.0000 mg | Freq: Once | INTRAMUSCULAR | Status: AC
  Administered 2017-04-28: 125 mg via INTRAVENOUS
  Filled 2017-04-28: qty 2

## 2017-04-28 MED ORDER — IOPAMIDOL (ISOVUE-370) INJECTION 76%
INTRAVENOUS | Status: AC
  Filled 2017-04-28: qty 100

## 2017-04-28 MED ORDER — SODIUM CHLORIDE 0.9 % IV BOLUS
1000.0000 mL | Freq: Once | INTRAVENOUS | Status: AC
  Administered 2017-04-28: 1000 mL via INTRAVENOUS

## 2017-04-28 NOTE — ED Provider Notes (Signed)
[facility] DEPT Provider Note   CSN: [id number] Arrival date & time: 04/28/17  1334     History   Chief Complaint Chief Complaint  Patient presents with  . Cough  . Shortness of Breath    HPI Summer Shields is a 32 y.o. female history of headaches, depression here presenting with persistent shortness of breath, cough.  She was seen here several days ago and had a chest x-ray that showed right upper lobe pneumonia.  Was started on Z-Pak and has been compliant with it.  Patient states that she has persistent shortness of breath and nonproductive cough.  Also low-grade temperature at home.  Patient went to pulmonary urgent care today and was sent to the ED for CTA to r/o PE. She is on OCP. She denies any recent travel or hx of blood clots.   The history is provided by the patient.    Past Medical History:  Diagnosis Date  . Allergy   . Anxiety   . Depression   . Environmental allergies   . Fibroadenoma   . Headache   . Plantar fasciitis     Patient Active Problem List   Diagnosis Date Noted  . Hypersomnia 10/03/2015  . Periodic limb movement 10/03/2015  . Anxiety state 04/08/2015  . Allergic rhinitis due to pollen 04/03/2014  . Major depression, chronic ([place]) 05/10/2013  . Sprain, strain, toes 05/25/2012  . Metatarsalgia of both feet 04/11/2012  . Gait abnormality 04/11/2012  . Plantar fasciitis 04/20/2011    Past Surgical History:  Procedure Laterality Date  . BUNIONECTOMY Bilateral    04/09/2013 left, 06/30/2013 right,   . bunionectomy revision Right    08/2013  . FOOT SURGERY    . open plantar fasciotomy Right    01/16/2014  . plantar topaz Bilateral    04/09/2013, 06/30/2013  . TONSILLECTOMY    . WISDOM TOOTH EXTRACTION       OB History   None      Home Medications    Prior to Admission medications   Medication Sig Start Date End Date Taking? Authorizing Provider  Armodafinil 250 MG tablet Take 0.5 tablets by mouth  daily. 04/12/17   [provider]  azithromycin (ZITHROMAX) 250 MG tablet Take 1 tablet (250 mg total) by mouth daily. Take first 2 tablets together, then 1 every day until finished. 04/26/17   Caccavale, Sophia, PA-C  benzonatate (TESSALON) 200 MG capsule Take 1 capsule (200 mg total) by mouth 2 (two) times daily as needed for cough. 04/26/17   Caccavale, Sophia, PA-C  cholecalciferol (VITAMIN D) 1000 units tablet Take 2,000 Units by mouth daily.    [provider]  fluticasone (FLONASE) 50 MCG/ACT nasal spray SHAKE LIQUID AND USE 2 SPRAYS IN EACH NOSTRIL DAILY 01/05/17   Rutherford Guys, MD  Guaifenesin [facility] MAXIMUM STRENGTH) 1200 MG TB12 Take 1 tablet (1,200 mg total) by mouth every 12 (twelve) hours as needed. 02/09/17   Ivar Drape D, PA  loratadine (CLARITIN) 10 MG tablet Take 10 mg by mouth daily.    [provider]  MAGNESIUM PO Take 1 tablet by mouth daily.    [provider]  Norgestimate-Ethinyl Estradiol Triphasic (TRINESSA LO) 0.18/0.215/0.25 MG-25 MCG tab Take 1 tablet by mouth daily. 09/08/16   Gale Journey, Damaris Hippo, PA-C  venlafaxine XR (EFFEXOR-XR) 37.5 MG 24 hr capsule Take 3 capsules (112.5 mg total) by mouth daily with breakfast. - needs 112.5mg  for titration purposes 09/27/16   [name]  L, PA-C  venlafaxine XR (EFFEXOR-XR) 75 MG 24 hr capsule TAKE 1 CAPSULE(75 MG) BY MOUTH DAILY WITH BREAKFAST 01/09/17   Wardell Honour, MD  vitamin B-12 (CYANOCOBALAMIN) 1000 MCG tablet Take 1,000 mcg by mouth daily.    [provider]    Family History Family History  Problem Relation Age of Onset  . Diabetes Maternal Grandfather   . Hypertension Maternal Grandfather   . Stroke Maternal Grandfather   . Hyperlipidemia Maternal Grandfather   . Alcohol abuse Maternal Grandfather   . Depression Father   . Cancer Father   . ADD / ADHD Sister   . Mental illness Brother        anxiety  . Anxiety disorder Brother   . Alcohol abuse Brother   .  Cancer Paternal Grandmother        skin  . Cancer Mother     Social History Social History   Tobacco Use  . Smoking status: Never Smoker  . Smokeless tobacco: Never Used  Substance Use Topics  . Alcohol use: No    Alcohol/week: 0.0 oz  . Drug use: No     Allergies   Lamictal [lamotrigine]; Codeine; Tramadol; and Amoxicillin   Review of Systems Review of Systems  Respiratory: Positive for cough and shortness of breath.   All other systems reviewed and are negative.    Physical Exam Updated Vital Signs BP 108/71 (BP Location: Left Arm)   Pulse 98   Temp 98.5 F (36.9 C) (Oral)   Resp 18   LMP 03/29/2017   SpO2 99%   Physical Exam  Constitutional:  Slightly tachypneic   HENT:  Head: Normocephalic.  Mouth/Throat: Oropharynx is clear and moist.  Eyes: Pupils are equal, round, and reactive to light. EOM are normal.  Neck: Normal range of motion. Neck supple.  Cardiovascular: Regular rhythm and normal heart sounds.  Slightly tachycardic   Pulmonary/Chest:  Slightly tachypneic, mild wheezing R Upper lobe, no obvious retractions   Abdominal: Soft. Bowel sounds are normal.  Musculoskeletal: Normal range of motion.       Right lower leg: Normal. She exhibits no tenderness and no edema.       Left lower leg: Normal. She exhibits no tenderness and no edema.  Neurological: She is alert.  Skin: Skin is warm. Capillary refill takes less than 2 seconds.  Psychiatric: She has a normal mood and affect.  Nursing note and vitals reviewed.    ED Treatments / Results  Labs (all labs ordered are listed, but only abnormal results are displayed) Labs Reviewed  CBC WITH DIFFERENTIAL/PLATELET  COMPREHENSIVE METABOLIC PANEL  I-STAT TROPONIN, ED  I-STAT BETA HCG BLOOD, ED ([facility], [facility], [facility] ONLY)  I-STAT CG4 LACTIC ACID, ED    EKG EKG Interpretation  Date/Time:  Friday April 28 2017 14:22:04 EDT Ventricular Rate:  99 PR Interval:    QRS Duration: 94 QT Interval:  344 QTC  Calculation: 442 R Axis:   29 Text Interpretation:  Sinus rhythm Low voltage, precordial leads No previous ECGs available Confirmed by Wandra Arthurs [phone]) on 04/28/2017 6:36:29 PM   Radiology Dg Chest 2 View  Result Date: 04/28/2017 CLINICAL DATA:  Cough for 2 months.  Shortness of breath. EXAM: CHEST - 2 VIEW COMPARISON:  04/26/2017 FINDINGS: The cardiomediastinal silhouette is within normal limits. Patchy right upper lobe airspace opacity on the prior study has mildly improved. The left lung remains clear. No pleural effusion or pneumothorax is identified. No acute osseous abnormality is  seen. IMPRESSION: Mildly decreased right upper lobe opacity which may reflect improving pneumonia. Electronically Signed   By: Logan Bores M.D.   On: 04/28/2017 14:27   Ct Angio Chest Pe W And/or Wo Contrast  Result Date: 04/28/2017 CLINICAL DATA:  Dyspnea and cough EXAM: CT ANGIOGRAPHY CHEST WITH CONTRAST TECHNIQUE: Multidetector CT imaging of the chest was performed using the standard protocol during bolus administration of intravenous contrast. Multiplanar CT image reconstructions and MIPs were obtained to evaluate the vascular anatomy. CONTRAST:  132mL ISOVUE-370 IOPAMIDOL (ISOVUE-370) INJECTION 76% COMPARISON:  CXR exams dating back through 04/26/2017 FINDINGS: Cardiovascular: Satisfactory opacification of the pulmonary arteries to the segmental level. No evidence of pulmonary embolism. Normal heart size. No pericardial effusion. Mediastinum/Nodes: Mild reactive right lower paratracheal and hilar lymph nodes measuring up to 9 mm. Patent trachea and mainstem bronchi. Unremarkable esophagus thyroid. Lungs/Pleura: Pulmonary consolidations in the right upper lobe and to a lesser degree the right middle and both lower lobes are identified consistent with multilobar pneumonia. No effusion or pneumothorax. Upper Abdomen: No acute abnormality. Musculoskeletal: No chest wall abnormality. No acute or significant osseous  findings. Review of the MIP images confirms the above findings. IMPRESSION: 1. Multilobar pneumonia, greatest in the right upper lobe. 2. No acute pulmonary embolus. Electronically Signed   By: Ashley Royalty M.D.   On: 04/28/2017 20:44    Procedures Procedures (including critical care time)  Medications Ordered in ED Medications  albuterol (PROVENTIL) (2.5 MG/3ML) 0.083% nebulizer solution 5 mg (5 mg Nebulization Not Given 04/28/17 1741)  iopamidol (ISOVUE-370) 76 % injection (has no administration in time range)  levofloxacin (LEVAQUIN) IVPB 750 mg (750 mg Intravenous New Bag/Given 04/28/17 2108)  sodium chloride 0.9 % bolus 1,000 mL (0 mLs Intravenous Stopped 04/28/17 2014)  methylPREDNISolone sodium succinate (SOLU-MEDROL) 125 mg/2 mL injection 125 mg (125 mg Intravenous Given 04/28/17 1919)  albuterol (PROVENTIL) (2.5 MG/3ML) 0.083% nebulizer solution 5 mg (5 mg Nebulization Given 04/28/17 1859)  ipratropium (ATROVENT) nebulizer solution 0.5 mg (0.5 mg Nebulization Given 04/28/17 1859)  iopamidol (ISOVUE-370) 76 % injection 100 mL (100 mLs Intravenous Contrast Given 04/28/17 2016)  albuterol (PROVENTIL) (2.5 MG/3ML) 0.083% nebulizer solution 5 mg (5 mg Nebulization Given 04/28/17 2117)  albuterol (PROVENTIL HFA;VENTOLIN HFA) 108 (90 Base) MCG/ACT inhaler 2 puff (2 puffs Inhalation Given 04/28/17 2209)  ondansetron (ZOFRAN) injection 4 mg (4 mg Intravenous Given 04/28/17 2208)     Initial Impression / Assessment and Plan / ED Course  I have reviewed the triage vital signs and the nursing notes.  Pertinent labs & imaging results that were available during my care of the patient were reviewed by me and considered in my medical decision making (see chart for details).     RAENETTE SAKATA is a 32 y.o. female here with SOB, cough. Recent CXR showed RUL pneumonia but patient didn't improve on zpack. Patient is afebrile but is borderline tachycardic. She is on OCP so I think PE is possible. Will get  CTA to r/o PE. Will get labs. Will give nebs, steroids as bronchitis or asthma is possible as well.   10:31 PM WBC nl. Lactate nl. CTA showed no PE, there is persistent pneumonia. Currently, she doesn't septic and doesn't require oxygen. Given IV levaquin in the ED. Will switch to levaquin from zpack. Will give albuterol as needed. No wheezing after nebs now.   Final Clinical Impressions(s) / ED Diagnoses   Final diagnoses:  None    ED Discharge Orders  None       Drenda Freeze, MD 04/28/17 2231

## 2017-04-28 NOTE — ED Notes (Signed)
Patient transported to X-ray 

## 2017-04-28 NOTE — Progress Notes (Signed)
   Wt Readings from Last 3 Encounters:  04/28/17 269 lb (122 kg)  02/09/17 274 lb (124.3 kg)  01/05/17 279 lb (126.6 kg)   Temp Readings from Last 3 Encounters:  04/28/17 98.2 F (36.8 C) (Oral)  04/26/17 99 F (37.2 C) (Oral)  02/09/17 99.1 F (37.3 C) (Oral)   BP Readings from Last 3 Encounters:  04/28/17 132/80  04/26/17 121/69  02/09/17 118/88   Pulse Readings from Last 3 Encounters:  04/28/17 (!) 127  04/26/17 (!) 105  02/09/17 (!) 119

## 2017-04-28 NOTE — Progress Notes (Signed)
04/28/2017 1:11 PM   DOB: 12/18/1985 / MRN: [id number]  SUBJECTIVE:  Summer Shields is a 32 y.o. female presenting for for chest pain, shortness of breath.  Patient was diagnosed with pneumonia at the ED 2 days ago but told to return if her symptoms did not significantly improve as a pulmonary embolism remained on the diagnosis.  She does take OCPs.  She denies leg swelling and posterior calf pain.  She was placed on azithromycin.  She is allergic to lamictal [lamotrigine]; codeine; tramadol; and amoxicillin.   She  has a past medical history of Allergy, Anxiety, Depression, Environmental allergies, Fibroadenoma, Headache, and Plantar fasciitis.    She  reports that she has never smoked. She has never used smokeless tobacco. She reports that she does not drink alcohol or use drugs. She  reports that she currently engages in sexual activity. She reports using the following method of birth control/protection: Pill. The patient  has a past surgical history that includes Wisdom tooth extraction; Foot surgery; Bunionectomy (Bilateral); plantar topaz (Bilateral); open plantar fasciotomy (Right); bunionectomy revision (Right); and Tonsillectomy.  Her family history includes ADD / ADHD in her sister; Alcohol abuse in her brother and maternal grandfather; Anxiety disorder in her brother; Cancer in her father, mother, and paternal grandmother; Depression in her father; Diabetes in her maternal grandfather; Hyperlipidemia in her maternal grandfather; Hypertension in her maternal grandfather; Mental illness in her brother; Stroke in her maternal grandfather.  Review of Systems  Constitutional: Negative for chills, diaphoresis and fever.  Eyes: Negative.   Respiratory: Positive for cough. Negative for hemoptysis, shortness of breath and wheezing.   Cardiovascular: Negative for chest pain and leg swelling.  Gastrointestinal: Negative for nausea.  Skin: Negative for rash.  Neurological: Negative for  dizziness, sensory change, speech change, focal weakness and headaches.    The problem list and medications were reviewed and updated by myself where necessary and exist elsewhere in the encounter.   OBJECTIVE:  BP 132/80   Pulse (!) 127   Temp 98.2 F (36.8 C) (Oral)   Resp 17   Ht 5\' 8"  (1.727 m)   Wt 269 lb (122 kg)   LMP 03/29/2017   SpO2 98%   BMI 40.90 kg/m   Physical Exam  Constitutional: She is oriented to person, place, and time.  Non-toxic appearance. She appears ill. No distress.  Pulmonary/Chest: No stridor. Tachypnea noted. No respiratory distress.  Adventitious breath sounds throughout.  Musculoskeletal:       Right lower leg: Normal.       Left lower leg: Normal.  Neurological: She is alert and oriented to person, place, and time.  Skin: Skin is warm. Capillary refill takes less than 2 seconds.    No results found for this or any previous visit (from the past 72 hour(s)).  No results found.  ASSESSMENT AND PLAN:  Summer Shields was seen today for shortness of breath and depression.  Diagnoses and all orders for this visit:  Shortness of breath: Patient taking azithromycin x2 days now and is short of breath in the clinic.  Denies leg pain leg swelling.  Does take OCPs.  She was advised by the ED staff to return if her symptoms were not improving as they may represent a pulmonary embolism.  There is no way that I can rule that out here today.  I have advised that she go back to the emergency department for further care.  The patient agrees to go back to the  emergency department.    The patient is advised to call or return to clinic if she does not see an improvement in symptoms, or to seek the care of the closest emergency department if she worsens with the above plan.   Philis Fendt, MHS, PA-C [facility] 04/28/2017 1:11 PM

## 2017-04-28 NOTE — Patient Instructions (Signed)
Go straight to the emergency department

## 2017-04-28 NOTE — Discharge Instructions (Signed)
Take levaquin daily for a week.   Stay hydrated.   Use albuterol every 4 hrs as needed for cough.   See your doctor next week   Return to ER if you have worse shortness of breath, cough, wheezing, fever.

## 2017-04-28 NOTE — ED Triage Notes (Signed)
Pt reports that she was seen here 2 days ago for SOB and cough. She went to her PCP today and he stated that she should have seen more improvement by now and she needs a CT scan. A&Ox4. Ambulatory.

## 2017-05-11 IMAGING — US US ABDOMEN LIMITED
1 series · 14 of 25 positions shown · non-contrast
Comparison: None.

CLINICAL DATA: Elevated liver function tests, some right upper
quadrant pain

EXAM:
US ABDOMEN LIMITED - RIGHT UPPER QUADRANT

[Series 1: us abdomen limited · 0.33mm/px · 14 of 49 slices shown]
[im 1/49]
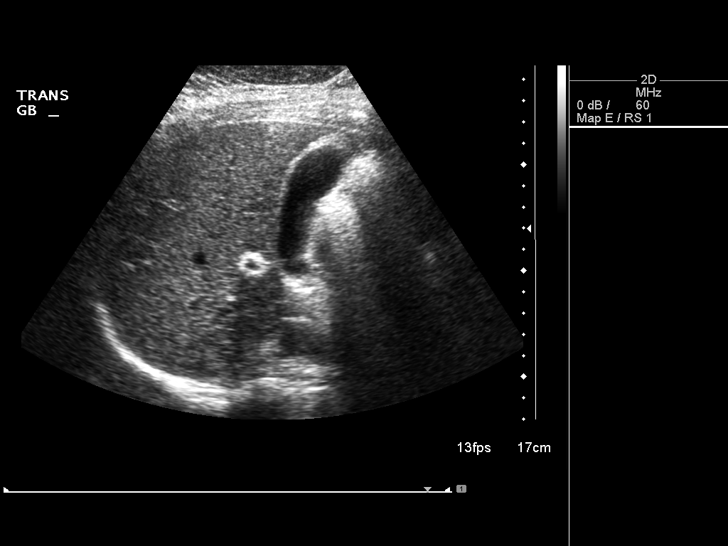
[im 5/49]
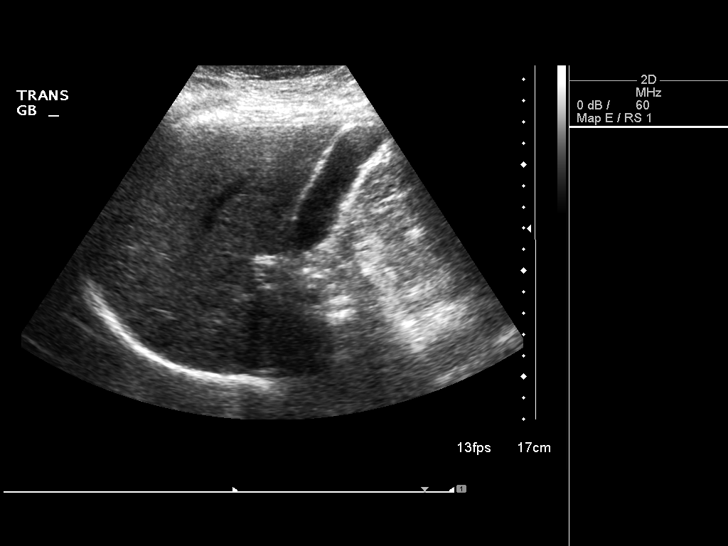
[im 9/49]
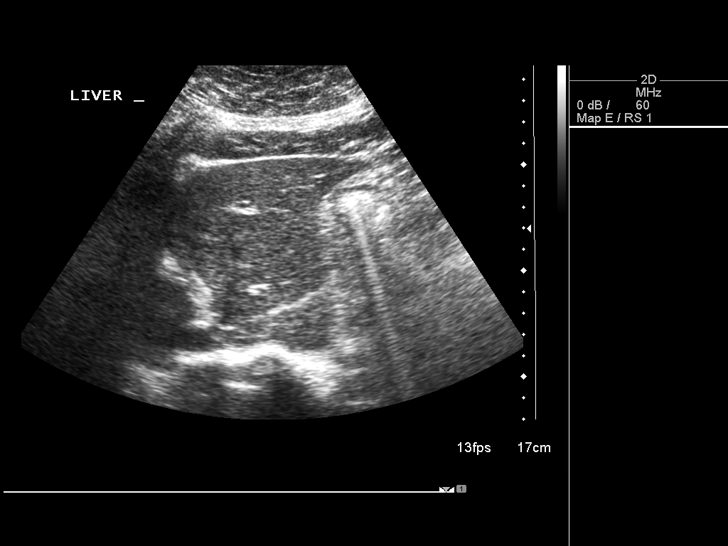
[im 13/49]
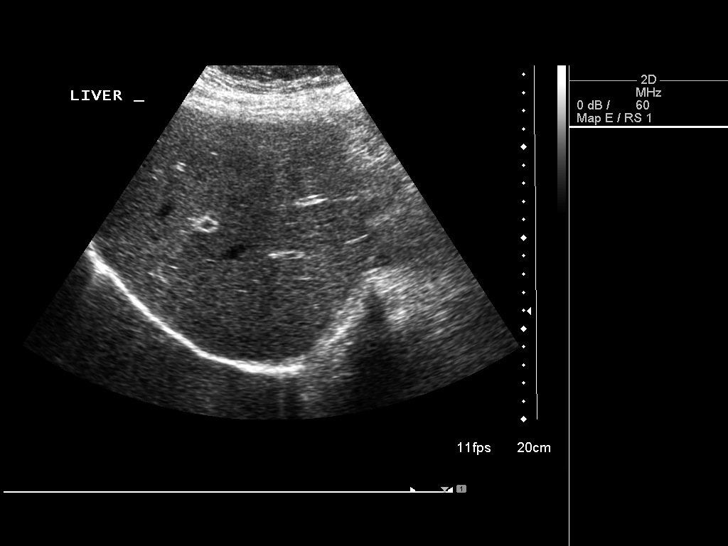
[im 17/49]
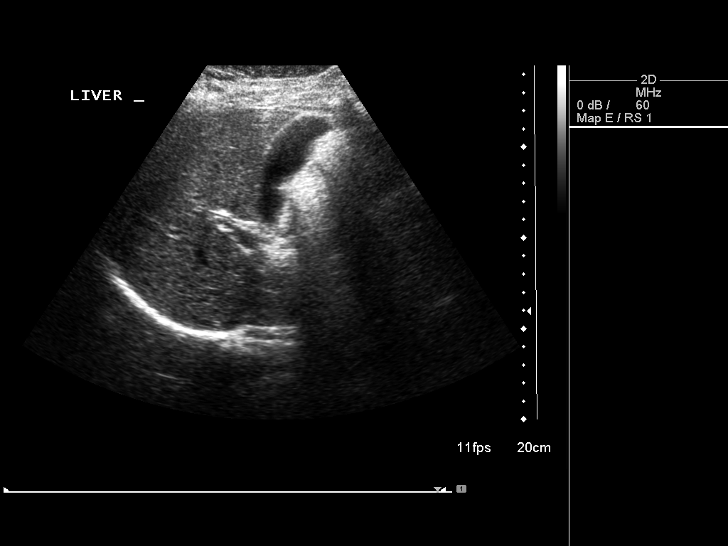
[im 19/49]
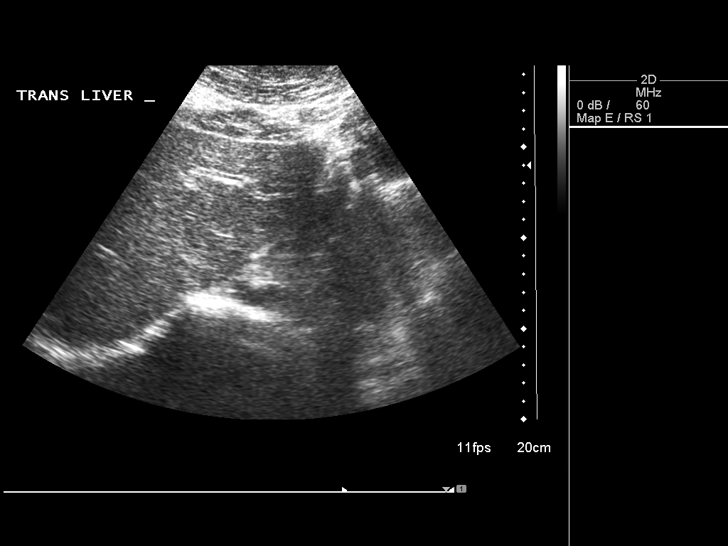
[im 23/49]
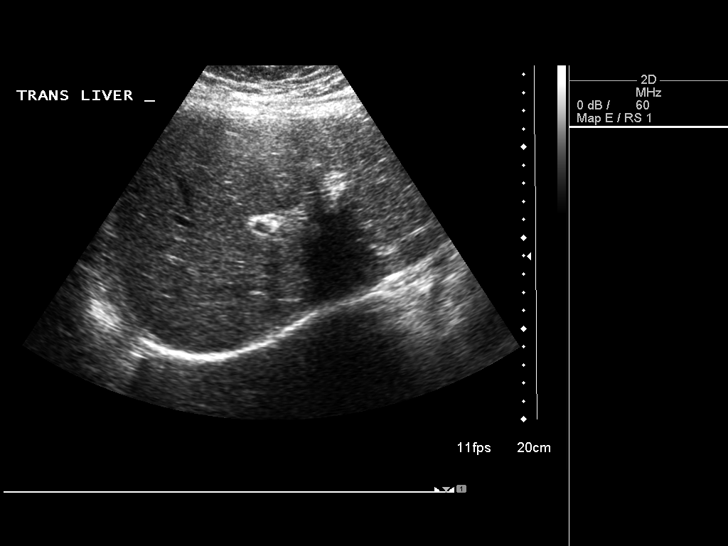
[im 27/49]
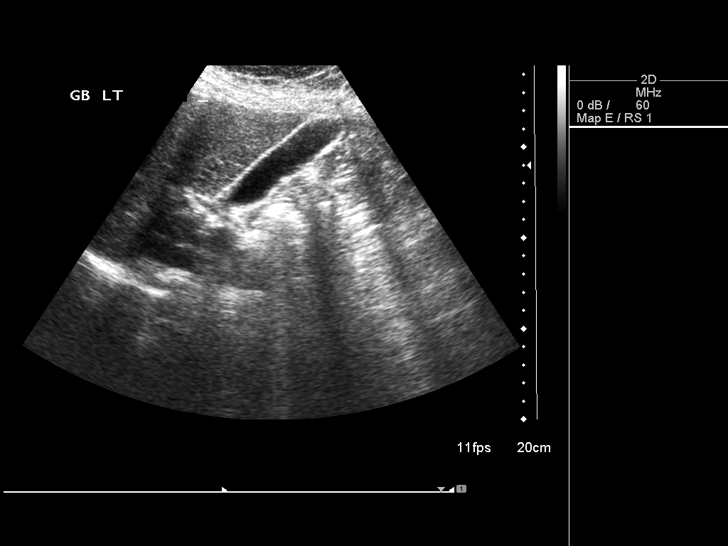
[im 31/49]
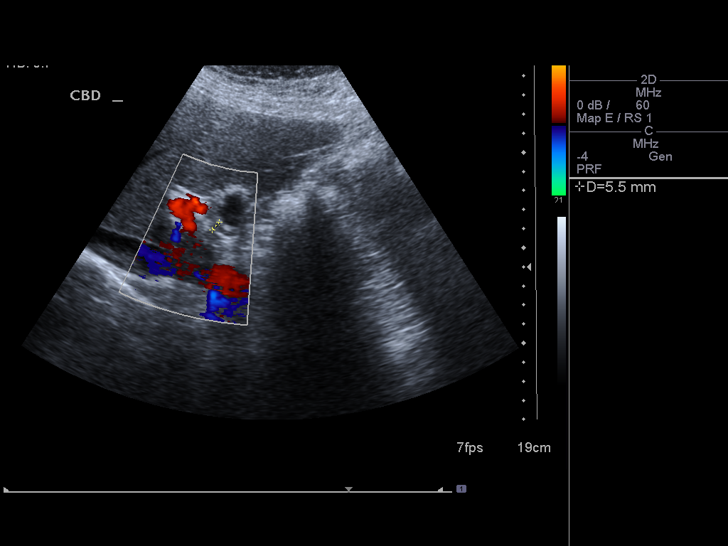
[im 33/49]
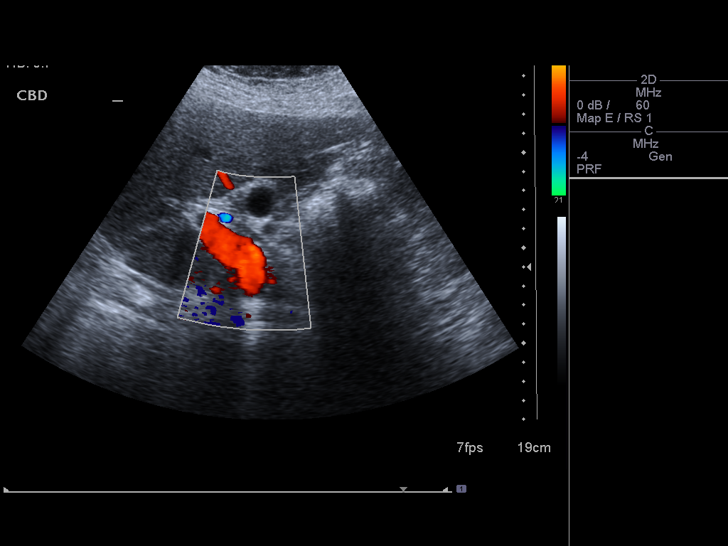
[im 37/49]
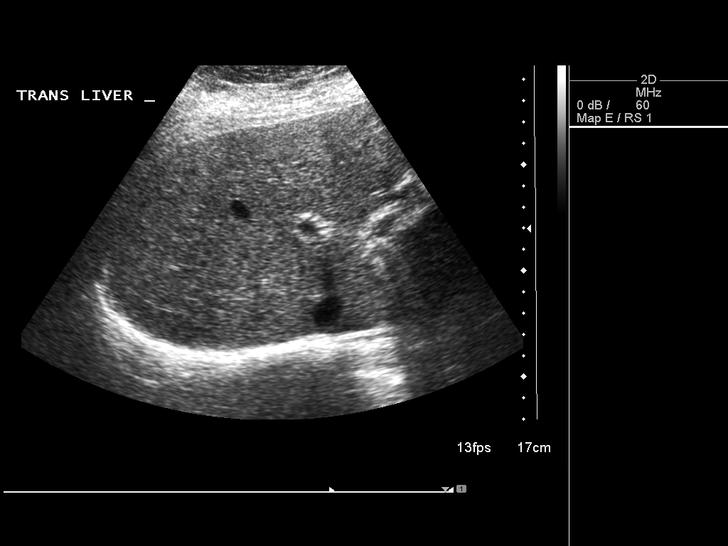
[im 41/49]
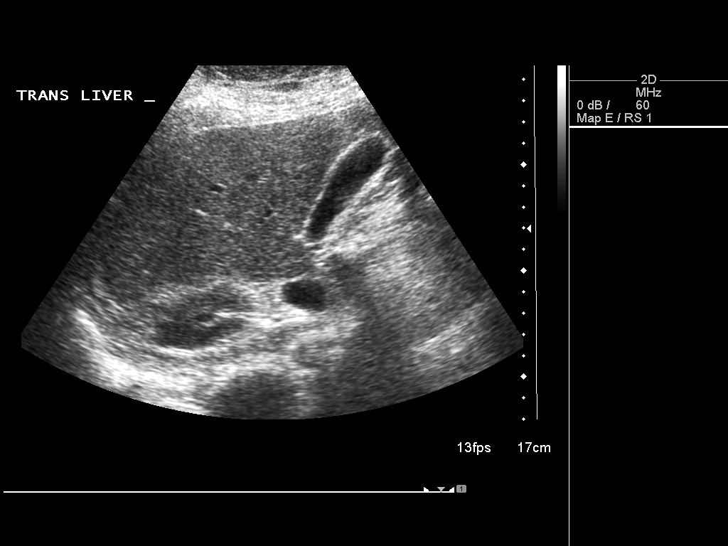
[im 45/49]
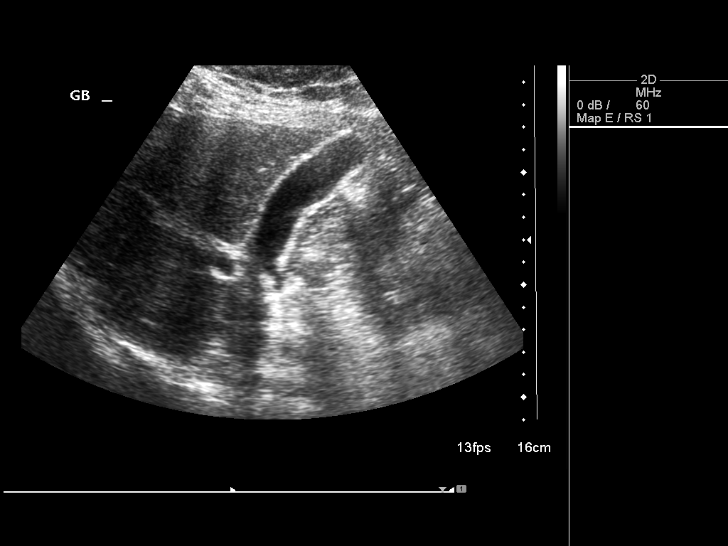
[im 49/49]
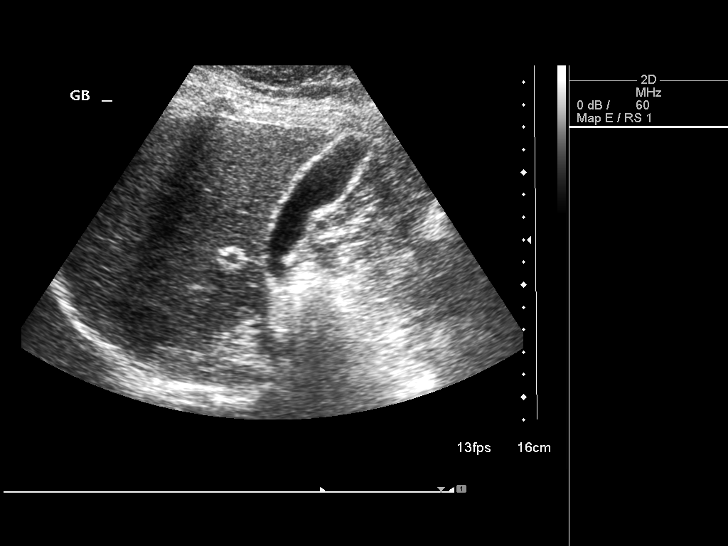

[14 of 25 positions shown; findings below may reference images not displayed]

FINDINGS: Gallbladder:

The gallbladder is well visualized and no gallstones are noted. The
gallbladder wall is minimally prominent. There is no pain over the
gallbladder with compression.

Common bile duct:

Diameter: The common bile duct is normal measuring 5.5 mm.

Liver:

The liver has a normal echogenic pattern. No focal hepatic
abnormality is seen.
IMPRESSION: Negative limited ultrasound of the right upper quadrant.

## 2017-05-12 ENCOUNTER — Ambulatory Visit: Payer: 59 | Admitting: Physician Assistant

## 2017-05-22 ENCOUNTER — Other Ambulatory Visit: Payer: Self-pay

## 2017-05-22 ENCOUNTER — Encounter: Payer: Self-pay | Admitting: Physician Assistant

## 2017-05-22 ENCOUNTER — Ambulatory Visit (INDEPENDENT_AMBULATORY_CARE_PROVIDER_SITE_OTHER): Payer: 59 | Admitting: Physician Assistant

## 2017-05-22 VITALS — BP 124/70 | HR 121 | Temp 98.9°F | Resp 18 | Ht 68.0 in | Wt 269.6 lb

## 2017-05-22 DIAGNOSIS — Z01419 Encounter for gynecological examination (general) (routine) without abnormal findings: Secondary | ICD-10-CM | POA: Diagnosis not present

## 2017-05-22 DIAGNOSIS — Z131 Encounter for screening for diabetes mellitus: Secondary | ICD-10-CM | POA: Diagnosis not present

## 2017-05-22 DIAGNOSIS — Z23 Encounter for immunization: Secondary | ICD-10-CM | POA: Diagnosis not present

## 2017-05-22 DIAGNOSIS — Z0000 Encounter for general adult medical examination without abnormal findings: Secondary | ICD-10-CM

## 2017-05-22 DIAGNOSIS — R5383 Other fatigue: Secondary | ICD-10-CM

## 2017-05-22 DIAGNOSIS — Z13220 Encounter for screening for lipoid disorders: Secondary | ICD-10-CM | POA: Diagnosis not present

## 2017-05-22 DIAGNOSIS — E559 Vitamin D deficiency, unspecified: Secondary | ICD-10-CM | POA: Diagnosis not present

## 2017-05-22 DIAGNOSIS — M7989 Other specified soft tissue disorders: Secondary | ICD-10-CM | POA: Diagnosis not present

## 2017-05-22 DIAGNOSIS — Z13228 Encounter for screening for other metabolic disorders: Secondary | ICD-10-CM | POA: Diagnosis not present

## 2017-05-22 DIAGNOSIS — E162 Hypoglycemia, unspecified: Secondary | ICD-10-CM | POA: Diagnosis not present

## 2017-05-22 DIAGNOSIS — J302 Other seasonal allergic rhinitis: Secondary | ICD-10-CM

## 2017-05-22 DIAGNOSIS — IMO0001 Reserved for inherently not codable concepts without codable children: Secondary | ICD-10-CM

## 2017-05-22 DIAGNOSIS — N926 Irregular menstruation, unspecified: Secondary | ICD-10-CM | POA: Diagnosis not present

## 2017-05-22 DIAGNOSIS — R12 Heartburn: Secondary | ICD-10-CM | POA: Diagnosis not present

## 2017-05-22 DIAGNOSIS — Z789 Other specified health status: Secondary | ICD-10-CM | POA: Diagnosis not present

## 2017-05-22 MED ORDER — RANITIDINE HCL 150 MG PO TABS: 150.0000 mg | ORAL_TABLET | Freq: Two times a day (BID) | ORAL | 1 refills | Status: DC

## 2017-05-22 MED ORDER — NORGESTIM-ETH ESTRAD TRIPHASIC 0.18/0.215/0.25 MG-35 MCG PO TABS: 1.0000 | ORAL_TABLET | Freq: Every day | ORAL | 11 refills | Status: DC

## 2017-05-22 MED ORDER — MONTELUKAST SODIUM 10 MG PO TABS: 10.0000 mg | ORAL_TABLET | Freq: Every day | ORAL | 3 refills | Status: DC

## 2017-05-22 NOTE — Patient Instructions (Addendum)
D methyl folate -- look into this for depression    IF you received an x-ray today, you will receive an invoice from [facility]. Please contact [facility] at [phone] with questions or concerns regarding your invoice.   IF you received labwork today, you will receive an invoice from [facility]. Please contact [facility] at [phone] with questions or concerns regarding your invoice.   Our billing staff will not be able to assist you with questions regarding bills from these companies.  You will be contacted with the lab results as soon as they are available. The fastest way to get your results is to activate your My Chart account. Instructions are located on the last page of this paperwork. If you have not heard from [place] regarding the results in 2 weeks, please contact this office.

## 2017-05-22 NOTE — Progress Notes (Signed)
Summer Shields  MRN: [id number] DOB: Feb 11, 1985  PCP: Mancel Bale, PA-C   Chief Complaint  Patient presents with  . Annual Exam    with pap   . Depression    screening was a 10     Subjective:  Pt presents to clinic for a CPE.  Last dental exam: not in a while (about 3 years) Last vision exam: wears glasses - went last month Last pap: 2016 - normal - sexually active with 1 man who is sexualy active with another woman also - no concerns about STIs. Vaccinations      Tetanus - needs  She keeps Tums every where for recent heartburn- not associated with certain foods/drinks - not worse when she lays down - TUMS seem to help a little bit - most she takes is bid - 2-3 times a week.  Does seem worse during the week.  She wonders if stress at work as part of her trigger for her heartburn.  She has been researching Lupus and she has a lot of symptoms - she would like to be tested for that.  GI issues, fatigue, depression, swelling of hands.  She would like to be tested for this.  There is no family history none.   She is also interested in having her insulin levels checked.  Her mother has insulin abnormalities.  Patient is experiencing some low blood sugar symptoms.  Patient continues to have problems with her seasonal allergies even though she is using OTC and histamine as well as nasal steroid.  She is continuing to cough.  She has been on Singulair in the past which has helped.  Patient is having breakthrough bleeding even on OCPs taken daily.  Her breakthrough bleeding tends to be within the first 14 days of her new pill pack she has several days of spotting.  Then she has a 2 to 3-day period during her placebo week.  She has been on multiple other birth controls in the past but recently was changed to [place] low this is the pill that she has been on since the spotting has been going on for the last several months.  Stressful job in Therapist, art.  Typical meals for  patient: sandwiches, TV dinner Typical beverage choices: water (60oz), 1-2 diet soda a day Exercises: trying to but no energy from her depression Sleeps: weekends 12-13 hours - weekday 8 (woken up by alarm for work) - trying to make an effort to go to bed and wake up the same time every day  Patient Active Problem List   Diagnosis Date Noted  . Hypersomnia 10/03/2015  . Periodic limb movement 10/03/2015  . Anxiety state -see psychiatry 04/08/2015  . Allergic rhinitis due to pollen 04/03/2014  . Major depression, chronic (HCC) - not doing great - she crashed after the magnetic therapy  - there have been conversation about ECt - she has started CBD oil last week - marijuana has worked for her brothers depression 05/10/2013  . Sprain, strain, toes 05/25/2012  . Metatarsalgia of both feet 04/11/2012  . Gait abnormality 04/11/2012  . Plantar fasciitis 04/20/2011    Patient Care Team: Mittie Bodo as PCP - [facility], [facility]  Review of Systems  Constitutional: Negative.   HENT: Negative.   Eyes: Negative.   Respiratory: Negative.   Cardiovascular: Negative.   Gastrointestinal: Negative.        Indigestion  Endocrine: Negative.   Genitourinary: Negative.  Musculoskeletal: Positive for arthralgias (worse in her feet - but everything hurts - she also notices swelling in a lot of her joints at time).  Skin: Negative.  Negative for rash.  Allergic/Immunologic: Negative.   Neurological: Negative.   Hematological: Negative.   Psychiatric/Behavioral: Positive for dysphoric mood and sleep disturbance (hypersomnolence). Negative for suicidal ideas. The patient is nervous/anxious.      Current Outpatient Medications on File Prior to Visit  Medication Sig Dispense Refill  . Armodafinil 250 MG tablet Take 0.5 tablets by mouth daily.  0  . cholecalciferol (VITAMIN D) 1000 units tablet Take 2,000 Units by mouth daily.    . fluticasone (FLONASE) 50  MCG/ACT nasal spray SHAKE LIQUID AND USE 2 SPRAYS IN EACH NOSTRIL DAILY 16 g 3  . Guaifenesin (MUCINEX MAXIMUM STRENGTH) 1200 MG TB12 Take 1 tablet (1,200 mg total) by mouth every 12 (twelve) hours as needed. 14 tablet 1  . loratadine (CLARITIN) 10 MG tablet Take 10 mg by mouth daily.    Marland Kitchen MAGNESIUM PO Take 1 tablet by mouth daily.    Marland Kitchen venlafaxine XR (EFFEXOR-XR) 75 MG 24 hr capsule TAKE 1 CAPSULE(75 MG) BY MOUTH DAILY WITH BREAKFAST 30 capsule 0  . vitamin B-12 (CYANOCOBALAMIN) 1000 MCG tablet Take 1,000 mcg by mouth daily.    . pramipexole (MIRAPEX) 0.25 MG tablet   0   No current facility-administered medications on file prior to visit.     Allergies  Allergen Reactions  . Lamictal [Lamotrigine] Rash  . Codeine Nausea And Vomiting  . Tramadol Nausea And Vomiting  . Amoxicillin Rash    Has patient had a PCN reaction causing immediate rash, facial/tongue/throat swelling, SOB or lightheadedness with hypotension: Yes Has patient had a PCN reaction causing severe rash involving mucus membranes or skin necrosis: No Has patient had a PCN reaction that required hospitalization: No Has patient had a PCN reaction occurring within the last 10 years: No If all of the above answers are "NO", then may proceed with Cephalosporin use.     Social History   Socioeconomic History  . Marital status: Single    Spouse name: Biomedical scientist  . Number of children: 0  . Years of education: college  . Highest education level: Not on file  Occupational History  . Not on file  Social Needs  . Financial resource strain: Not on file  . Food insecurity:    Worry: Not on file    Inability: Not on file  . Transportation needs:    Medical: Not on file    Non-medical: Not on file  Tobacco Use  . Smoking status: Never Smoker  . Smokeless tobacco: Never Used  Substance and Sexual Activity  . Alcohol use: No    Alcohol/week: 0.0 oz  . Drug use: No  . Sexual activity: Yes    Birth control/protection:  Pill  Lifestyle  . Physical activity:    Days per week: Not on file    Minutes per session: Not on file  . Stress: Not on file  Relationships  . Social connections:    Talks on phone: Not on file    Gets together: Not on file    Attends religious service: Not on file    Active member of club or organization: Not on file    Attends meetings of clubs or organizations: Not on file    Relationship status: Not on file  Other Topics Concern  . Not on file  Social History Narrative  Marital status: single; dating seriously x 6 years - in family unit       Children: none      Lives: alone      Employment:        Tobacco; none       Alcohol: none      Drugs: none      Exercise: none currently.      Caffeine: 2-3 cups a day     Past Surgical History:  Procedure Laterality Date  . BUNIONECTOMY Bilateral    04/09/2013 left, 06/30/2013 right,   . bunionectomy revision Right    08/2013  . FOOT SURGERY    . open plantar fasciotomy Right    01/16/2014  . plantar topaz Bilateral    04/09/2013, 06/30/2013  . TONSILLECTOMY    . WISDOM TOOTH EXTRACTION      Family History  Problem Relation Age of Onset  . Diabetes Maternal Grandfather   . Hypertension Maternal Grandfather   . Stroke Maternal Grandfather   . Hyperlipidemia Maternal Grandfather   . Alcohol abuse Maternal Grandfather   . Depression Father   . Cancer Father   . ADD / ADHD Sister   . Mental illness Brother        anxiety  . Anxiety disorder Brother   . Alcohol abuse Brother   . Cancer Paternal Grandmother        skin  . Cancer Mother      Objective:  BP 124/70   Pulse (!) 121   Temp 98.9 F (37.2 C) (Oral)   Resp 18   Ht '5\' 8"'  (1.727 m)   Wt 269 lb 9.6 oz (122.3 kg)   LMP 05/10/2017   SpO2 97%   BMI 40.99 kg/m   Physical Exam  Constitutional: She is oriented to person, place, and time.  HENT:  Head: Normocephalic and atraumatic.  Right Ear: Hearing, tympanic membrane, external ear and ear canal  normal.  Left Ear: Hearing, tympanic membrane, external ear and ear canal normal.  Nose: Nose normal.  Mouth/Throat: Uvula is midline, oropharynx is clear and moist and mucous membranes are normal.  Eyes: Pupils are equal, round, and reactive to light. Conjunctivae and EOM are normal.  Neck: Trachea normal and normal range of motion. Neck supple. No thyroid mass and no thyromegaly present.  Cardiovascular: Normal rate, regular rhythm and normal heart sounds.  No murmur heard. Pulmonary/Chest: Effort normal and breath sounds normal. She has no wheezes. Right breast exhibits no inverted nipple, no mass, no nipple discharge, no skin change and no tenderness. Left breast exhibits no inverted nipple, no mass, no nipple discharge, no skin change and no tenderness.  Abdominal: Soft. Bowel sounds are normal. There is no tenderness.  Genitourinary: Uterus normal. Cervix exhibits no motion tenderness, no discharge and no friability. Right adnexum displays no mass, no tenderness and no fullness. Left adnexum displays no mass, no tenderness and no fullness. There is bleeding (friable cervix) in the vagina. No erythema or tenderness in the vagina. No foreign body in the vagina. No signs of injury around the vagina. No vaginal discharge found.  Musculoskeletal: Normal range of motion.  Lymphadenopathy:    She has no cervical adenopathy.  Neurological: She is alert and oriented to person, place, and time. She has normal strength and normal reflexes.  Skin: Skin is warm and dry.  Psychiatric: Judgment normal.    Wt Readings from Last 3 Encounters:  05/22/17 269 lb 9.6 oz (122.3 kg)  04/28/17  269 lb (122 kg)  02/09/17 274 lb (124.3 kg)     Visual Acuity Screening   Right eye Left eye Both eyes  Without correction:     With correction: '20/20 20/20 20/15 '    Assessment and Plan :  Annual physical exam  Fatigue, unspecified type - Plan: CBC with Differential/Platelet, TSH, VITAMIN D 25 Hydroxy (Vit-D  Deficiency, Fractures) -patient has significant depression which often leads to fatigue.  The fact that her depression is slightly worse than right now although she is working with the psychiatrist to improve her medication.  We will check labs to see if there is other cause of her fatigue.  Finger swelling - Plan: Sedimentation rate, ANA,IFA RA Diag Pnl w/rflx Tit/Patn patient has done some research on with this and would like to be checked for this. -   Heartburn - Plan: ranitidine (ZANTAC) 150 MG tablet -patient is having intermittent irregular heartburn symptoms which Tums for the most part help.  We will start Zantac that she will take twice a day for 2 weeks for control and then use as needed after that.  She will follow-up if this does not help.  She will be mindful of high caffeine fatty foods that will pop potentially increase her symptoms.  She will also be mindful of eating late at night.  Need for diphtheria-tetanus-pertussis (Tdap) vaccine - Plan: Tdap vaccine greater than or equal to 7yo IM will update for patient today. -   Encounter for gynecological examination without abnormal finding - Plan: Pap IG w/ reflex to HPV when ASC-U  Seasonal allergies - Plan: montelukast (SINGULAIR) 10 MG tablet -continue nasal spray and oral antihistamine add Singulair to see if we can control allergies better.  We are at the end of allergy season so hopefully this will help.  Screening, lipid - Plan: Lipid panel -check labs  Screening for diabetes mellitus (DM) - Plan: Hemoglobin A1c  Screening for metabolic disorder - Plan: CMP14+EGFR  Low blood sugar - Plan: Insulin and C-Peptide check labs-patient does have a regular eating schedule which potentially is causing her symptoms.  Birth control - Plan: Norgestimate-Ethinyl Estradiol Triphasic (TRINESSA, 28,) 0.18/0.215/0.25 MG-35 MCG tablet -patient is spotting during the first 2 weeks of her pill likely not enough estrogen to control her bleeding.   We will increase her estrogen over the next 3 months and see if that controls her problems.  Vitamin D deficiency - Plan: VITAMIN D 25 Hydroxy (Vit-D Deficiency, Fractures) -patient continues to do over-the-counter supplements will check labs to see if she is getting adequate supplementation.  [name] PA-C  [facility] 05/22/2017 10:59 AM

## 2017-05-24 LAB — LIPID PANEL
CHOLESTEROL TOTAL: 178 mg/dL (ref 100–199)
Chol/HDL Ratio: 3.1 ratio (ref 0.0–4.4)
HDL: 58 mg/dL (ref >39)
LDL Calculated: 94 mg/dL (ref 0–99)
TRIGLYCERIDES: 128 mg/dL (ref 0–149)
VLDL CHOLESTEROL CAL: 26 mg/dL (ref 5–40)

## 2017-05-24 LAB — CMP14+EGFR
A/G RATIO: 2 (ref 1.2–2.2)
ALT: 12 IU/L (ref 0–32)
AST: 13 IU/L (ref 0–40)
Albumin: 4.2 g/dL (ref 3.5–5.5)
Alkaline Phosphatase: 57 IU/L (ref 39–117)
BILIRUBIN TOTAL: 0.3 mg/dL (ref 0.0–1.2)
BUN/Creatinine Ratio: 13 (ref 9–23)
BUN: 10 mg/dL (ref 6–20)
CHLORIDE: 107 mmol/L — AB (ref 96–106)
CO2: 19 mmol/L — ABNORMAL LOW (ref 20–29)
Calcium: 9 mg/dL (ref 8.7–10.2)
Creatinine, Ser: 0.78 mg/dL (ref 0.57–1.00)
GFR calc Af Amer: 116 mL/min/{1.73_m2} (ref >59)
GFR, EST NON AFRICAN AMERICAN: 101 mL/min/{1.73_m2} (ref >59)
Globulin, Total: 2.1 g/dL (ref 1.5–4.5)
Glucose: 89 mg/dL (ref 65–99)
POTASSIUM: 4.2 mmol/L (ref 3.5–5.2)
Sodium: 141 mmol/L (ref 134–144)
Total Protein: 6.3 g/dL (ref 6.0–8.5)

## 2017-05-24 LAB — CBC WITH DIFFERENTIAL/PLATELET
BASOS: 0 %
Basophils Absolute: 0 10*3/uL (ref 0.0–0.2)
EOS (ABSOLUTE): 0.4 10*3/uL (ref 0.0–0.4)
Eos: 4 %
Hematocrit: 41.6 % (ref 34.0–46.6)
Hemoglobin: 13.6 g/dL (ref 11.1–15.9)
Immature Grans (Abs): 0.1 10*3/uL (ref 0.0–0.1)
Immature Granulocytes: 1 %
Lymphocytes Absolute: 3.4 10*3/uL — ABNORMAL HIGH (ref 0.7–3.1)
Lymphs: 35 %
MCH: 28.6 pg (ref 26.6–33.0)
MCHC: 32.7 g/dL (ref 31.5–35.7)
MCV: 87 fL (ref 79–97)
MONOS ABS: 0.6 10*3/uL (ref 0.1–0.9)
Monocytes: 6 %
NEUTROS ABS: 5.3 10*3/uL (ref 1.4–7.0)
NEUTROS PCT: 54 %
PLATELETS: 223 10*3/uL (ref 150–379)
RBC: 4.76 x10E6/uL (ref 3.77–5.28)
RDW: 14.2 % (ref 12.3–15.4)
WBC: 9.7 10*3/uL (ref 3.4–10.8)

## 2017-05-24 LAB — HEMOGLOBIN A1C
ESTIMATED AVERAGE GLUCOSE: 105 mg/dL
Hgb A1c MFr Bld: 5.3 % (ref 4.8–5.6)

## 2017-05-24 LAB — ANA,IFA RA DIAG PNL W/RFLX TIT/PATN
ANA Titer 1: NEGATIVE
CYCLIC CITRULLIN PEPTIDE AB: 2 U (ref 0–19)
Rhuematoid fact SerPl-aCnc: <10 IU/mL (ref 0.0–13.9)

## 2017-05-24 LAB — VITAMIN D 25 HYDROXY (VIT D DEFICIENCY, FRACTURES): VIT D 25 HYDROXY: 43.9 ng/mL (ref 30.0–100.0)

## 2017-05-24 LAB — INSULIN AND C-PEPTIDE, SERUM
C-Peptide: 3.8 ng/mL (ref 1.1–4.4)
INSULIN: 30.1 u[IU]/mL — AB (ref 2.6–24.9)

## 2017-05-24 LAB — TSH: TSH: 3.57 u[IU]/mL (ref 0.450–4.500)

## 2017-05-24 LAB — SEDIMENTATION RATE: SED RATE: 11 mm/h (ref 0–32)

## 2017-05-25 ENCOUNTER — Encounter: Payer: Self-pay | Admitting: Physician Assistant

## 2017-05-25 LAB — PAP IG W/ RFLX HPV ASCU: PAP Smear Comment: 0

## 2017-05-26 ENCOUNTER — Encounter: Payer: Self-pay | Admitting: Family Medicine

## 2017-05-31 ENCOUNTER — Encounter: Payer: Self-pay | Admitting: Family Medicine

## 2017-06-21 ENCOUNTER — Encounter: Payer: Self-pay | Admitting: Physician Assistant

## 2017-07-27 ENCOUNTER — Other Ambulatory Visit: Payer: Self-pay | Admitting: Family Medicine

## 2017-07-27 DIAGNOSIS — J3089 Other allergic rhinitis: Secondary | ICD-10-CM

## 2017-07-28 NOTE — Telephone Encounter (Signed)
Fluticasone refill Last Refill:01/05/17 # 16g 3 RF Last OV: 05/22/17 PCP: [name] PA Paul

## 2017-10-08 ENCOUNTER — Other Ambulatory Visit: Payer: Self-pay | Admitting: Physician Assistant

## 2017-10-08 DIAGNOSIS — J3089 Other allergic rhinitis: Secondary | ICD-10-CM

## 2017-10-08 DIAGNOSIS — J302 Other seasonal allergic rhinitis: Secondary | ICD-10-CM

## 2018-12-01 IMAGING — CT CT ANGIO CHEST
2 of 6 series · 18 of 46 positions shown · IV contrast (ISOVUE)
Comparison: CXR exams dating back through 04/26/2017

CLINICAL DATA: Dyspnea and cough

EXAM:
CT ANGIOGRAPHY CHEST WITH CONTRAST
TECHNIQUE: Multidetector CT imaging of the chest was performed using the
standard protocol during bolus administration of intravenous
contrast. Multiplanar CT image reconstructions and MIPs were
obtained to evaluate the vascular anatomy.
CONTRAST:  100mL TVXZWT-94X IOPAMIDOL (TVXZWT-94X) INJECTION 76%

[Series 5: thins · axial · 0.67mm/px · z∈[+1402,+1642]mm · 15 of 265 slices shown]
[im 12/265  lung]
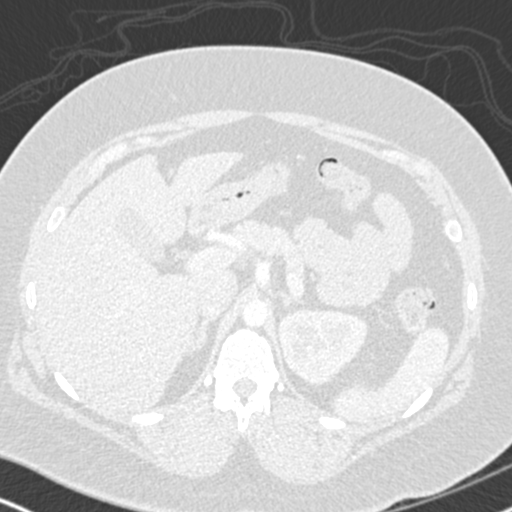
[im 35/265  soft-tissue]
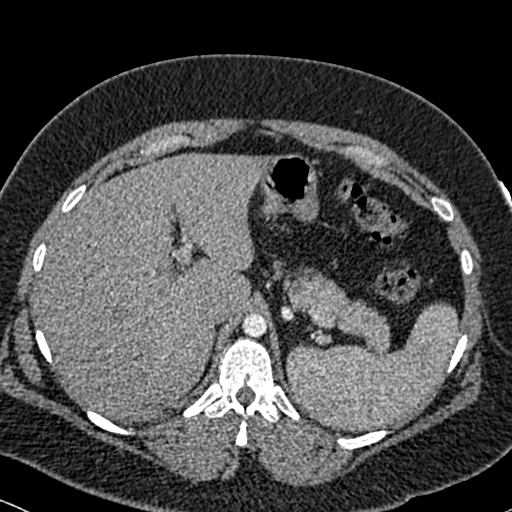
[im 46/265  lung]
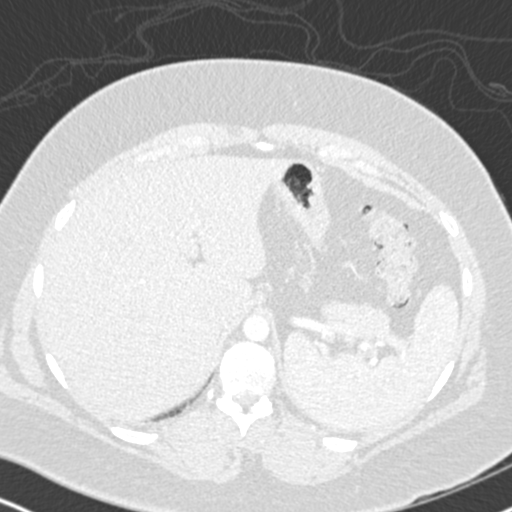
[im 69/265  soft-tissue]
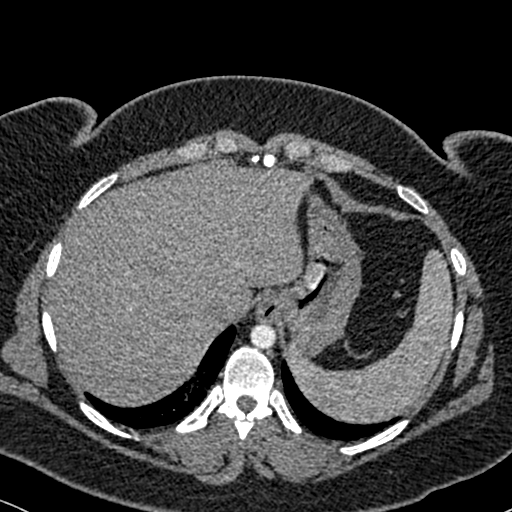
[im 81/265  lung]
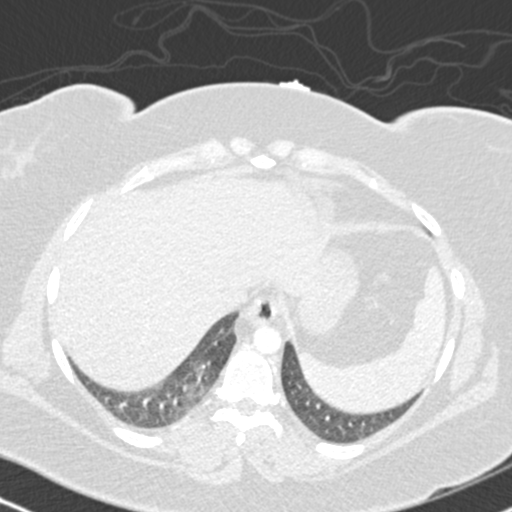
[im 104/265  soft-tissue]
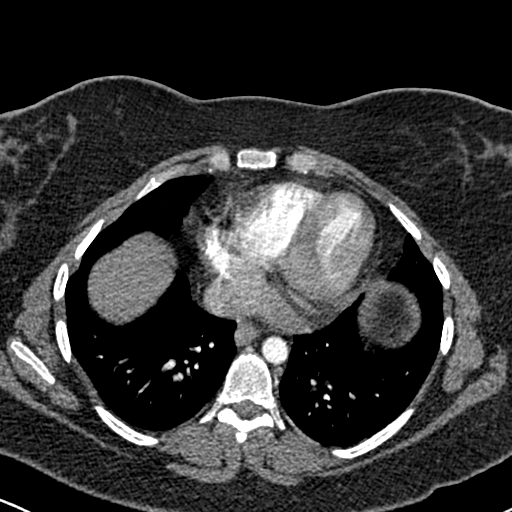
[im 115/265  lung]
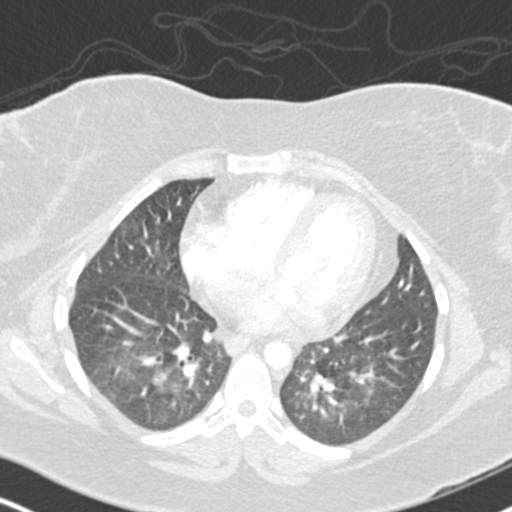
[im 138/265  soft-tissue]
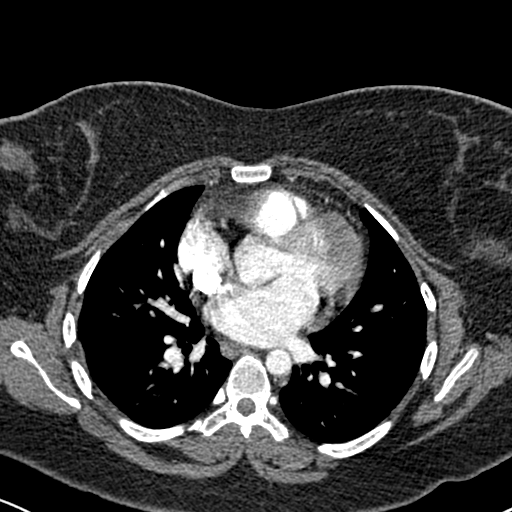
[im 150/265  lung]
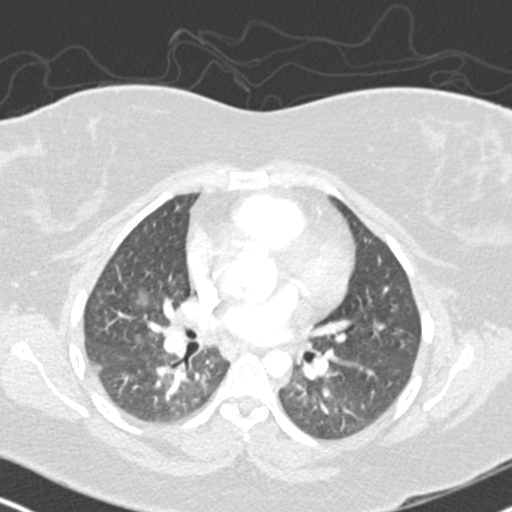
[im 161/265  soft-tissue]
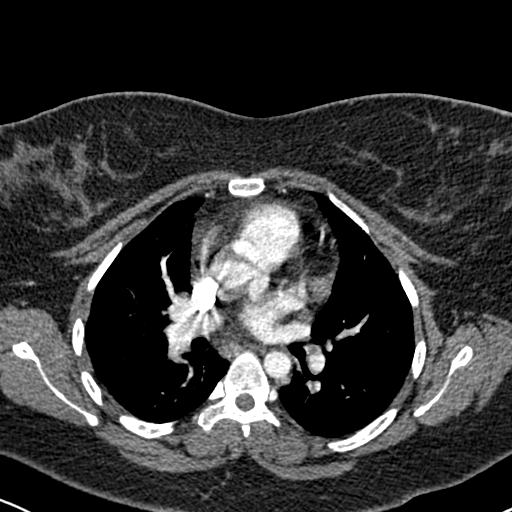
[im 184/265  lung]
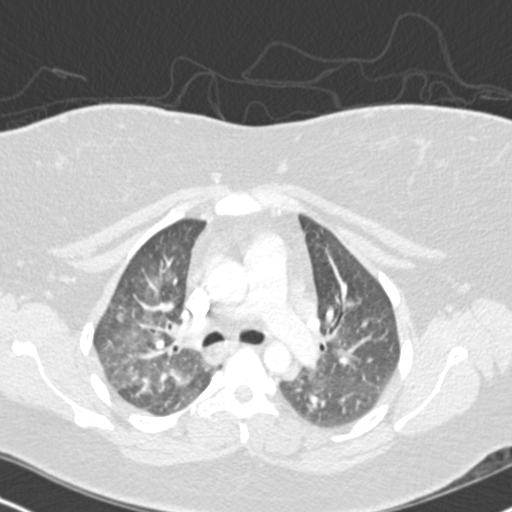
[im 196/265  soft-tissue]
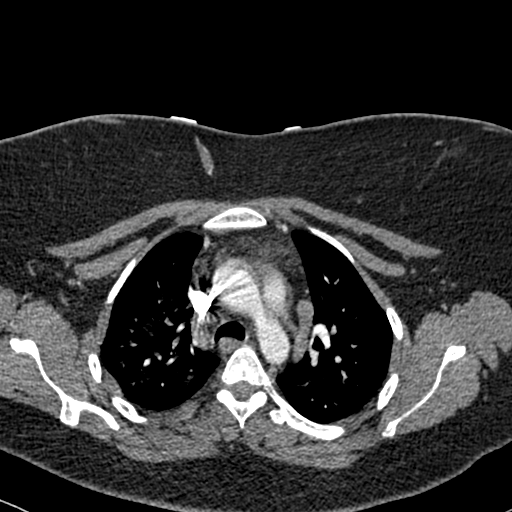
[im 219/265  lung]
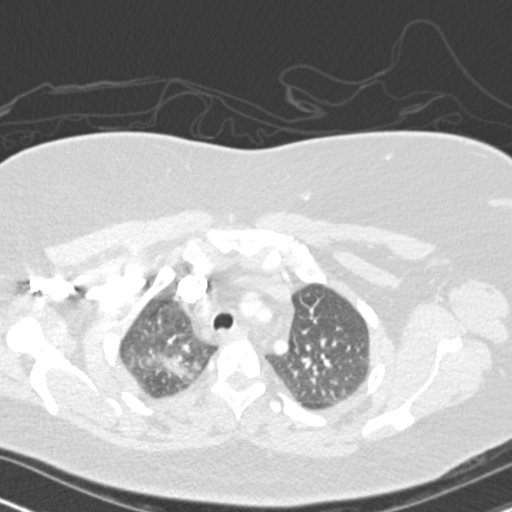
[im 230/265  soft-tissue]
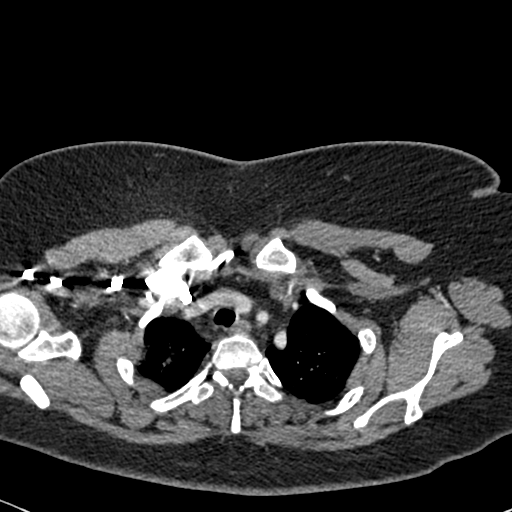
[im 253/265  lung]
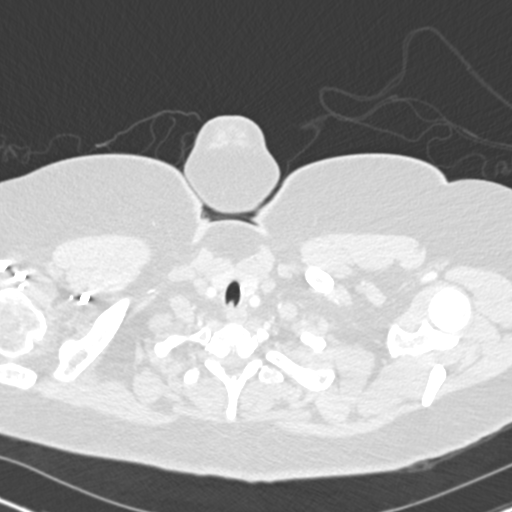

[Series 7: coronal mpr · coronal · 0.53mm/px · 3 of 156 slices shown]
[im 39/156  soft-tissue]
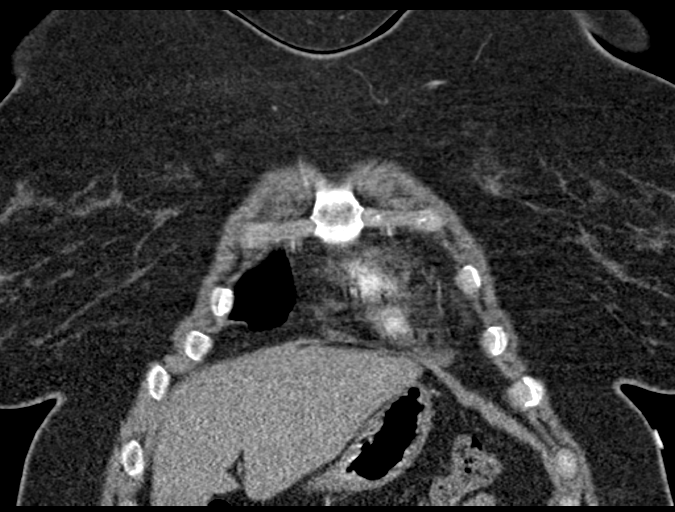
[im 78/156  soft-tissue]
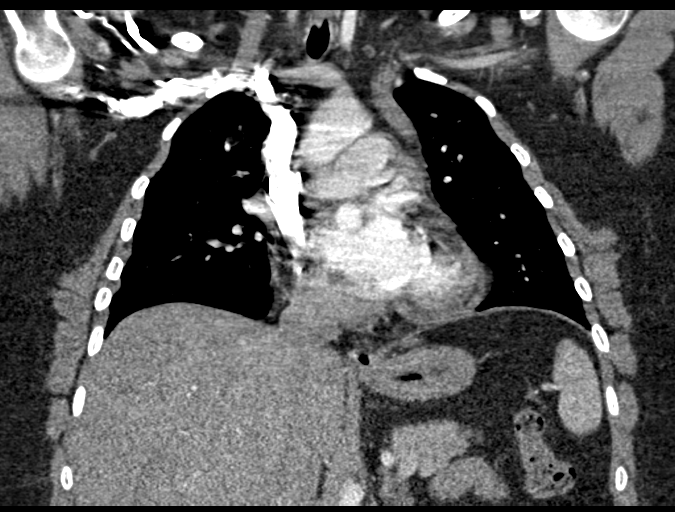
[im 117/156  soft-tissue]
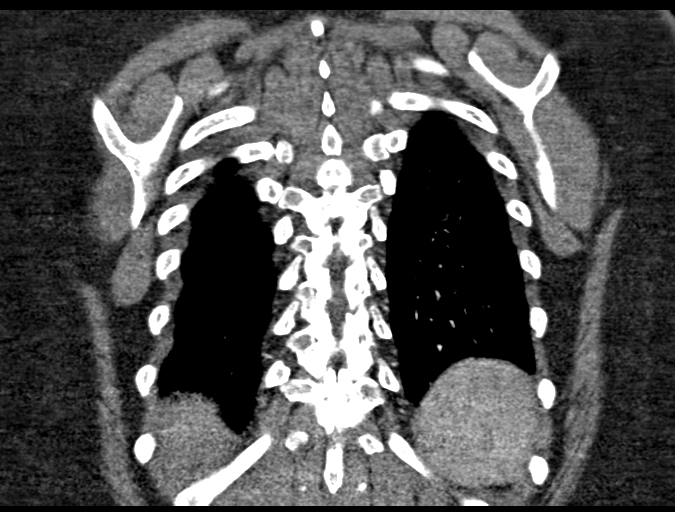

[18 of 46 positions shown; findings below may reference images not displayed]

FINDINGS: Cardiovascular: Satisfactory opacification of the pulmonary arteries
to the segmental level. No evidence of pulmonary embolism. Normal
heart size. No pericardial effusion.

Mediastinum/Nodes: Mild reactive right lower paratracheal and hilar
lymph nodes measuring up to 9 mm. Patent trachea and mainstem
bronchi. Unremarkable esophagus thyroid.

Lungs/Pleura: Pulmonary consolidations in the right upper lobe and
to a lesser degree the right middle and both lower lobes are
identified consistent with multilobar pneumonia. No effusion or
pneumothorax.

Upper Abdomen: No acute abnormality.

Musculoskeletal: No chest wall abnormality. No acute or significant
osseous findings.

Review of the MIP images confirms the above findings.
IMPRESSION: 1. Multilobar pneumonia, greatest in the right upper lobe.
2. No acute pulmonary embolus.

## 2018-12-01 IMAGING — CR DG CHEST 2V
2 series · 2 of 2 positions shown · non-contrast
Comparison: 04/26/2017

CLINICAL DATA: Cough for 2 months.  Shortness of breath.

EXAM:
CHEST - 2 VIEW

[w chest pa]
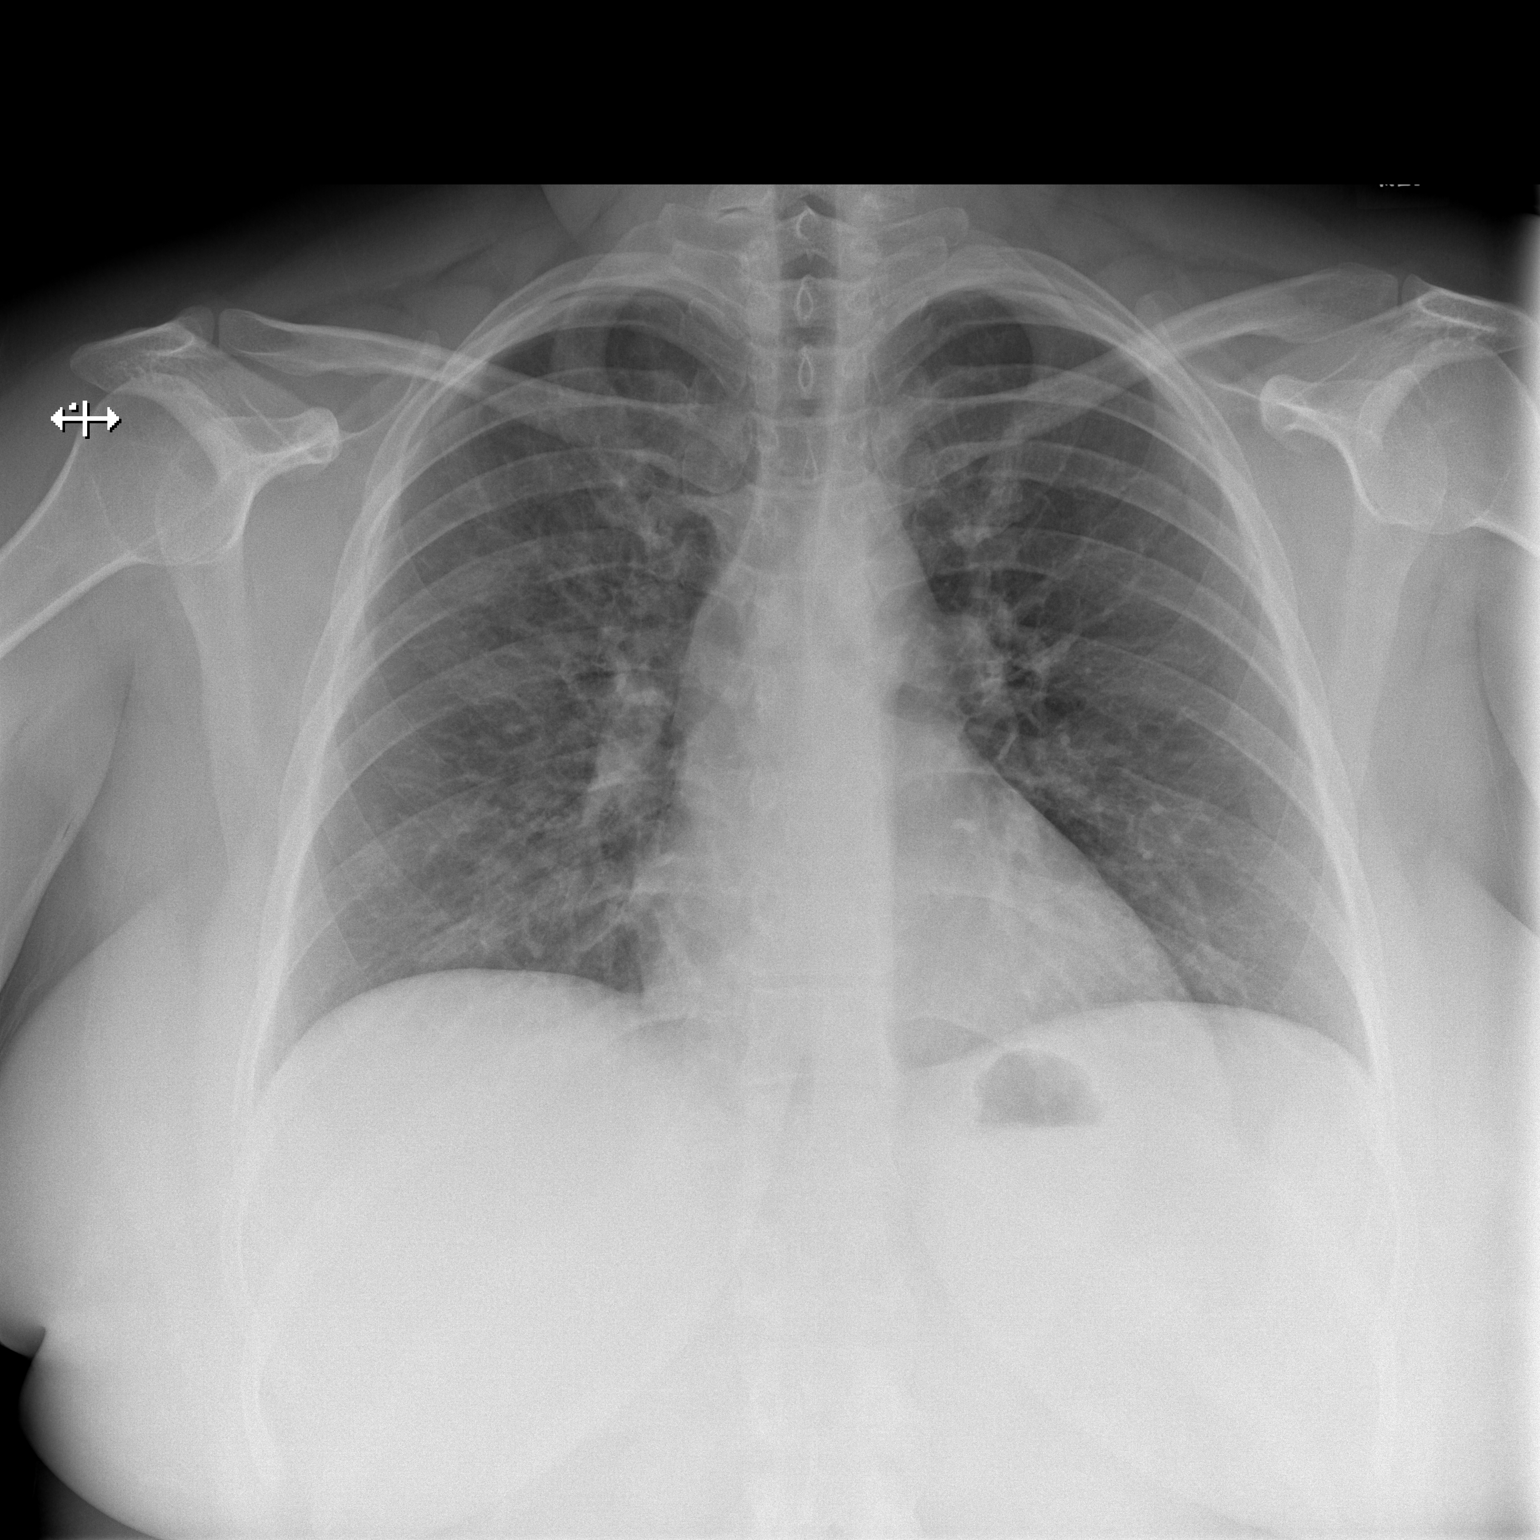

[w chest lat]
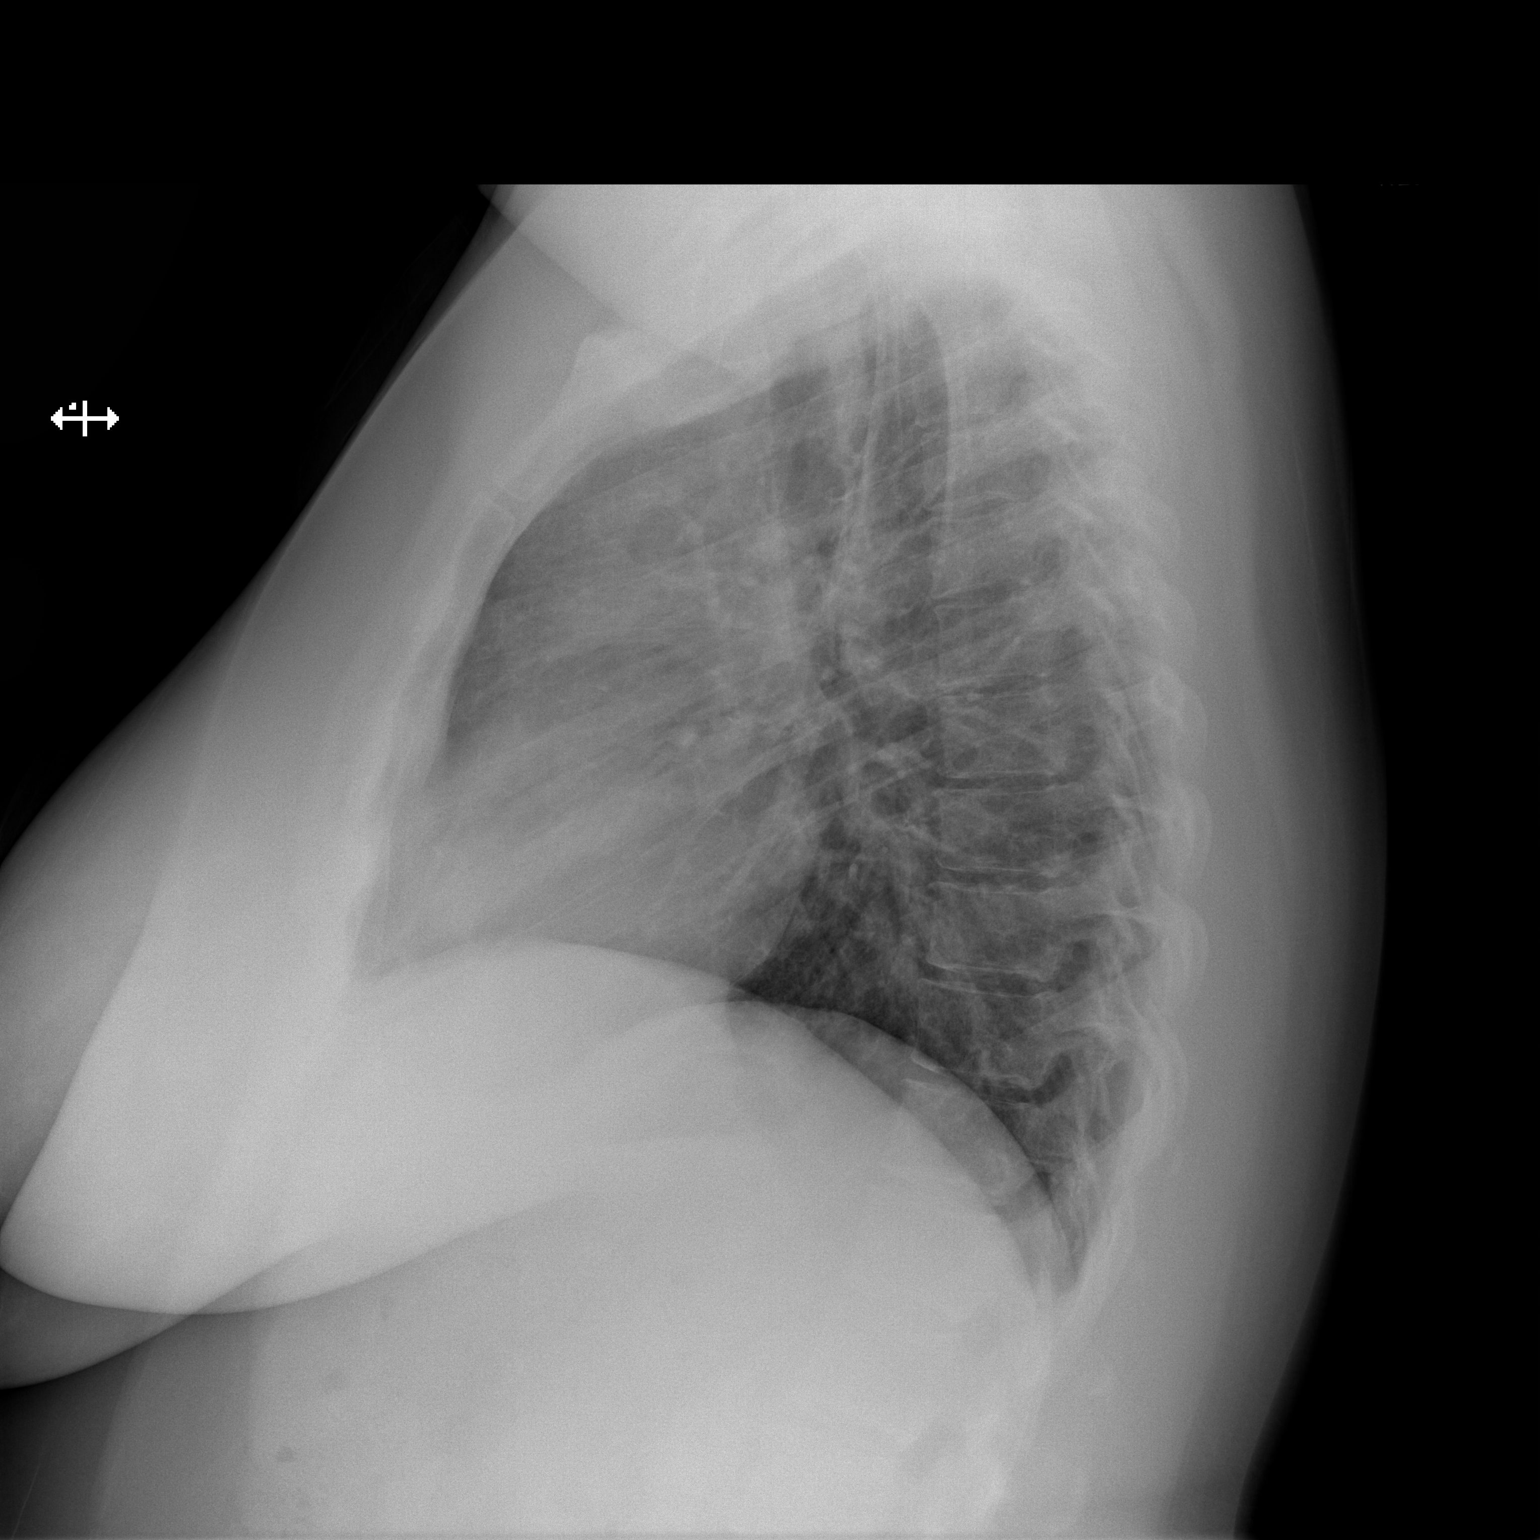

[2 of 2 positions shown; findings below may reference images not displayed]

FINDINGS: The cardiomediastinal silhouette is within normal limits. Patchy
right upper lobe airspace opacity on the prior study has mildly
improved. The left lung remains clear. No pleural effusion or
pneumothorax is identified. No acute osseous abnormality is seen.
IMPRESSION: Mildly decreased right upper lobe opacity which may reflect
improving pneumonia.

## 2020-04-21 ENCOUNTER — Telehealth (INDEPENDENT_AMBULATORY_CARE_PROVIDER_SITE_OTHER): Payer: Self-pay

## 2020-04-21 NOTE — Telephone Encounter (Signed)
Called pt LMTRC. Pt was referred by a friend.

## 2020-07-17 DIAGNOSIS — M1711 Unilateral primary osteoarthritis, right knee: Secondary | ICD-10-CM | POA: Diagnosis not present

## 2021-01-21 DIAGNOSIS — F331 Major depressive disorder, recurrent, moderate: Secondary | ICD-10-CM | POA: Diagnosis not present

## 2021-03-17 DIAGNOSIS — F338 Other recurrent depressive disorders: Secondary | ICD-10-CM | POA: Diagnosis not present

## 2021-03-17 DIAGNOSIS — R41840 Attention and concentration deficit: Secondary | ICD-10-CM | POA: Diagnosis not present

## 2021-03-17 DIAGNOSIS — F419 Anxiety disorder, unspecified: Secondary | ICD-10-CM | POA: Diagnosis not present

## 2021-03-19 DIAGNOSIS — R41840 Attention and concentration deficit: Secondary | ICD-10-CM | POA: Diagnosis not present

## 2021-03-22 DIAGNOSIS — F331 Major depressive disorder, recurrent, moderate: Secondary | ICD-10-CM | POA: Diagnosis not present

## 2021-04-16 DIAGNOSIS — R350 Frequency of micturition: Secondary | ICD-10-CM | POA: Diagnosis not present

## 2021-04-16 DIAGNOSIS — R300 Dysuria: Secondary | ICD-10-CM | POA: Diagnosis not present

## 2021-05-06 DIAGNOSIS — F902 Attention-deficit hyperactivity disorder, combined type: Secondary | ICD-10-CM | POA: Diagnosis not present

## 2021-05-06 DIAGNOSIS — F339 Major depressive disorder, recurrent, unspecified: Secondary | ICD-10-CM | POA: Diagnosis not present

## 2021-05-06 DIAGNOSIS — Z79899 Other long term (current) drug therapy: Secondary | ICD-10-CM | POA: Diagnosis not present

## 2021-05-06 DIAGNOSIS — F419 Anxiety disorder, unspecified: Secondary | ICD-10-CM | POA: Diagnosis not present

## 2021-05-06 DIAGNOSIS — R41840 Attention and concentration deficit: Secondary | ICD-10-CM | POA: Diagnosis not present

## 2021-05-11 DIAGNOSIS — F322 Major depressive disorder, single episode, severe without psychotic features: Secondary | ICD-10-CM | POA: Diagnosis not present

## 2021-05-11 DIAGNOSIS — Z13228 Encounter for screening for other metabolic disorders: Secondary | ICD-10-CM | POA: Diagnosis not present

## 2021-05-11 DIAGNOSIS — R5383 Other fatigue: Secondary | ICD-10-CM | POA: Diagnosis not present

## 2021-05-11 DIAGNOSIS — Z1389 Encounter for screening for other disorder: Secondary | ICD-10-CM | POA: Diagnosis not present

## 2021-05-11 DIAGNOSIS — Z13220 Encounter for screening for lipoid disorders: Secondary | ICD-10-CM | POA: Diagnosis not present

## 2021-05-11 DIAGNOSIS — Z6841 Body Mass Index (BMI) 40.0 and over, adult: Secondary | ICD-10-CM | POA: Diagnosis not present

## 2021-05-11 DIAGNOSIS — Z0000 Encounter for general adult medical examination without abnormal findings: Secondary | ICD-10-CM | POA: Diagnosis not present

## 2021-05-11 DIAGNOSIS — Z1321 Encounter for screening for nutritional disorder: Secondary | ICD-10-CM | POA: Diagnosis not present

## 2021-05-11 DIAGNOSIS — F900 Attention-deficit hyperactivity disorder, predominantly inattentive type: Secondary | ICD-10-CM | POA: Diagnosis not present

## 2021-05-11 DIAGNOSIS — Z130 Encounter for screening for diseases of the blood and blood-forming organs and certain disorders involving the immune mechanism: Secondary | ICD-10-CM | POA: Diagnosis not present

## 2021-05-11 DIAGNOSIS — Z01419 Encounter for gynecological examination (general) (routine) without abnormal findings: Secondary | ICD-10-CM | POA: Diagnosis not present

## 2021-05-12 DIAGNOSIS — F331 Major depressive disorder, recurrent, moderate: Secondary | ICD-10-CM | POA: Diagnosis not present

## 2021-06-09 DIAGNOSIS — F331 Major depressive disorder, recurrent, moderate: Secondary | ICD-10-CM | POA: Diagnosis not present

## 2021-07-08 ENCOUNTER — Ambulatory Visit: Payer: BC Managed Care – PPO | Admitting: Psychiatry

## 2021-07-15 ENCOUNTER — Ambulatory Visit: Payer: BC Managed Care – PPO | Admitting: Psychiatry

## 2021-07-27 ENCOUNTER — Ambulatory Visit (INDEPENDENT_AMBULATORY_CARE_PROVIDER_SITE_OTHER): Payer: BC Managed Care – PPO | Admitting: Psychiatry

## 2021-07-27 DIAGNOSIS — F332 Major depressive disorder, recurrent severe without psychotic features: Secondary | ICD-10-CM

## 2021-07-27 NOTE — Progress Notes (Signed)
Virtual Visit via Telephone Note  I connected with Summer Shields on 07/27/21 at  1:20 PM EDT by telephone and verified that I am speaking with the correct person using two identifiers.  Location: Patient: Home Provider: Hospital   I discussed the limitations, risks, security and privacy concerns of performing an evaluation and management service by telephone and the availability of in person appointments. I also discussed with the patient that there may be a patient responsible charge related to this service. The patient expressed understanding and agreed to proceed.   History of Present Illness: Patient reached by telephone for ECT consult.  This is a 36 year old woman who was referred by her providers at [facility] for consideration of ECT.  Patient knew the nature of the appointment and was prepared.  She reports that she is suffered from depression since she was an adolescent and had treatment for about the last 10 years.  She describes her symptoms as being chronic low mood dysphoria and a sense of hopelessness low energy and lack of enjoyment of most activities.  A lot of these are chronic with a little bit of ups and downs.  Recently especially the past year has been feeling much worse.  He has chronic suicidal ideation but has no intention or plan of acting on it.  Denies any psychotic symptoms.  She is currently seeing an outpatient mental health provider for medication management.  She is taking Pristiq and Wellbutrin with minimal benefit.  Patient sleeps a lot feels tired a great deal.  31 year old woman lives with a roommate.  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  Somewhat strained relationship with much of her family.  Patient reports that she drinks very little and uses no drugs and has not had any substance abuse problems.  Long history of treatment.  Reports having been on multiple antidepressants as well as lamotrigine.  Lamotrigine was briefly helpful but caused a  severe rash.  TMS briefly helpful but also wore off quickly.  Medical history: Minimal medical problems outside of psychiatric.  History of plantar fasciitis with surgery.  No heart disease.  Never had any problem with anesthesia    Observations/Objective: Patient was prepared for the appointment.  Alert and oriented.  Mood was depressed.  Affect sounded appropriate.  Thoughts lucid with no loosening of associations or evidence of psychosis.  Chronic suicidal ideation without any intent or plan.  Patient appeared alert and oriented short-term memory basically intact able to carry on a lucid conversation and able to understand the pros and cons of treatment.   Assessment and Plan: 36 year old woman with severe recurrent major depression without psychotic features unresponsive to multiple medicines and transcranial magnetic stimulation continues to be extremely impaired.  No medical issues that would be a contraindication to ECT.  Patient is an appropriate candidate for ECT.  Described the procedure in general as well as our procedure at the hospital.  Patient had several very appropriate questions to ask.  She is stating at this point that she would be agreeable and like to pursue ECT.  Referral will be made to the rest of the team to begin workup.   Follow Up Instructions:    I discussed the assessment and treatment plan with the patient. The patient was provided an opportunity to ask questions and all were answered. The patient agreed with the plan and demonstrated an understanding of the instructions.   The patient was advised to call back or seek an in-person evaluation  if the symptoms worsen or if the condition fails to improve as anticipated.  I provided 45 minutes of non-face-to-face time during this encounter.   [name], MD

## 2021-07-30 ENCOUNTER — Telehealth: Payer: Self-pay

## 2021-08-03 ENCOUNTER — Other Ambulatory Visit: Payer: 59

## 2021-08-03 ENCOUNTER — Inpatient Hospital Stay
Admission: RE | Admit: 2021-08-03 | Discharge: 2021-08-03 | Disposition: A | Discharge status: Home / Self Care | Payer: 59 | Source: Ambulatory Visit

## 2021-08-04 ENCOUNTER — Encounter
Admission: RE | Admit: 2021-08-04 | Discharge: 2021-08-04 | Disposition: A | Discharge status: Home / Self Care | Payer: BC Managed Care – PPO | Source: Ambulatory Visit | Attending: Psychiatry | Admitting: Psychiatry

## 2021-08-04 ENCOUNTER — Other Ambulatory Visit: Payer: Self-pay | Admitting: Psychiatry

## 2021-08-04 ENCOUNTER — Ambulatory Visit
Admission: RE | Admit: 2021-08-04 | Discharge: 2021-08-04 | Disposition: A | Discharge status: Home / Self Care | Payer: BC Managed Care – PPO | Source: Ambulatory Visit | Attending: Psychiatry | Admitting: Psychiatry

## 2021-08-04 DIAGNOSIS — R200 Anesthesia of skin: Secondary | ICD-10-CM

## 2021-08-04 DIAGNOSIS — Z01818 Encounter for other preprocedural examination: Secondary | ICD-10-CM | POA: Insufficient documentation

## 2021-08-04 LAB — URINALYSIS, COMPLETE (UACMP) WITH MICROSCOPIC
Bilirubin Urine: NEGATIVE
Glucose, UA: NEGATIVE mg/dL
Ketones, ur: NEGATIVE mg/dL
Nitrite: NEGATIVE
Protein, ur: 30 mg/dL — AB
RBC / HPF: >50 RBC/hpf — ABNORMAL HIGH (ref 0–5)
Specific Gravity, Urine: 1.015 (ref 1.005–1.030)
pH: 8 (ref 5.0–8.0)

## 2021-08-04 LAB — BASIC METABOLIC PANEL
Anion gap: 6 (ref 5–15)
BUN: 13 mg/dL (ref 6–20)
CO2: 25 mmol/L (ref 22–32)
Calcium: 8.6 mg/dL — ABNORMAL LOW (ref 8.9–10.3)
Chloride: 108 mmol/L (ref 98–111)
Creatinine, Ser: 0.75 mg/dL (ref 0.44–1.00)
GFR, Estimated: >60 mL/min (ref >60)
Glucose, Bld: 98 mg/dL (ref 70–99)
Potassium: 3.9 mmol/L (ref 3.5–5.1)
Sodium: 139 mmol/L (ref 135–145)

## 2021-08-04 LAB — CBC
HCT: 42.4 % (ref 36.0–46.0)
Hemoglobin: 13.8 g/dL (ref 12.0–15.0)
MCH: 28.2 pg (ref 26.0–34.0)
MCHC: 32.5 g/dL (ref 30.0–36.0)
MCV: 86.5 fL (ref 80.0–100.0)
Platelets: 253 10*3/uL (ref 150–400)
RBC: 4.9 MIL/uL (ref 3.87–5.11)
RDW: 12.3 % (ref 11.5–15.5)
WBC: 9.1 10*3/uL (ref 4.0–10.5)
nRBC: 0 % (ref 0.0–0.2)

## 2021-08-04 NOTE — Pre-Procedure Instructions (Signed)
Met with patient to review the New patient instructions information, answered question patient had. Walked patient to the lobby of the [facility] to orient the patient as to where to check-in and the elevator that will be used to go to the second floor for her appointment. Patient was given appointment times and dates. Patient verbalized understanding of the education that was provided and knows the number to the ECT if she has any questions.

## 2021-08-15 ENCOUNTER — Other Ambulatory Visit: Payer: Self-pay | Admitting: Psychiatry

## 2021-08-15 DIAGNOSIS — R200 Anesthesia of skin: Secondary | ICD-10-CM

## 2021-08-16 ENCOUNTER — Ambulatory Visit
Admission: RE | Admit: 2021-08-16 | Discharge: 2021-08-16 | Disposition: A | Discharge status: Home / Self Care | Payer: BC Managed Care – PPO | Source: Ambulatory Visit | Attending: Physician Assistant | Admitting: Physician Assistant

## 2021-08-16 ENCOUNTER — Encounter: Payer: Self-pay | Admitting: Certified Registered Nurse Anesthetist

## 2021-08-16 DIAGNOSIS — R200 Anesthesia of skin: Secondary | ICD-10-CM | POA: Insufficient documentation

## 2021-08-16 DIAGNOSIS — F419 Anxiety disorder, unspecified: Secondary | ICD-10-CM | POA: Diagnosis not present

## 2021-08-16 DIAGNOSIS — J45909 Unspecified asthma, uncomplicated: Secondary | ICD-10-CM | POA: Diagnosis not present

## 2021-08-16 DIAGNOSIS — F332 Major depressive disorder, recurrent severe without psychotic features: Secondary | ICD-10-CM | POA: Diagnosis not present

## 2021-08-16 DIAGNOSIS — F32A Depression, unspecified: Secondary | ICD-10-CM | POA: Diagnosis not present

## 2021-08-16 DIAGNOSIS — Z6841 Body Mass Index (BMI) 40.0 and over, adult: Secondary | ICD-10-CM | POA: Diagnosis not present

## 2021-08-16 DIAGNOSIS — F418 Other specified anxiety disorders: Secondary | ICD-10-CM | POA: Diagnosis not present

## 2021-08-16 MED ORDER — GLYCOPYRROLATE 0.2 MG/ML IJ SOLN
0.1000 mg | Freq: Once | INTRAMUSCULAR | Status: AC
  Administered 2021-08-16: 0.1 mg via INTRAVENOUS

## 2021-08-16 MED ORDER — SUCCINYLCHOLINE CHLORIDE 200 MG/10ML IV SOSY
PREFILLED_SYRINGE | INTRAVENOUS | Status: DC | PRN
  Administered 2021-08-16: 120 mg via INTRAVENOUS

## 2021-08-16 MED ORDER — GLYCOPYRROLATE 0.2 MG/ML IJ SOLN
INTRAMUSCULAR | Status: AC
  Filled 2021-08-16: qty 1

## 2021-08-16 MED ORDER — METHOHEXITAL SODIUM 100 MG/10ML IV SOSY
PREFILLED_SYRINGE | INTRAVENOUS | Status: DC | PRN
  Administered 2021-08-16: 120 mg via INTRAVENOUS

## 2021-08-16 MED ORDER — LABETALOL HCL 5 MG/ML IV SOLN
INTRAVENOUS | Status: DC | PRN
  Administered 2021-08-16: 10 mg via INTRAVENOUS

## 2021-08-16 MED ORDER — SODIUM CHLORIDE 0.9 % IV SOLN: 500.0000 mL | Freq: Once | INTRAVENOUS | Status: DC

## 2021-08-16 MED ORDER — SUCCINYLCHOLINE CHLORIDE 200 MG/10ML IV SOSY
PREFILLED_SYRINGE | INTRAVENOUS | Status: AC
  Filled 2021-08-16: qty 10

## 2021-08-16 MED ORDER — SODIUM CHLORIDE 0.9 % IV SOLN: INTRAVENOUS | Status: DC | PRN

## 2021-08-16 NOTE — Anesthesia Procedure Notes (Signed)
Date/Time: 08/16/2021 12:30 PM  Performed by: Johnna Acosta, CRNAPre-anesthesia Checklist: Patient identified, Emergency Drugs available, Suction available, Patient being monitored and Timeout performed Patient Re-evaluated:Patient Re-evaluated prior to induction Oxygen Delivery Method: Circle system utilized Preoxygenation: Pre-oxygenation with 100% oxygen Induction Type: IV induction Ventilation: Mask ventilation without difficulty

## 2021-08-16 NOTE — Procedures (Signed)
ECT SERVICES Physician's Interval Evaluation & Treatment Note  Patient Identification: Summer Shields MRN:  [id number] Date of Evaluation:  08/16/2021 TX #: 1  MADRS:   MMSE:   P.E. Findings:  Unremarkable normal physical exam  Psychiatric Interval Note:  Depressed dysphoric  Subjective:  Patient is a 36 y.o. female seen for evaluation for Electroconvulsive Therapy. Subjective depression  Treatment Summary:   '[x]'$   Right Unilateral             '[]'$  Bilateral   % Energy : 0.3 ms 40%   Impedance: 2000 ohms  Seizure Energy Index: 24,473 V squared  Postictal Suppression Index: 86%  Seizure Concordance Index: 98%  Medications  Pre Shock: Brevital 120 mg succinylcholine 120 mg  Post Shock:    Seizure Duration: 31 seconds EMG 42 seconds EEG   Comments: Next treatment Wednesday  Lungs:  '[x]'$   Clear to auscultation               '[]'$  Other:   Heart:    '[x]'$   Regular rhythm             '[]'$  irregular rhythm    '[x]'$   Previous H&P reviewed, patient examined and there are NO CHANGES                 '[]'$   Previous H&P reviewed, patient examined and there are changes noted.   [name], MD 8/7/20236:31 PM

## 2021-08-16 NOTE — Transfer of Care (Signed)
Immediate Anesthesia Transfer of Care Note  Patient: Summer Shields  Procedure(s) Performed: ECT TX  Patient Location: PACU  Anesthesia Type:General  Level of Consciousness: sedated  Airway & Oxygen Therapy: Patient Spontanous Breathing and Patient connected to face mask oxygen  Post-op Assessment: Report given to RN and Post -op Vital signs reviewed and stable  Post vital signs: Reviewed and stable  Last Vitals:  Vitals Value Taken Time  BP 103/64 08/16/21 1250  Temp    Pulse 88 08/16/21 1250  Resp 16 08/16/21 1250  SpO2 97 % 08/16/21 1250    Last Pain:  Vitals:   08/16/21 1207  TempSrc: Oral         Complications: No notable events documented.

## 2021-08-16 NOTE — Anesthesia Preprocedure Evaluation (Signed)
Anesthesia Evaluation  Patient identified by MRN, date of birth, ID band Patient awake    Reviewed: Allergy & Precautions, NPO status , Patient's Chart, lab work & pertinent test results  History of Anesthesia Complications Negative for: history of anesthetic complications  Airway Mallampati: IV  TM Distance: >3 FB Neck ROM: full    Dental  (+) Teeth Intact   Pulmonary asthma ,    Pulmonary exam normal        Cardiovascular (-) hypertension(-) angina(-) CAD and (-) Past MI negative cardio ROS Normal cardiovascular exam(-) Valvular Problems/Murmurs     Neuro/Psych PSYCHIATRIC DISORDERS negative neurological ROS     GI/Hepatic negative GI ROS, Neg liver ROS,   Endo/Other  negative endocrine ROS  Renal/GU negative Renal ROS  negative genitourinary   Musculoskeletal   Abdominal   Peds  Hematology negative hematology ROS (+)   Anesthesia Other Findings Past Medical History: No date: Allergy No date: Anxiety No date: Depression No date: Environmental allergies No date: Fibroadenoma No date: Headache No date: Plantar fasciitis  Past Surgical History: No date: BUNIONECTOMY; Bilateral     Comment:  04/09/2013 left, 06/30/2013 right,  No date: bunionectomy revision; Right     Comment:  08/2013 No date: FOOT SURGERY No date: open plantar fasciotomy; Right     Comment:  01/16/2014 No date: plantar topaz; Bilateral     Comment:  04/09/2013, 06/30/2013 No date: TONSILLECTOMY No date: WISDOM TOOTH EXTRACTION  BMI    Body Mass Index: 41.97 kg/m      Reproductive/Obstetrics negative OB ROS                             Anesthesia Physical Anesthesia Plan  ASA: 3  Anesthesia Plan: General   Post-op Pain Management: Minimal or no pain anticipated   Induction: Intravenous  PONV Risk Score and Plan: 3 and Propofol infusion, TIVA and Ondansetron  Airway Management Planned:  Mask  Additional Equipment: None  Intra-op Plan:   Post-operative Plan:   Informed Consent: I have reviewed the patients History and Physical, chart, labs and discussed the procedure including the risks, benefits and alternatives for the proposed anesthesia with the patient or authorized representative who has indicated his/her understanding and acceptance.     Dental advisory given  Plan Discussed with: CRNA and Surgeon  Anesthesia Plan Comments: (Discussed risks of anesthesia with patient, including possibility of difficulty with spontaneous ventilation under anesthesia necessitating airway intervention, PONV, and rare risks such as cardiac or respiratory or neurological events, and allergic reactions. Discussed the role of CRNA in patient's perioperative care. Patient understands.)        Anesthesia Quick Evaluation

## 2021-08-16 NOTE — H&P (Signed)
Summer Shields is an 36 y.o. female.   Chief Complaint: Patient with recurrent severe depression and anxiety HPI: Recurrent depression with out full response to medication  Past Medical History:  Diagnosis Date   Allergy    Anxiety    Depression    Environmental allergies    Fibroadenoma    Headache    Plantar fasciitis     Past Surgical History:  Procedure Laterality Date   BUNIONECTOMY Bilateral    04/09/2013 left, 06/30/2013 right,    bunionectomy revision Right    08/2013   FOOT SURGERY     open plantar fasciotomy Right    01/16/2014   plantar topaz Bilateral    04/09/2013, 06/30/2013   TONSILLECTOMY     WISDOM TOOTH EXTRACTION      Family History  Problem Relation Age of Onset   Diabetes Maternal Grandfather    Hypertension Maternal Grandfather    Stroke Maternal Grandfather    Hyperlipidemia Maternal Grandfather    Alcohol abuse Maternal Grandfather    Depression Father    Cancer Father    ADD / ADHD Sister    Mental illness Brother        anxiety   Anxiety disorder Brother    Alcohol abuse Brother    Cancer Paternal Grandmother        skin   Cancer Mother    Social History:  reports that she has never smoked. She has never used smokeless tobacco. She reports that she does not drink alcohol and does not use drugs.  Allergies:  Allergies  Allergen Reactions   Lamictal [Lamotrigine] Rash   Codeine Nausea And Vomiting   Tramadol Nausea And Vomiting   Amoxicillin Rash    Has patient had a PCN reaction causing immediate rash, facial/tongue/throat swelling, SOB or lightheadedness with hypotension: Yes Has patient had a PCN reaction causing severe rash involving mucus membranes or skin necrosis: No Has patient had a PCN reaction that required hospitalization: No Has patient had a PCN reaction occurring within the last 10 years: No If all of the above answers are "NO", then may proceed with Cephalosporin use.     (Not in a hospital admission)   No results  found for this or any previous visit (from the past 48 hour(s)). No results found.  Review of Systems  Constitutional: Negative.   HENT: Negative.    Eyes: Negative.   Respiratory: Negative.    Cardiovascular: Negative.   Gastrointestinal: Negative.   Musculoskeletal: Negative.   Skin: Negative.   Neurological: Negative.   Psychiatric/Behavioral:  Positive for dysphoric mood.     Blood pressure 105/68, pulse 86, temperature (!) 97.1 F (36.2 C), temperature source Oral, resp. rate 16, height '5\' 6"'$  (1.676 m), weight 117.9 kg, SpO2 96 %. Physical Exam Vitals and nursing note reviewed.  Constitutional:      Appearance: She is well-developed.  HENT:     Head: Normocephalic and atraumatic.  Eyes:     Conjunctiva/sclera: Conjunctivae normal.     Pupils: Pupils are equal, round, and reactive to light.  Cardiovascular:     Heart sounds: Normal heart sounds.  Pulmonary:     Effort: Pulmonary effort is normal.  Abdominal:     Palpations: Abdomen is soft.  Musculoskeletal:        General: Normal range of motion.     Cervical back: Normal range of motion.  Skin:    General: Skin is warm and dry.  Neurological:  General: No focal deficit present.     Mental Status: She is alert.  Psychiatric:        Attention and Perception: Attention normal.        Mood and Affect: Mood is depressed.        Speech: Speech is delayed.        Behavior: Behavior is slowed.      Assessment/Plan Beginning index treatment with right unilateral treatment today  Summer Berthold, MD 08/16/2021, 6:16 PM

## 2021-08-16 NOTE — Progress Notes (Signed)
Education provided to the patient regarding the possibility of incontinence due to the administration of anesthesia. Recommendation was made for the patient to apply an incontinence pad to prevent soiling clothing. Patient verbalized understanding of the education and stated they choose not to apply and incontinence pad for this visit.

## 2021-08-17 ENCOUNTER — Other Ambulatory Visit: Payer: Self-pay | Admitting: Psychiatry

## 2021-08-17 DIAGNOSIS — R200 Anesthesia of skin: Secondary | ICD-10-CM

## 2021-08-17 NOTE — Anesthesia Postprocedure Evaluation (Signed)
Anesthesia Post Note  Patient: Summer Shields  Procedure(s) Performed: ECT TX  Patient location during evaluation: PACU Anesthesia Type: General Level of consciousness: awake and alert Pain management: pain level controlled Vital Signs Assessment: post-procedure vital signs reviewed and stable Respiratory status: spontaneous breathing, nonlabored ventilation, respiratory function stable and patient connected to nasal cannula oxygen Cardiovascular status: blood pressure returned to baseline and stable Postop Assessment: no apparent nausea or vomiting Anesthetic complications: no   No notable events documented.   Last Vitals:  Vitals:   08/16/21 1307 08/16/21 1541  BP:  105/68  Pulse: 88 86  Resp: 15 16  Temp:  (!) 36.2 C  SpO2: 96%     Last Pain:  Vitals:   08/16/21 1541  TempSrc: Oral  PainSc: 0-No pain                 Dimas Millin

## 2021-08-18 ENCOUNTER — Ambulatory Visit: Payer: Self-pay | Admitting: Registered Nurse

## 2021-08-18 ENCOUNTER — Encounter (INDEPENDENT_AMBULATORY_CARE_PROVIDER_SITE_OTHER): Payer: Self-pay

## 2021-08-18 ENCOUNTER — Encounter: Payer: Self-pay | Admitting: Registered Nurse

## 2021-08-18 ENCOUNTER — Encounter
Admission: RE | Admit: 2021-08-18 | Discharge: 2021-08-18 | Disposition: A | Discharge status: Home / Self Care | Payer: BC Managed Care – PPO | Source: Ambulatory Visit | Attending: Psychiatry | Admitting: Psychiatry

## 2021-08-18 DIAGNOSIS — Z6841 Body Mass Index (BMI) 40.0 and over, adult: Secondary | ICD-10-CM | POA: Diagnosis not present

## 2021-08-18 DIAGNOSIS — F339 Major depressive disorder, recurrent, unspecified: Secondary | ICD-10-CM | POA: Insufficient documentation

## 2021-08-18 DIAGNOSIS — R200 Anesthesia of skin: Secondary | ICD-10-CM

## 2021-08-18 DIAGNOSIS — F419 Anxiety disorder, unspecified: Secondary | ICD-10-CM | POA: Diagnosis not present

## 2021-08-18 DIAGNOSIS — F418 Other specified anxiety disorders: Secondary | ICD-10-CM | POA: Diagnosis not present

## 2021-08-18 DIAGNOSIS — J45909 Unspecified asthma, uncomplicated: Secondary | ICD-10-CM | POA: Diagnosis not present

## 2021-08-18 MED ORDER — LABETALOL HCL 5 MG/ML IV SOLN
INTRAVENOUS | Status: DC | PRN
  Administered 2021-08-18: 10 mg via INTRAVENOUS

## 2021-08-18 MED ORDER — ONDANSETRON HCL 4 MG/2ML IJ SOLN
4.0000 mg | Freq: Once | INTRAMUSCULAR | Status: AC
  Administered 2021-08-18: 4 mg via INTRAVENOUS

## 2021-08-18 MED ORDER — SODIUM CHLORIDE 0.9 % IV SOLN
500.0000 mL | Freq: Once | INTRAVENOUS | Status: AC
  Administered 2021-08-18: 500 mL via INTRAVENOUS

## 2021-08-18 MED ORDER — SUCCINYLCHOLINE CHLORIDE 200 MG/10ML IV SOSY
PREFILLED_SYRINGE | INTRAVENOUS | Status: DC | PRN
  Administered 2021-08-18: 120 mg via INTRAVENOUS

## 2021-08-18 MED ORDER — METHOHEXITAL SODIUM 100 MG/10ML IV SOSY
PREFILLED_SYRINGE | INTRAVENOUS | Status: DC | PRN
  Administered 2021-08-18: 120 mg via INTRAVENOUS

## 2021-08-18 MED ORDER — SODIUM CHLORIDE 0.9 % IV SOLN: INTRAVENOUS | Status: DC | PRN

## 2021-08-18 MED ORDER — GLYCOPYRROLATE 0.2 MG/ML IJ SOLN
0.1000 mg | Freq: Once | INTRAMUSCULAR | Status: AC
  Administered 2021-08-18: 0.1 mg via INTRAVENOUS

## 2021-08-18 NOTE — Anesthesia Preprocedure Evaluation (Signed)
Anesthesia Evaluation  Patient identified by MRN, date of birth, ID band Patient awake    Reviewed: Allergy & Precautions, NPO status , Patient's Chart, lab work & pertinent test results  History of Anesthesia Complications Negative for: history of anesthetic complications  Airway Mallampati: IV  TM Distance: >3 FB Neck ROM: full    Dental  (+) Chipped   Pulmonary asthma ,    Pulmonary exam normal        Cardiovascular (-) hypertension(-) angina(-) CAD and (-) Past MI negative cardio ROS Normal cardiovascular exam(-) Valvular Problems/Murmurs     Neuro/Psych PSYCHIATRIC DISORDERS negative neurological ROS     GI/Hepatic negative GI ROS, Neg liver ROS, neg GERD  ,  Endo/Other  negative endocrine ROS  Renal/GU negative Renal ROS  negative genitourinary   Musculoskeletal   Abdominal   Peds  Hematology negative hematology ROS (+)   Anesthesia Other Findings Past Medical History: No date: Allergy No date: Anxiety No date: Depression No date: Environmental allergies No date: Fibroadenoma No date: Headache No date: Plantar fasciitis  Past Surgical History: No date: BUNIONECTOMY; Bilateral     Comment:  04/09/2013 left, 06/30/2013 right,  No date: bunionectomy revision; Right     Comment:  08/2013 No date: FOOT SURGERY No date: open plantar fasciotomy; Right     Comment:  01/16/2014 No date: plantar topaz; Bilateral     Comment:  04/09/2013, 06/30/2013 No date: TONSILLECTOMY No date: WISDOM TOOTH EXTRACTION  BMI    Body Mass Index: 41.97 kg/m      Reproductive/Obstetrics negative OB ROS                             Anesthesia Physical  Anesthesia Plan  ASA: 3  Anesthesia Plan: General   Post-op Pain Management: Minimal or no pain anticipated   Induction: Intravenous  PONV Risk Score and Plan: 3 and Propofol infusion, TIVA and Ondansetron  Airway Management Planned:  Mask  Additional Equipment: None  Intra-op Plan:   Post-operative Plan:   Informed Consent: I have reviewed the patients History and Physical, chart, labs and discussed the procedure including the risks, benefits and alternatives for the proposed anesthesia with the patient or authorized representative who has indicated his/her understanding and acceptance.     Dental advisory given  Plan Discussed with: CRNA and Surgeon  Anesthesia Plan Comments: (Patient consented for risks of anesthesia including but not limited to:  - adverse reactions to medications - risk of airway placement if required - damage to eyes, teeth, lips or other oral mucosa - nerve damage due to positioning  - sore throat or hoarseness - Damage to heart, brain, nerves, lungs, other parts of body or loss of life  Patient voiced understanding.)        Anesthesia Quick Evaluation

## 2021-08-18 NOTE — Anesthesia Postprocedure Evaluation (Signed)
Anesthesia Post Note  Patient: Summer Shields  Procedure(s) Performed: ECT TX  Patient location during evaluation: PACU Anesthesia Type: General Level of consciousness: combative Pain management: pain level controlled Vital Signs Assessment: post-procedure vital signs reviewed and stable Respiratory status: spontaneous breathing, nonlabored ventilation, respiratory function stable and patient connected to nasal cannula oxygen Cardiovascular status: blood pressure returned to baseline and stable Postop Assessment: no apparent nausea or vomiting Anesthetic complications: no   No notable events documented.   Last Vitals:  Vitals:   08/18/21 1230 08/18/21 1255  BP: (!) 113/54 118/78  Pulse: 71 72  Resp: 20 16  Temp: 37.4 C 36.9 C  SpO2: 99%     Last Pain:  Vitals:   08/18/21 1255  TempSrc: Oral  PainSc: 0-No pain                 Precious Haws Jermarion Poffenberger

## 2021-08-18 NOTE — Transfer of Care (Signed)
Immediate Anesthesia Transfer of Care Note  Patient: Summer Shields  Procedure(s) Performed: ECT TX  Patient Location: PACU  Anesthesia Type:General  Level of Consciousness: drowsy  Airway & Oxygen Therapy: Patient Spontanous Breathing  Post-op Assessment: Report given to RN and Post -op Vital signs reviewed and stable  Post vital signs: Reviewed and stable  Last Vitals:  Vitals Value Taken Time  BP 120/65 08/18/21 1211  Temp    Pulse 75 08/18/21 1212  Resp 25 08/18/21 1212  SpO2 98 % 08/18/21 1212  Vitals shown include unvalidated device data.  Last Pain:  Vitals:   08/18/21 1152  TempSrc:   PainSc: 0-No pain         Complications: No notable events documented.

## 2021-08-18 NOTE — Progress Notes (Signed)
Education provided to the patient regarding the possibility of incontinence due to the administration of anesthesia. Recommendation was made for the patient to apply an incontinence pad to prevent soiling clothing. Patient verbalized understanding of the education and stated they choose not to apply and incontinence pad for this visit.

## 2021-08-19 ENCOUNTER — Other Ambulatory Visit: Payer: Self-pay | Admitting: Psychiatry

## 2021-08-19 DIAGNOSIS — R200 Anesthesia of skin: Secondary | ICD-10-CM

## 2021-08-20 ENCOUNTER — Ambulatory Visit: Payer: Self-pay | Admitting: Certified Registered"

## 2021-08-20 ENCOUNTER — Encounter: Payer: Self-pay | Admitting: Certified Registered"

## 2021-08-20 ENCOUNTER — Ambulatory Visit
Admission: RE | Admit: 2021-08-20 | Discharge: 2021-08-20 | Disposition: A | Discharge status: Home / Self Care | Payer: BC Managed Care – PPO | Source: Ambulatory Visit | Attending: Psychiatry | Admitting: Psychiatry

## 2021-08-20 DIAGNOSIS — F39 Unspecified mood [affective] disorder: Secondary | ICD-10-CM | POA: Insufficient documentation

## 2021-08-20 DIAGNOSIS — R5383 Other fatigue: Secondary | ICD-10-CM | POA: Insufficient documentation

## 2021-08-20 DIAGNOSIS — F332 Major depressive disorder, recurrent severe without psychotic features: Secondary | ICD-10-CM | POA: Diagnosis not present

## 2021-08-20 DIAGNOSIS — R200 Anesthesia of skin: Secondary | ICD-10-CM

## 2021-08-20 DIAGNOSIS — F418 Other specified anxiety disorders: Secondary | ICD-10-CM | POA: Diagnosis not present

## 2021-08-20 DIAGNOSIS — F419 Anxiety disorder, unspecified: Secondary | ICD-10-CM | POA: Diagnosis not present

## 2021-08-20 DIAGNOSIS — F329 Major depressive disorder, single episode, unspecified: Secondary | ICD-10-CM | POA: Diagnosis not present

## 2021-08-20 DIAGNOSIS — Z6841 Body Mass Index (BMI) 40.0 and over, adult: Secondary | ICD-10-CM | POA: Diagnosis not present

## 2021-08-20 MED ORDER — LABETALOL HCL 5 MG/ML IV SOLN
INTRAVENOUS | Status: AC
  Filled 2021-08-20: qty 4

## 2021-08-20 MED ORDER — SODIUM CHLORIDE 0.9 % IV SOLN
500.0000 mL | Freq: Once | INTRAVENOUS | Status: AC
  Administered 2021-08-20: 500 mL via INTRAVENOUS

## 2021-08-20 MED ORDER — METHOHEXITAL SODIUM 100 MG/10ML IV SOSY
PREFILLED_SYRINGE | INTRAVENOUS | Status: DC | PRN
  Administered 2021-08-20: 120 mg via INTRAVENOUS

## 2021-08-20 MED ORDER — KETOROLAC TROMETHAMINE 30 MG/ML IJ SOLN
30.0000 mg | Freq: Once | INTRAMUSCULAR | Status: AC
  Administered 2021-08-20: 30 mg via INTRAVENOUS

## 2021-08-20 MED ORDER — GLYCOPYRROLATE 0.2 MG/ML IJ SOLN
0.2000 mg | Freq: Once | INTRAMUSCULAR | Status: AC
  Administered 2021-08-20: 0.2 mg via INTRAVENOUS

## 2021-08-20 MED ORDER — ONDANSETRON HCL 4 MG/5ML PO SOLN
4.0000 mg | Freq: Once | ORAL | Status: DC
Start: 2021-08-20 — End: 2021-08-20
  Filled 2021-08-20: qty 5

## 2021-08-20 MED ORDER — ONDANSETRON HCL 4 MG/2ML IJ SOLN
INTRAMUSCULAR | Status: AC
  Filled 2021-08-20: qty 2

## 2021-08-20 MED ORDER — LABETALOL HCL 5 MG/ML IV SOLN
INTRAVENOUS | Status: DC | PRN
  Administered 2021-08-20: 10 mg via INTRAVENOUS

## 2021-08-20 MED ORDER — SODIUM CHLORIDE 0.9 % IV SOLN: INTRAVENOUS | Status: DC | PRN

## 2021-08-20 MED ORDER — SUCCINYLCHOLINE CHLORIDE 200 MG/10ML IV SOSY
PREFILLED_SYRINGE | INTRAVENOUS | Status: DC | PRN
  Administered 2021-08-20: 120 mg via INTRAVENOUS

## 2021-08-20 MED ORDER — ONDANSETRON HCL 4 MG/2ML IJ SOLN
4.0000 mg | Freq: Once | INTRAMUSCULAR | Status: AC
  Administered 2021-08-20: 4 mg via INTRAVENOUS

## 2021-08-20 MED ORDER — KETOROLAC TROMETHAMINE 30 MG/ML IJ SOLN
INTRAMUSCULAR | Status: AC
  Filled 2021-08-20: qty 1

## 2021-08-20 MED ORDER — GLYCOPYRROLATE 0.2 MG/ML IJ SOLN
INTRAMUSCULAR | Status: AC
  Filled 2021-08-20: qty 1

## 2021-08-20 NOTE — Procedures (Signed)
[facility] SERVICES Physician's Interval Evaluation & Treatment Note  Patient Identification: Summer Shields MRN:  [id number] Date of Evaluation:  08/20/2021 TX #: 3  MADRS:   MMSE:   P.E. Findings:  No change to physical exam  Psychiatric Interval Note:  Mood about the same dysphoric and anxious  Subjective:  Patient is a 36 y.o. female seen for evaluation for Electroconvulsive Therapy. No complaints that are different than previously except for some fatigue  Treatment Summary:   '[x]'$   Right Unilateral             '[]'$  Bilateral   % Energy : 0.3 ms 40%   Impedance: 1590 ohms  Seizure Energy Index: 43,538 V squared  Postictal Suppression Index: 90%  Seizure Concordance Index: 99%  Medications  Pre Shock: Robinul 0.1 mg Zofran 4 mg Brevital 120 mg succinylcholine 120 mg  Post Shock: Labetalol 10 mg  Seizure Duration: 19 seconds EMG 27 seconds EEG   Comments: Follow-up into next week  Lungs:  '[x]'$   Clear to auscultation               '[]'$  Other:   Heart:    '[x]'$   Regular rhythm             '[]'$  irregular rhythm    '[x]'$   Previous H&P reviewed, patient examined and there are NO CHANGES                 '[]'$   Previous H&P reviewed, patient examined and there are changes noted.   [name], MD 8/11/20234:55 PM

## 2021-08-20 NOTE — Transfer of Care (Signed)
Immediate Anesthesia Transfer of Care Note  Patient: Summer Shields  Procedure(s) Performed: ECT TX  Patient Location: PACU  Anesthesia Type:General  Level of Consciousness: awake and alert   Airway & Oxygen Therapy: Patient Spontanous Breathing and Patient connected to nasal cannula oxygen  Post-op Assessment: Report given to RN and Post -op Vital signs reviewed and stable  Post vital signs: Reviewed and stable  Last Vitals:  Vitals Value Taken Time  BP 105/72 08/20/21 1230  Temp 35.9 C 08/20/21 1225  Pulse 87 08/20/21 1233  Resp 18 08/20/21 1233  SpO2 99 % 08/20/21 1233  Vitals shown include unvalidated device data.  Last Pain:  Vitals:   08/20/21 1225  TempSrc:   PainSc: Asleep         Complications: No notable events documented.

## 2021-08-20 NOTE — H&P (Signed)
Summer Shields is an 36 y.o. female.   Chief Complaint: Patient continues to report dysphoric mood and anxiety but no suicidal ideation and no psychosis.  Some fatigue in between treatments no other serious side effects HPI: History of recurrent severe depression  Past Medical History:  Diagnosis Date   Allergy    Anxiety    Depression    Environmental allergies    Fibroadenoma    Headache    Plantar fasciitis     Past Surgical History:  Procedure Laterality Date   BUNIONECTOMY Bilateral    04/09/2013 left, 06/30/2013 right,    bunionectomy revision Right    08/2013   FOOT SURGERY     open plantar fasciotomy Right    01/16/2014   plantar topaz Bilateral    04/09/2013, 06/30/2013   TONSILLECTOMY     WISDOM TOOTH EXTRACTION      Family History  Problem Relation Age of Onset   Diabetes Maternal Grandfather    Hypertension Maternal Grandfather    Stroke Maternal Grandfather    Hyperlipidemia Maternal Grandfather    Alcohol abuse Maternal Grandfather    Depression Father    Cancer Father    ADD / ADHD Sister    Mental illness Brother        anxiety   Anxiety disorder Brother    Alcohol abuse Brother    Cancer Paternal Grandmother        skin   Cancer Mother    Social History:  reports that she has never smoked. She has never used smokeless tobacco. She reports that she does not drink alcohol and does not use drugs.  Allergies:  Allergies  Allergen Reactions   Lamictal [Lamotrigine] Rash   Codeine Nausea And Vomiting   Tramadol Nausea And Vomiting   Amoxicillin Rash    Has patient had a PCN reaction causing immediate rash, facial/tongue/throat swelling, SOB or lightheadedness with hypotension: Yes Has patient had a PCN reaction causing severe rash involving mucus membranes or skin necrosis: No Has patient had a PCN reaction that required hospitalization: No Has patient had a PCN reaction occurring within the last 10 years: No If all of the above answers are "NO",  then may proceed with Cephalosporin use.     (Not in a hospital admission)   No results found for this or any previous visit (from the past 48 hour(s)). No results found.  Review of Systems  Constitutional: Negative.   HENT: Negative.    Eyes: Negative.   Respiratory: Negative.    Cardiovascular: Negative.   Gastrointestinal: Negative.   Musculoskeletal: Negative.   Skin: Negative.   Neurological: Negative.   Psychiatric/Behavioral:  Positive for dysphoric mood.     Blood pressure 108/68, pulse 78, temperature (!) 97.5 F (36.4 C), temperature source Oral, resp. rate 17, height '5\' 6"'$  (1.676 m), weight 117.9 kg, SpO2 95 %. Physical Exam Vitals and nursing note reviewed.  Constitutional:      Appearance: She is well-developed.  HENT:     Head: Normocephalic and atraumatic.  Eyes:     Conjunctiva/sclera: Conjunctivae normal.     Pupils: Pupils are equal, round, and reactive to light.  Cardiovascular:     Heart sounds: Normal heart sounds.  Pulmonary:     Effort: Pulmonary effort is normal.  Abdominal:     Palpations: Abdomen is soft.  Musculoskeletal:        General: Normal range of motion.     Cervical back: Normal range of motion.  Skin:    General: Skin is warm and dry.  Neurological:     Mental Status: She is alert.  Psychiatric:        Attention and Perception: Attention normal.        Mood and Affect: Mood is depressed.        Speech: Speech is delayed.      Assessment/Plan Continue ECT today's treatment #3 we will see her back for all of next week  [name], MD 08/20/2021, 4:50 PM

## 2021-08-20 NOTE — Anesthesia Preprocedure Evaluation (Signed)
Anesthesia Evaluation  Patient identified by MRN, date of birth, ID band Patient awake    Reviewed: Allergy & Precautions, NPO status , Patient's Chart, lab work & pertinent test results  History of Anesthesia Complications Negative for: history of anesthetic complications  Airway Mallampati: IV  TM Distance: >3 FB Neck ROM: full    Dental  (+) Chipped   Pulmonary asthma ,    Pulmonary exam normal        Cardiovascular (-) hypertension(-) angina(-) CAD and (-) Past MI negative cardio ROS Normal cardiovascular exam(-) Valvular Problems/Murmurs     Neuro/Psych PSYCHIATRIC DISORDERS negative neurological ROS     GI/Hepatic negative GI ROS, Neg liver ROS, neg GERD  ,  Endo/Other  negative endocrine ROS  Renal/GU negative Renal ROS  negative genitourinary   Musculoskeletal   Abdominal   Peds  Hematology negative hematology ROS (+)   Anesthesia Other Findings Past Medical History: No date: Allergy No date: Anxiety No date: Depression No date: Environmental allergies No date: Fibroadenoma No date: Headache No date: Plantar fasciitis  Past Surgical History: No date: BUNIONECTOMY; Bilateral     Comment:  04/09/2013 left, 06/30/2013 right,  No date: bunionectomy revision; Right     Comment:  08/2013 No date: FOOT SURGERY No date: open plantar fasciotomy; Right     Comment:  01/16/2014 No date: plantar topaz; Bilateral     Comment:  04/09/2013, 06/30/2013 No date: TONSILLECTOMY No date: WISDOM TOOTH EXTRACTION  BMI    Body Mass Index: 41.97 kg/m      Reproductive/Obstetrics negative OB ROS                             Anesthesia Physical  Anesthesia Plan  ASA: 3  Anesthesia Plan: General   Post-op Pain Management: Minimal or no pain anticipated   Induction: Intravenous  PONV Risk Score and Plan: 3 and Propofol infusion, TIVA and Ondansetron  Airway Management Planned:  Mask  Additional Equipment: None  Intra-op Plan:   Post-operative Plan:   Informed Consent: I have reviewed the patients History and Physical, chart, labs and discussed the procedure including the risks, benefits and alternatives for the proposed anesthesia with the patient or authorized representative who has indicated his/her understanding and acceptance.     Dental advisory given  Plan Discussed with: CRNA and Surgeon  Anesthesia Plan Comments: (Patient consented for risks of anesthesia including but not limited to:  - adverse reactions to medications - risk of airway placement if required - damage to eyes, teeth, lips or other oral mucosa - nerve damage due to positioning  - sore throat or hoarseness - Damage to heart, brain, nerves, lungs, other parts of body or loss of life  Patient voiced understanding.)        Anesthesia Quick Evaluation

## 2021-08-21 NOTE — Anesthesia Postprocedure Evaluation (Signed)
Anesthesia Post Note  Patient: Summer Shields  Procedure(s) Performed: ECT TX  Patient location during evaluation: Endoscopy Anesthesia Type: General Level of consciousness: awake and alert Pain management: pain level controlled Vital Signs Assessment: post-procedure vital signs reviewed and stable Respiratory status: spontaneous breathing, nonlabored ventilation, respiratory function stable and patient connected to nasal cannula oxygen Cardiovascular status: blood pressure returned to baseline and stable Postop Assessment: no apparent nausea or vomiting Anesthetic complications: no   No notable events documented.   Last Vitals:  Vitals:   08/20/21 1259 08/20/21 1315  BP:  108/68  Pulse:  78  Resp: 16 17  Temp: (!) 36.4 C (!) 36.4 C  SpO2:      Last Pain:  Vitals:   08/20/21 1315  TempSrc: Oral  PainSc: 0-No pain                 Dimas Millin

## 2021-08-23 ENCOUNTER — Encounter (HOSPITAL_BASED_OUTPATIENT_CLINIC_OR_DEPARTMENT_OTHER)
Admission: RE | Admit: 2021-08-23 | Discharge: 2021-08-23 | Disposition: A | Discharge status: Home / Self Care | Payer: BC Managed Care – PPO | Source: Ambulatory Visit | Attending: Physician Assistant | Admitting: Physician Assistant

## 2021-08-23 ENCOUNTER — Other Ambulatory Visit: Payer: Self-pay | Admitting: Psychiatry

## 2021-08-23 ENCOUNTER — Encounter: Payer: Self-pay | Admitting: Certified Registered Nurse Anesthetist

## 2021-08-23 DIAGNOSIS — G8929 Other chronic pain: Secondary | ICD-10-CM | POA: Diagnosis not present

## 2021-08-23 DIAGNOSIS — F419 Anxiety disorder, unspecified: Secondary | ICD-10-CM | POA: Diagnosis not present

## 2021-08-23 DIAGNOSIS — F339 Major depressive disorder, recurrent, unspecified: Secondary | ICD-10-CM | POA: Diagnosis not present

## 2021-08-23 DIAGNOSIS — F332 Major depressive disorder, recurrent severe without psychotic features: Secondary | ICD-10-CM | POA: Diagnosis not present

## 2021-08-23 DIAGNOSIS — T7840XA Allergy, unspecified, initial encounter: Secondary | ICD-10-CM | POA: Diagnosis not present

## 2021-08-23 DIAGNOSIS — R200 Anesthesia of skin: Secondary | ICD-10-CM

## 2021-08-23 DIAGNOSIS — Z6841 Body Mass Index (BMI) 40.0 and over, adult: Secondary | ICD-10-CM | POA: Diagnosis not present

## 2021-08-23 LAB — POCT PREGNANCY, URINE: Preg Test, Ur: NEGATIVE

## 2021-08-23 MED ORDER — GLYCOPYRROLATE 0.2 MG/ML IJ SOLN
0.2000 mg | Freq: Once | INTRAMUSCULAR | Status: AC
  Administered 2021-08-23: 0.2 mg via INTRAVENOUS

## 2021-08-23 MED ORDER — KETOROLAC TROMETHAMINE 30 MG/ML IJ SOLN
30.0000 mg | Freq: Once | INTRAMUSCULAR | Status: AC
  Administered 2021-08-23: 30 mg via INTRAVENOUS

## 2021-08-23 MED ORDER — KETOROLAC TROMETHAMINE 30 MG/ML IJ SOLN
INTRAMUSCULAR | Status: AC
  Filled 2021-08-23: qty 1

## 2021-08-23 MED ORDER — METHOHEXITAL SODIUM 100 MG/10ML IV SOSY
PREFILLED_SYRINGE | INTRAVENOUS | Status: DC | PRN
  Administered 2021-08-23: 120 mg via INTRAVENOUS

## 2021-08-23 MED ORDER — METHOHEXITAL SODIUM 0.5 G IJ SOLR
INTRAMUSCULAR | Status: AC
  Filled 2021-08-23: qty 500

## 2021-08-23 MED ORDER — GLYCOPYRROLATE 0.2 MG/ML IJ SOLN
INTRAMUSCULAR | Status: AC
  Filled 2021-08-23: qty 1

## 2021-08-23 MED ORDER — SUCCINYLCHOLINE CHLORIDE 200 MG/10ML IV SOSY
PREFILLED_SYRINGE | INTRAVENOUS | Status: DC | PRN
  Administered 2021-08-23: 120 mg via INTRAVENOUS

## 2021-08-23 MED ORDER — SODIUM CHLORIDE 0.9 % IV SOLN
500.0000 mL | Freq: Once | INTRAVENOUS | Status: AC
  Administered 2021-08-23: 500 mL via INTRAVENOUS

## 2021-08-23 MED ORDER — LABETALOL HCL 5 MG/ML IV SOLN
INTRAVENOUS | Status: DC | PRN
  Administered 2021-08-23: 10 mg via INTRAVENOUS

## 2021-08-23 MED ORDER — SODIUM CHLORIDE 0.9 % IV SOLN: INTRAVENOUS | Status: DC | PRN

## 2021-08-23 NOTE — Anesthesia Postprocedure Evaluation (Signed)
Anesthesia Post Note  Patient: Summer Shields  Procedure(s) Performed: ECT TX  Patient location during evaluation: PACU Anesthesia Type: General Level of consciousness: awake and alert Pain management: pain level controlled Vital Signs Assessment: post-procedure vital signs reviewed and stable Respiratory status: spontaneous breathing, nonlabored ventilation, respiratory function stable and patient connected to nasal cannula oxygen Cardiovascular status: blood pressure returned to baseline and stable Postop Assessment: no apparent nausea or vomiting Anesthetic complications: no   No notable events documented.   Last Vitals:  Vitals:   08/23/21 1230 08/23/21 1236  BP: 93/75 109/81  Pulse: 77 83  Resp: (!) 25 16  Temp:  (!) 36.3 C  SpO2: 96% 96%    Last Pain:  Vitals:   08/23/21 1236  TempSrc:   PainSc: 0-No pain                 Precious Haws Gilbert Narain

## 2021-08-23 NOTE — H&P (Signed)
Summer Shields is an 36 y.o. female.   Chief Complaint: Depression and anxiety mild possible improvement HPI: Severe recurrent depression  Past Medical History:  Diagnosis Date   Allergy    Anxiety    Depression    Environmental allergies    Fibroadenoma    Headache    Plantar fasciitis     Past Surgical History:  Procedure Laterality Date   BUNIONECTOMY Bilateral    04/09/2013 left, 06/30/2013 right,    bunionectomy revision Right    08/2013   FOOT SURGERY     open plantar fasciotomy Right    01/16/2014   plantar topaz Bilateral    04/09/2013, 06/30/2013   TONSILLECTOMY     WISDOM TOOTH EXTRACTION      Family History  Problem Relation Age of Onset   Diabetes Maternal Grandfather    Hypertension Maternal Grandfather    Stroke Maternal Grandfather    Hyperlipidemia Maternal Grandfather    Alcohol abuse Maternal Grandfather    Depression Father    Cancer Father    ADD / ADHD Sister    Mental illness Brother        anxiety   Anxiety disorder Brother    Alcohol abuse Brother    Cancer Paternal Grandmother        skin   Cancer Mother    Social History:  reports that she has never smoked. She has never used smokeless tobacco. She reports that she does not drink alcohol and does not use drugs.  Allergies:  Allergies  Allergen Reactions   Lamictal [Lamotrigine] Rash   Codeine Nausea And Vomiting   Tramadol Nausea And Vomiting   Amoxicillin Rash    Has patient had a PCN reaction causing immediate rash, facial/tongue/throat swelling, SOB or lightheadedness with hypotension: Yes Has patient had a PCN reaction causing severe rash involving mucus membranes or skin necrosis: No Has patient had a PCN reaction that required hospitalization: No Has patient had a PCN reaction occurring within the last 10 years: No If all of the above answers are "NO", then may proceed with Cephalosporin use.     (Not in a hospital admission)   Results for orders placed or performed during  the hospital encounter of 08/23/21 (from the past 48 hour(s))  Pregnancy, urine POC     Status: None   Collection Time: 08/23/21 10:42 AM  Result Value Ref Range   Preg Test, Ur NEGATIVE NEGATIVE    Comment:        THE SENSITIVITY OF THIS METHODOLOGY IS >24 mIU/mL    No results found.  Review of Systems  Constitutional: Negative.   HENT: Negative.    Eyes: Negative.   Respiratory: Negative.    Cardiovascular: Negative.   Gastrointestinal: Negative.   Musculoskeletal: Negative.   Skin: Negative.   Neurological: Negative.   Psychiatric/Behavioral:  Positive for dysphoric mood.     Blood pressure 110/80, pulse 80, temperature (!) 97.4 F (36.3 C), temperature source Oral, resp. rate 16, height '5\' 6"'$  (1.676 m), weight 117.9 kg, SpO2 96 %. Physical Exam Vitals and nursing note reviewed.  Constitutional:      Appearance: She is well-developed.  HENT:     Head: Normocephalic and atraumatic.  Eyes:     Conjunctiva/sclera: Conjunctivae normal.     Pupils: Pupils are equal, round, and reactive to light.  Cardiovascular:     Heart sounds: Normal heart sounds.  Pulmonary:     Effort: Pulmonary effort is normal.  Abdominal:  Palpations: Abdomen is soft.  Musculoskeletal:        General: Normal range of motion.     Cervical back: Normal range of motion.  Skin:    General: Skin is warm and dry.  Neurological:     General: No focal deficit present.     Mental Status: She is alert.  Psychiatric:        Attention and Perception: Attention normal.        Mood and Affect: Mood normal.        Speech: Speech normal.      Assessment/Plan Continue ECT next treatment Wednesday  [name], MD 08/23/2021, 5:25 PM

## 2021-08-23 NOTE — Anesthesia Procedure Notes (Signed)
Date/Time: 08/23/2021 11:55 AM  Performed by: Johnna Acosta, CRNAPre-anesthesia Checklist: Patient identified, Emergency Drugs available, Suction available, Patient being monitored and Timeout performed Patient Re-evaluated:Patient Re-evaluated prior to induction Oxygen Delivery Method: Circle system utilized Preoxygenation: Pre-oxygenation with 100% oxygen Induction Type: IV induction Ventilation: Mask ventilation without difficulty

## 2021-08-23 NOTE — Transfer of Care (Signed)
Immediate Anesthesia Transfer of Care Note  Patient: Summer Shields  Procedure(s) Performed: ECT TX  Patient Location: PACU  Anesthesia Type:General  Level of Consciousness: drowsy  Airway & Oxygen Therapy: Patient Spontanous Breathing and Patient connected to face mask oxygen  Post-op Assessment: Report given to RN and Post -op Vital signs reviewed and stable  Post vital signs: Reviewed and stable  Last Vitals:  Vitals Value Taken Time  BP 123/79 08/23/21 1214  Temp    Pulse 81 08/23/21 1214  Resp 28 08/23/21 1214  SpO2 100 % 08/23/21 1214    Last Pain:  Vitals:   08/23/21 1044  TempSrc:   PainSc: 0-No pain         Complications: No notable events documented.

## 2021-08-23 NOTE — Anesthesia Preprocedure Evaluation (Signed)
Anesthesia Evaluation  Patient identified by MRN, date of birth, ID band Patient awake    Reviewed: Allergy & Precautions, NPO status , Patient's Chart, lab work & pertinent test results  History of Anesthesia Complications Negative for: history of anesthetic complications  Airway Mallampati: IV  TM Distance: >3 FB Neck ROM: full    Dental  (+) Chipped   Pulmonary asthma ,    Pulmonary exam normal        Cardiovascular (-) hypertension(-) angina(-) CAD and (-) Past MI negative cardio ROS Normal cardiovascular exam(-) Valvular Problems/Murmurs     Neuro/Psych PSYCHIATRIC DISORDERS negative neurological ROS     GI/Hepatic negative GI ROS, Neg liver ROS, neg GERD  ,  Endo/Other  negative endocrine ROS  Renal/GU negative Renal ROS  negative genitourinary   Musculoskeletal   Abdominal   Peds  Hematology negative hematology ROS (+)   Anesthesia Other Findings Past Medical History: No date: Allergy No date: Anxiety No date: Depression No date: Environmental allergies No date: Fibroadenoma No date: Headache No date: Plantar fasciitis  Past Surgical History: No date: BUNIONECTOMY; Bilateral     Comment:  04/09/2013 left, 06/30/2013 right,  No date: bunionectomy revision; Right     Comment:  08/2013 No date: FOOT SURGERY No date: open plantar fasciotomy; Right     Comment:  01/16/2014 No date: plantar topaz; Bilateral     Comment:  04/09/2013, 06/30/2013 No date: TONSILLECTOMY No date: WISDOM TOOTH EXTRACTION  BMI    Body Mass Index: 41.97 kg/m      Reproductive/Obstetrics negative OB ROS                             Anesthesia Physical  Anesthesia Plan  ASA: 3  Anesthesia Plan: General   Post-op Pain Management: Minimal or no pain anticipated   Induction: Intravenous  PONV Risk Score and Plan: 3 and Propofol infusion, TIVA and Ondansetron  Airway Management Planned:  Mask  Additional Equipment: None  Intra-op Plan:   Post-operative Plan:   Informed Consent: I have reviewed the patients History and Physical, chart, labs and discussed the procedure including the risks, benefits and alternatives for the proposed anesthesia with the patient or authorized representative who has indicated his/her understanding and acceptance.     Dental advisory given  Plan Discussed with: CRNA and Surgeon  Anesthesia Plan Comments: (Patient consented for risks of anesthesia including but not limited to:  - adverse reactions to medications - risk of airway placement if required - damage to eyes, teeth, lips or other oral mucosa - nerve damage due to positioning  - sore throat or hoarseness - Damage to heart, brain, nerves, lungs, other parts of body or loss of life  Patient voiced understanding.)        Anesthesia Quick Evaluation

## 2021-08-23 NOTE — Procedures (Signed)
ECT SERVICES Physician's Interval Evaluation & Treatment Note  Patient Identification: Summer Shields MRN:  [id number] Date of Evaluation:  08/23/2021 TX #: 4  MADRS:   MMSE:   P.E. Findings:  No change to physical exam  Psychiatric Interval Note:  Affect euthymic mood slightly better  Subjective:  Patient is a 36 y.o. female seen for evaluation for Electroconvulsive Therapy. Some partial improvement  Treatment Summary:   '[x]'$   Right Unilateral             '[]'$  Bilateral   % Energy : 0.3 ms 40%   Impedance: 1720 ohms  Seizure Energy Index: 54,272 V squared  Postictal Suppression Index: 88%  Seizure Concordance Index: 98%  Medications  Pre Shock: Robinul 0.2 mg Toradol 30 mg Brevital 120 mg succinylcholine 120 mg  Post Shock:    Seizure Duration: 13 seconds EMG 23 seconds EEG   Comments: Increase energy next treatment  Lungs:  '[x]'$   Clear to auscultation               '[]'$  Other:   Heart:    '[x]'$   Regular rhythm             '[]'$  irregular rhythm    '[x]'$   Previous H&P reviewed, patient examined and there are NO CHANGES                 '[]'$   Previous H&P reviewed, patient examined and there are changes noted.   [name], MD 8/14/20235:41 PM

## 2021-08-25 ENCOUNTER — Encounter (HOSPITAL_BASED_OUTPATIENT_CLINIC_OR_DEPARTMENT_OTHER)
Admission: RE | Admit: 2021-08-25 | Discharge: 2021-08-25 | Disposition: A | Discharge status: Home / Self Care | Payer: BC Managed Care – PPO | Source: Ambulatory Visit | Attending: Psychiatry | Admitting: Psychiatry

## 2021-08-25 ENCOUNTER — Encounter: Payer: Self-pay | Admitting: Anesthesiology

## 2021-08-25 ENCOUNTER — Other Ambulatory Visit: Payer: Self-pay | Admitting: Psychiatry

## 2021-08-25 DIAGNOSIS — F332 Major depressive disorder, recurrent severe without psychotic features: Secondary | ICD-10-CM | POA: Diagnosis not present

## 2021-08-25 DIAGNOSIS — R200 Anesthesia of skin: Secondary | ICD-10-CM

## 2021-08-25 DIAGNOSIS — Z6841 Body Mass Index (BMI) 40.0 and over, adult: Secondary | ICD-10-CM | POA: Diagnosis not present

## 2021-08-25 DIAGNOSIS — F319 Bipolar disorder, unspecified: Secondary | ICD-10-CM | POA: Diagnosis not present

## 2021-08-25 DIAGNOSIS — F419 Anxiety disorder, unspecified: Secondary | ICD-10-CM | POA: Diagnosis not present

## 2021-08-25 DIAGNOSIS — F339 Major depressive disorder, recurrent, unspecified: Secondary | ICD-10-CM | POA: Diagnosis not present

## 2021-08-25 MED ORDER — ONDANSETRON HCL 4 MG/2ML IJ SOLN: 4.0000 mg | Freq: Once | INTRAMUSCULAR | Status: DC

## 2021-08-25 MED ORDER — SODIUM CHLORIDE 0.9 % IV SOLN
500.0000 mL | Freq: Once | INTRAVENOUS | Status: AC
  Administered 2021-08-25: 500 mL via INTRAVENOUS

## 2021-08-25 MED ORDER — GLYCOPYRROLATE 0.2 MG/ML IJ SOLN
INTRAMUSCULAR | Status: AC
  Filled 2021-08-25: qty 1

## 2021-08-25 MED ORDER — SODIUM CHLORIDE 0.9 % IV SOLN: INTRAVENOUS | Status: DC | PRN

## 2021-08-25 MED ORDER — SUCCINYLCHOLINE CHLORIDE 200 MG/10ML IV SOSY
PREFILLED_SYRINGE | INTRAVENOUS | Status: DC | PRN
  Administered 2021-08-25: 120 mg via INTRAVENOUS

## 2021-08-25 MED ORDER — LABETALOL HCL 5 MG/ML IV SOLN
INTRAVENOUS | Status: DC | PRN
  Administered 2021-08-25: 10 mg via INTRAVENOUS

## 2021-08-25 MED ORDER — KETOROLAC TROMETHAMINE 30 MG/ML IJ SOLN
INTRAMUSCULAR | Status: AC
  Filled 2021-08-25: qty 1

## 2021-08-25 MED ORDER — KETOROLAC TROMETHAMINE 30 MG/ML IJ SOLN
30.0000 mg | Freq: Once | INTRAMUSCULAR | Status: AC
  Administered 2021-08-25: 30 mg via INTRAVENOUS

## 2021-08-25 MED ORDER — GLYCOPYRROLATE 0.2 MG/ML IJ SOLN
0.2000 mg | Freq: Once | INTRAMUSCULAR | Status: AC
  Administered 2021-08-25: 0.2 mg via INTRAVENOUS

## 2021-08-25 MED ORDER — METHOHEXITAL SODIUM 100 MG/10ML IV SOSY
PREFILLED_SYRINGE | INTRAVENOUS | Status: DC | PRN
  Administered 2021-08-25: 120 mg via INTRAVENOUS

## 2021-08-25 NOTE — H&P (Signed)
Summer Shields is an 36 y.o. female.   Chief Complaint: No new complaints HPI: Recurrent depression  Past Medical History:  Diagnosis Date   Allergy    Anxiety    Depression    Environmental allergies    Fibroadenoma    Headache    Plantar fasciitis     Past Surgical History:  Procedure Laterality Date   BUNIONECTOMY Bilateral    04/09/2013 left, 06/30/2013 right,    bunionectomy revision Right    08/2013   FOOT SURGERY     open plantar fasciotomy Right    01/16/2014   plantar topaz Bilateral    04/09/2013, 06/30/2013   TONSILLECTOMY     WISDOM TOOTH EXTRACTION      Family History  Problem Relation Age of Onset   Diabetes Maternal Grandfather    Hypertension Maternal Grandfather    Stroke Maternal Grandfather    Hyperlipidemia Maternal Grandfather    Alcohol abuse Maternal Grandfather    Depression Father    Cancer Father    ADD / ADHD Sister    Mental illness Brother        anxiety   Anxiety disorder Brother    Alcohol abuse Brother    Cancer Paternal Grandmother        skin   Cancer Mother    Social History:  reports that she has never smoked. She has never used smokeless tobacco. She reports that she does not drink alcohol and does not use drugs.  Allergies:  Allergies  Allergen Reactions   Lamictal [Lamotrigine] Rash   Codeine Nausea And Vomiting   Tramadol Nausea And Vomiting   Amoxicillin Rash    Has patient had a PCN reaction causing immediate rash, facial/tongue/throat swelling, SOB or lightheadedness with hypotension: Yes Has patient had a PCN reaction causing severe rash involving mucus membranes or skin necrosis: No Has patient had a PCN reaction that required hospitalization: No Has patient had a PCN reaction occurring within the last 10 years: No If all of the above answers are "NO", then may proceed with Cephalosporin use.     (Not in a hospital admission)   No results found for this or any previous visit (from the past 48 hour(s)). No  results found.  Review of Systems  Constitutional: Negative.   HENT: Negative.    Eyes: Negative.   Respiratory: Negative.    Cardiovascular: Negative.   Gastrointestinal: Negative.   Musculoskeletal: Negative.   Skin: Negative.   Neurological: Negative.   Psychiatric/Behavioral: Negative.      Blood pressure 115/78, pulse 75, temperature (!) 97.4 F (36.3 C), temperature source Oral, resp. rate 19, height '5\' 6"'$  (1.676 m), weight 117.9 kg, SpO2 96 %. Physical Exam Vitals reviewed.  Constitutional:      Appearance: She is well-developed.  HENT:     Head: Normocephalic and atraumatic.  Eyes:     Conjunctiva/sclera: Conjunctivae normal.     Pupils: Pupils are equal, round, and reactive to light.  Cardiovascular:     Heart sounds: Normal heart sounds.  Pulmonary:     Effort: Pulmonary effort is normal.  Abdominal:     Palpations: Abdomen is soft.  Musculoskeletal:        General: Normal range of motion.     Cervical back: Normal range of motion.  Skin:    General: Skin is warm and dry.  Neurological:     General: No focal deficit present.     Mental Status: She is alert.  Psychiatric:  Mood and Affect: Mood normal.      Assessment/Plan Continuing index course of ECT next treatment Friday  [name], MD 08/25/2021, 4:40 PM

## 2021-08-25 NOTE — Anesthesia Postprocedure Evaluation (Signed)
Anesthesia Post Note  Patient: Summer Shields  Procedure(s) Performed: ECT TX  Patient location during evaluation: PACU Anesthesia Type: General Level of consciousness: awake and alert Pain management: pain level controlled Vital Signs Assessment: post-procedure vital signs reviewed and stable Respiratory status: spontaneous breathing, nonlabored ventilation and respiratory function stable Cardiovascular status: blood pressure returned to baseline and stable Postop Assessment: no apparent nausea or vomiting Anesthetic complications: no   No notable events documented.   Last Vitals:  Vitals:   08/25/21 1200 08/25/21 1321  BP: 114/78 115/78  Pulse: 78 75  Resp: 19 19  Temp:  (!) 36.3 C  SpO2: 96%     Last Pain:  Vitals:   08/25/21 1321  TempSrc: Oral  PainSc: 0-No pain                 Iran Ouch

## 2021-08-25 NOTE — Procedures (Signed)
ECT SERVICES Physician's Interval Evaluation & Treatment Note  Patient Identification: Summer Shields MRN:  [id number] Date of Evaluation:  08/25/2021 TX #: 5  MADRS:   MMSE:   P.E. Findings:  No change to physical exam  Psychiatric Interval Note:  Slightly improved  Subjective:  Patient is a 36 y.o. female seen for evaluation for Electroconvulsive Therapy. No subjective complaints  Treatment Summary:   '[x]'$   Right Unilateral             '[]'$  Bilateral   % Energy : 0.3 ms 60%   Impedance: 2430 ohms  Seizure Energy Index: 23,092 V squared  Postictal Suppression Index: 77%  Seizure Concordance Index: 98%  Medications  Pre Shock: Robinul 0.2 mg Zofran 4 mg Brevital 120 mg succinylcholine 120 mg labetalol 10 mg  Post Shock:    Seizure Duration: 20 seconds EMG 34 seconds EEG   Comments: Next treatment Friday  Lungs:  '[x]'$   Clear to auscultation               '[]'$  Other:   Heart:    '[x]'$   Regular rhythm             '[]'$  irregular rhythm    '[x]'$   Previous H&P reviewed, patient examined and there are NO CHANGES                 '[]'$   Previous H&P reviewed, patient examined and there are changes noted.   Summer Berthold, MD 8/16/20234:48 PM

## 2021-08-25 NOTE — Transfer of Care (Signed)
Immediate Anesthesia Transfer of Care Note  Patient: Summer Shields  Procedure(s) Performed: ECT TX  Patient Location: PACU  Anesthesia Type:General  Level of Consciousness: awake and alert   Airway & Oxygen Therapy: Patient Spontanous Breathing and Patient connected to face mask oxygen  Post-op Assessment: Report given to RN and Post -op Vital signs reviewed and stable  Post vital signs: Reviewed and stable  Last Vitals:  Vitals Value Taken Time  BP 118/69 08/25/21 1126  Temp    Pulse 82 08/25/21 1127  Resp 25 08/25/21 1127  SpO2 100 % 08/25/21 1127  Vitals shown include unvalidated device data.  Last Pain:  Vitals:   08/25/21 1029  TempSrc:   PainSc: 0-No pain         Complications: No notable events documented.

## 2021-08-25 NOTE — Anesthesia Procedure Notes (Signed)
Date/Time: 08/25/2021 11:10 AM  Performed by: Nelda Marseille, CRNAOxygen Delivery Method: Circle system utilized Ventilation: Mask ventilation throughout procedure

## 2021-08-25 NOTE — Anesthesia Preprocedure Evaluation (Signed)
Anesthesia Evaluation  Patient identified by MRN, date of birth, ID band Patient awake    Reviewed: Allergy & Precautions, NPO status , Patient's Chart, lab work & pertinent test results  History of Anesthesia Complications Negative for: history of anesthetic complications  Airway Mallampati: I  TM Distance: >3 FB Neck ROM: full    Dental no notable dental hx.    Pulmonary neg pulmonary ROS,    Pulmonary exam normal        Cardiovascular (-) hypertension(-) angina(-) CAD and (-) Past MI negative cardio ROS Normal cardiovascular exam(-) Valvular Problems/Murmurs     Neuro/Psych PSYCHIATRIC DISORDERS negative neurological ROS     GI/Hepatic negative GI ROS, Neg liver ROS, neg GERD  ,  Endo/Other  Morbid obesity  Renal/GU negative Renal ROS  negative genitourinary   Musculoskeletal   Abdominal Normal abdominal exam  (+)   Peds  Hematology negative hematology ROS (+)   Anesthesia Other Findings Past Medical History: No date: Allergy No date: Anxiety No date: Depression No date: Environmental allergies No date: Fibroadenoma No date: Headache No date: Plantar fasciitis  Past Surgical History: No date: BUNIONECTOMY; Bilateral     Comment:  04/09/2013 left, 06/30/2013 right,  No date: bunionectomy revision; Right     Comment:  08/2013 No date: FOOT SURGERY No date: open plantar fasciotomy; Right     Comment:  01/16/2014 No date: plantar topaz; Bilateral     Comment:  04/09/2013, 06/30/2013 No date: TONSILLECTOMY No date: WISDOM TOOTH EXTRACTION  BMI    Body Mass Index: 41.97 kg/m      Reproductive/Obstetrics negative OB ROS                             Anesthesia Physical  Anesthesia Plan  ASA: 3  Anesthesia Plan: General   Post-op Pain Management: Minimal or no pain anticipated   Induction: Intravenous  PONV Risk Score and Plan: 3 and TIVA and Treatment may vary due to age  or medical condition  Airway Management Planned: Mask  Additional Equipment: None  Intra-op Plan:   Post-operative Plan:   Informed Consent: I have reviewed the patients History and Physical, chart, labs and discussed the procedure including the risks, benefits and alternatives for the proposed anesthesia with the patient or authorized representative who has indicated his/her understanding and acceptance.     Dental advisory given  Plan Discussed with: CRNA and Surgeon  Anesthesia Plan Comments: (Patient consented for risks of anesthesia including but not limited to:  - adverse reactions to medications - risk of airway placement if required - damage to eyes, teeth, lips or other oral mucosa - nerve damage due to positioning  - sore throat or hoarseness - Damage to heart, brain, nerves, lungs, other parts of body or loss of life  Patient voiced understanding.)        Anesthesia Quick Evaluation

## 2021-08-26 ENCOUNTER — Other Ambulatory Visit: Payer: Self-pay | Admitting: Psychiatry

## 2021-08-26 DIAGNOSIS — F331 Major depressive disorder, recurrent, moderate: Secondary | ICD-10-CM | POA: Diagnosis not present

## 2021-08-26 DIAGNOSIS — R200 Anesthesia of skin: Secondary | ICD-10-CM

## 2021-08-27 ENCOUNTER — Ambulatory Visit: Payer: Self-pay | Admitting: Anesthesiology

## 2021-08-27 ENCOUNTER — Encounter (HOSPITAL_BASED_OUTPATIENT_CLINIC_OR_DEPARTMENT_OTHER)
Admission: RE | Admit: 2021-08-27 | Discharge: 2021-08-27 | Disposition: A | Discharge status: Home / Self Care | Payer: BC Managed Care – PPO | Source: Ambulatory Visit | Attending: Psychiatry | Admitting: Psychiatry

## 2021-08-27 ENCOUNTER — Encounter: Payer: Self-pay | Admitting: Anesthesiology

## 2021-08-27 DIAGNOSIS — F319 Bipolar disorder, unspecified: Secondary | ICD-10-CM | POA: Diagnosis not present

## 2021-08-27 DIAGNOSIS — R200 Anesthesia of skin: Secondary | ICD-10-CM

## 2021-08-27 DIAGNOSIS — F332 Major depressive disorder, recurrent severe without psychotic features: Secondary | ICD-10-CM

## 2021-08-27 DIAGNOSIS — F419 Anxiety disorder, unspecified: Secondary | ICD-10-CM | POA: Diagnosis not present

## 2021-08-27 DIAGNOSIS — F339 Major depressive disorder, recurrent, unspecified: Secondary | ICD-10-CM | POA: Diagnosis not present

## 2021-08-27 MED ORDER — KETOROLAC TROMETHAMINE 30 MG/ML IJ SOLN: 30.0000 mg | Freq: Once | INTRAMUSCULAR | Status: DC

## 2021-08-27 MED ORDER — GLYCOPYRROLATE 0.2 MG/ML IJ SOLN: 0.2000 mg | Freq: Once | INTRAMUSCULAR | Status: DC

## 2021-08-27 MED ORDER — METHOHEXITAL SODIUM 100 MG/10ML IV SOSY
PREFILLED_SYRINGE | INTRAVENOUS | Status: DC | PRN
  Administered 2021-08-27: 120 mg via INTRAVENOUS

## 2021-08-27 MED ORDER — LABETALOL HCL 5 MG/ML IV SOLN
INTRAVENOUS | Status: DC | PRN
  Administered 2021-08-27: 10 mg via INTRAVENOUS

## 2021-08-27 MED ORDER — SUCCINYLCHOLINE CHLORIDE 200 MG/10ML IV SOSY
PREFILLED_SYRINGE | INTRAVENOUS | Status: DC | PRN
  Administered 2021-08-27: 120 mg via INTRAVENOUS

## 2021-08-27 MED ORDER — SODIUM CHLORIDE 0.9 % IV SOLN
500.0000 mL | Freq: Once | INTRAVENOUS | Status: AC
Start: 2021-08-27 — End: 2021-08-27

## 2021-08-27 MED ORDER — ONDANSETRON HCL 4 MG/2ML IJ SOLN: 4.0000 mg | Freq: Once | INTRAMUSCULAR | Status: DC

## 2021-08-27 NOTE — H&P (Signed)
Summer Shields is an 36 y.o. female.   Chief Complaint: No specific complaint HPI: Mood improved on top of chronic depression  Past Medical History:  Diagnosis Date   Allergy    Anxiety    Depression    Environmental allergies    Fibroadenoma    Headache    Plantar fasciitis     Past Surgical History:  Procedure Laterality Date   BUNIONECTOMY Bilateral    04/09/2013 left, 06/30/2013 right,    bunionectomy revision Right    08/2013   FOOT SURGERY     open plantar fasciotomy Right    01/16/2014   plantar topaz Bilateral    04/09/2013, 06/30/2013   TONSILLECTOMY     WISDOM TOOTH EXTRACTION      Family History  Problem Relation Age of Onset   Diabetes Maternal Grandfather    Hypertension Maternal Grandfather    Stroke Maternal Grandfather    Hyperlipidemia Maternal Grandfather    Alcohol abuse Maternal Grandfather    Depression Father    Cancer Father    ADD / ADHD Sister    Mental illness Brother        anxiety   Anxiety disorder Brother    Alcohol abuse Brother    Cancer Paternal Grandmother        skin   Cancer Mother    Social History:  reports that she has never smoked. She has never used smokeless tobacco. She reports that she does not drink alcohol and does not use drugs.  Allergies:  Allergies  Allergen Reactions   Lamictal [Lamotrigine] Rash   Codeine Nausea And Vomiting   Tramadol Nausea And Vomiting   Amoxicillin Rash    Has patient had a PCN reaction causing immediate rash, facial/tongue/throat swelling, SOB or lightheadedness with hypotension: Yes Has patient had a PCN reaction causing severe rash involving mucus membranes or skin necrosis: No Has patient had a PCN reaction that required hospitalization: No Has patient had a PCN reaction occurring within the last 10 years: No If all of the above answers are "NO", then may proceed with Cephalosporin use.     (Not in a hospital admission)   No results found for this or any previous visit (from the  past 48 hour(s)). No results found.  Review of Systems  Constitutional: Negative.   HENT: Negative.    Eyes: Negative.   Respiratory: Negative.    Cardiovascular: Negative.   Gastrointestinal: Negative.   Musculoskeletal: Negative.   Skin: Negative.   Neurological: Negative.   Psychiatric/Behavioral: Negative.      Blood pressure 102/65, pulse 82, temperature (!) 97.3 F (36.3 C), temperature source Oral, resp. rate (!) 24, height '5\' 6"'$  (1.676 m), weight 117.9 kg, SpO2 98 %. Physical Exam Vitals reviewed.  Constitutional:      Appearance: She is well-developed.  HENT:     Head: Normocephalic and atraumatic.  Eyes:     Conjunctiva/sclera: Conjunctivae normal.     Pupils: Pupils are equal, round, and reactive to light.  Cardiovascular:     Heart sounds: Normal heart sounds.  Pulmonary:     Effort: Pulmonary effort is normal.  Abdominal:     Palpations: Abdomen is soft.  Musculoskeletal:        General: Normal range of motion.     Cervical back: Normal range of motion.  Skin:    General: Skin is warm and dry.  Neurological:     General: No focal deficit present.     Mental  Status: She is alert.  Psychiatric:        Mood and Affect: Mood normal.      Assessment/Plan Recommend treatment #7 on Monday and then reassess for possible end of index course  [name], MD 08/27/2021, 4:16 PM

## 2021-08-27 NOTE — Transfer of Care (Signed)
Immediate Anesthesia Transfer of Care Note  Patient: Summer Shields  Procedure(s) Performed: ECT TX  Patient Location: PACU  Anesthesia Type:General  Level of Consciousness: drowsy and patient cooperative  Airway & Oxygen Therapy: Patient Spontanous Breathing  Post-op Assessment: Report given to RN and Post -op Vital signs reviewed and stable  Post vital signs: Reviewed and stable  Last Vitals:  Vitals Value Taken Time  BP 111/64 08/27/21 1238  Temp 36.2 C 08/27/21 1234  Pulse 79 08/27/21 1238  Resp 14 08/27/21 1238  SpO2 97 % 08/27/21 1238  Vitals shown include unvalidated device data.  Last Pain:  Vitals:   08/27/21 1234  TempSrc:   PainSc: 0-No pain         Complications: No notable events documented.

## 2021-08-27 NOTE — Anesthesia Postprocedure Evaluation (Signed)
Anesthesia Post Note  Patient: Summer Shields  Procedure(s) Performed: ECT TX  Patient location during evaluation: PACU Anesthesia Type: General Level of consciousness: awake and alert Pain management: pain level controlled Vital Signs Assessment: post-procedure vital signs reviewed and stable Respiratory status: spontaneous breathing, nonlabored ventilation, respiratory function stable and patient connected to nasal cannula oxygen Cardiovascular status: blood pressure returned to baseline and stable Postop Assessment: no apparent nausea or vomiting Anesthetic complications: no   No notable events documented.   Last Vitals:  Vitals:   08/27/21 1301 08/27/21 1314  BP: 101/66 97/60  Pulse:  83  Resp:  (!) 24  Temp:  (!) 36.1 C  SpO2:  98%    Last Pain:  Vitals:   08/27/21 1314  TempSrc:   PainSc: 0-No pain                 Precious Haws Margerie Fraiser

## 2021-08-27 NOTE — Anesthesia Preprocedure Evaluation (Signed)
Anesthesia Evaluation  Patient identified by MRN, date of birth, ID band Patient awake    Reviewed: Allergy & Precautions, NPO status , Patient's Chart, lab work & pertinent test results  History of Anesthesia Complications Negative for: history of anesthetic complications  Airway Mallampati: IV  TM Distance: >3 FB Neck ROM: full    Dental  (+) Chipped   Pulmonary asthma ,    Pulmonary exam normal        Cardiovascular (-) hypertension(-) angina(-) CAD and (-) Past MI negative cardio ROS Normal cardiovascular exam(-) Valvular Problems/Murmurs     Neuro/Psych PSYCHIATRIC DISORDERS negative neurological ROS     GI/Hepatic negative GI ROS, Neg liver ROS, neg GERD  ,  Endo/Other  negative endocrine ROS  Renal/GU negative Renal ROS  negative genitourinary   Musculoskeletal   Abdominal   Peds  Hematology negative hematology ROS (+)   Anesthesia Other Findings Past Medical History: No date: Allergy No date: Anxiety No date: Depression No date: Environmental allergies No date: Fibroadenoma No date: Headache No date: Plantar fasciitis  Past Surgical History: No date: BUNIONECTOMY; Bilateral     Comment:  04/09/2013 left, 06/30/2013 right,  No date: bunionectomy revision; Right     Comment:  08/2013 No date: FOOT SURGERY No date: open plantar fasciotomy; Right     Comment:  01/16/2014 No date: plantar topaz; Bilateral     Comment:  04/09/2013, 06/30/2013 No date: TONSILLECTOMY No date: WISDOM TOOTH EXTRACTION  BMI    Body Mass Index: 41.97 kg/m      Reproductive/Obstetrics negative OB ROS                             Anesthesia Physical  Anesthesia Plan  ASA: 3  Anesthesia Plan: General   Post-op Pain Management: Minimal or no pain anticipated   Induction: Intravenous  PONV Risk Score and Plan: 3 and Propofol infusion, TIVA and Ondansetron  Airway Management Planned:  Mask  Additional Equipment: None  Intra-op Plan:   Post-operative Plan:   Informed Consent: I have reviewed the patients History and Physical, chart, labs and discussed the procedure including the risks, benefits and alternatives for the proposed anesthesia with the patient or authorized representative who has indicated his/her understanding and acceptance.     Dental advisory given  Plan Discussed with: CRNA and Surgeon  Anesthesia Plan Comments: (Patient consented for risks of anesthesia including but not limited to:  - adverse reactions to medications - risk of airway placement if required - damage to eyes, teeth, lips or other oral mucosa - nerve damage due to positioning  - sore throat or hoarseness - Damage to heart, brain, nerves, lungs, other parts of body or loss of life  Patient voiced understanding.)        Anesthesia Quick Evaluation

## 2021-08-27 NOTE — Procedures (Signed)
ECT SERVICES Physician's Interval Evaluation & Treatment Note  Patient Identification: Summer Shields MRN:  [id number] Date of Evaluation:  08/27/2021 TX #: 6  MADRS:   MMSE:   P.E. Findings:  No change to physical exam  Psychiatric Interval Note:  Mood improved energy improved  Subjective:  Patient is a 36 y.o. female seen for evaluation for Electroconvulsive Therapy. Some improvement in mood  Treatment Summary:   '[x]'$   Right Unilateral             '[]'$  Bilateral   % Energy : 0.3 ms 60%   Impedance: 2090 ohms  Seizure Energy Index: 30,162 V squared  Postictal Suppression Index: 75%  Seizure Concordance Index: 98%  Medications  Pre Shock: Robinul 0.2 mg Zofran 4 mg Brevital 120 mg succinylcholine 120 mg labetalol 10 mg  Post Shock:    Seizure Duration: 6 seconds EMG 29 seconds EEG   Comments: Return Monday  Lungs:  '[x]'$   Clear to auscultation               '[]'$  Other:   Heart:    '[x]'$   Regular rhythm             '[]'$  irregular rhythm    '[x]'$   Previous H&P reviewed, patient examined and there are NO CHANGES                 '[]'$   Previous H&P reviewed, patient examined and there are changes noted.   [name], MD 8/18/20234:17 PM

## 2021-08-30 ENCOUNTER — Encounter: Payer: Self-pay | Admitting: Registered Nurse

## 2021-08-30 ENCOUNTER — Encounter (HOSPITAL_BASED_OUTPATIENT_CLINIC_OR_DEPARTMENT_OTHER)
Admission: RE | Admit: 2021-08-30 | Discharge: 2021-08-30 | Disposition: A | Discharge status: Home / Self Care | Payer: BC Managed Care – PPO | Source: Ambulatory Visit | Attending: Psychiatry | Admitting: Psychiatry

## 2021-08-30 ENCOUNTER — Ambulatory Visit: Payer: Self-pay | Admitting: Registered Nurse

## 2021-08-30 ENCOUNTER — Other Ambulatory Visit: Payer: Self-pay | Admitting: Psychiatry

## 2021-08-30 DIAGNOSIS — F319 Bipolar disorder, unspecified: Secondary | ICD-10-CM | POA: Diagnosis not present

## 2021-08-30 DIAGNOSIS — F332 Major depressive disorder, recurrent severe without psychotic features: Secondary | ICD-10-CM | POA: Diagnosis not present

## 2021-08-30 DIAGNOSIS — F339 Major depressive disorder, recurrent, unspecified: Secondary | ICD-10-CM | POA: Diagnosis not present

## 2021-08-30 DIAGNOSIS — R200 Anesthesia of skin: Secondary | ICD-10-CM

## 2021-08-30 DIAGNOSIS — F419 Anxiety disorder, unspecified: Secondary | ICD-10-CM | POA: Diagnosis not present

## 2021-08-30 MED ORDER — KETOROLAC TROMETHAMINE 30 MG/ML IJ SOLN: 30.0000 mg | Freq: Once | INTRAMUSCULAR | Status: AC

## 2021-08-30 MED ORDER — SUCCINYLCHOLINE CHLORIDE 200 MG/10ML IV SOSY
PREFILLED_SYRINGE | INTRAVENOUS | Status: DC | PRN
  Administered 2021-08-30: 120 mg via INTRAVENOUS

## 2021-08-30 MED ORDER — ONDANSETRON HCL 4 MG/2ML IJ SOLN: 4.0000 mg | Freq: Once | INTRAMUSCULAR | Status: DC | PRN

## 2021-08-30 MED ORDER — LABETALOL HCL 5 MG/ML IV SOLN
INTRAVENOUS | Status: DC | PRN
  Administered 2021-08-30: 10 mg via INTRAVENOUS

## 2021-08-30 MED ORDER — GLYCOPYRROLATE 0.2 MG/ML IJ SOLN: 0.2000 mg | Freq: Once | INTRAMUSCULAR | Status: AC

## 2021-08-30 MED ORDER — GLYCOPYRROLATE 0.2 MG/ML IJ SOLN
INTRAMUSCULAR | Status: AC
  Administered 2021-08-30: 0.2 mg via INTRAVENOUS
  Filled 2021-08-30: qty 1

## 2021-08-30 MED ORDER — SODIUM CHLORIDE 0.9 % IV SOLN
500.0000 mL | Freq: Once | INTRAVENOUS | Status: AC
Start: 2021-08-30 — End: 2021-08-30
  Administered 2021-08-30: 500 mL via INTRAVENOUS

## 2021-08-30 MED ORDER — ONDANSETRON HCL 4 MG/2ML IJ SOLN: 4.0000 mg | Freq: Once | INTRAMUSCULAR | Status: DC

## 2021-08-30 MED ORDER — KETOROLAC TROMETHAMINE 30 MG/ML IJ SOLN
INTRAMUSCULAR | Status: AC
  Administered 2021-08-30: 30 mg via INTRAVENOUS
  Filled 2021-08-30: qty 1

## 2021-08-30 MED ORDER — METHOHEXITAL SODIUM 100 MG/10ML IV SOSY
PREFILLED_SYRINGE | INTRAVENOUS | Status: DC | PRN
  Administered 2021-08-30: 120 mg via INTRAVENOUS

## 2021-08-30 NOTE — Transfer of Care (Signed)
Immediate Anesthesia Transfer of Care Note  Patient: Summer Shields  Procedure(s) Performed: ECT TX  Patient Location: PACU  Anesthesia Type:General  Level of Consciousness: drowsy  Airway & Oxygen Therapy: Patient Spontanous Breathing  Post-op Assessment: Report given to RN and Post -op Vital signs reviewed and stable  Post vital signs: Reviewed and stable  Last Vitals:  Vitals Value Taken Time  BP 102/65 08/30/21 1344  Temp    Pulse 86 08/30/21 1345  Resp 15 08/30/21 1345  SpO2 96 % 08/30/21 1345  Vitals shown include unvalidated device data.  Last Pain:  Vitals:   08/30/21 1041  TempSrc:   PainSc: 0-No pain         Complications: No notable events documented.

## 2021-08-30 NOTE — H&P (Signed)
Summer Shields is an 36 y.o. female.   Chief Complaint: Mood is improved HPI: History of recurrent depression  Past Medical History:  Diagnosis Date   Allergy    Anxiety    Depression    Environmental allergies    Fibroadenoma    Headache    Plantar fasciitis     Past Surgical History:  Procedure Laterality Date   BUNIONECTOMY Bilateral    04/09/2013 left, 06/30/2013 right,    bunionectomy revision Right    08/2013   FOOT SURGERY     open plantar fasciotomy Right    01/16/2014   plantar topaz Bilateral    04/09/2013, 06/30/2013   TONSILLECTOMY     WISDOM TOOTH EXTRACTION      Family History  Problem Relation Age of Onset   Diabetes Maternal Grandfather    Hypertension Maternal Grandfather    Stroke Maternal Grandfather    Hyperlipidemia Maternal Grandfather    Alcohol abuse Maternal Grandfather    Depression Father    Cancer Father    ADD / ADHD Sister    Mental illness Brother        anxiety   Anxiety disorder Brother    Alcohol abuse Brother    Cancer Paternal Grandmother        skin   Cancer Mother    Social History:  reports that she has never smoked. She has never used smokeless tobacco. She reports that she does not drink alcohol and does not use drugs.  Allergies:  Allergies  Allergen Reactions   Lamictal [Lamotrigine] Rash   Codeine Nausea And Vomiting   Tramadol Nausea And Vomiting   Amoxicillin Rash    Has patient had a PCN reaction causing immediate rash, facial/tongue/throat swelling, SOB or lightheadedness with hypotension: Yes Has patient had a PCN reaction causing severe rash involving mucus membranes or skin necrosis: No Has patient had a PCN reaction that required hospitalization: No Has patient had a PCN reaction occurring within the last 10 years: No If all of the above answers are "NO", then may proceed with Cephalosporin use.     (Not in a hospital admission)   No results found for this or any previous visit (from the past 48  hour(s)). No results found.  Review of Systems  Constitutional: Negative.   HENT: Negative.    Eyes: Negative.   Respiratory: Negative.    Cardiovascular: Negative.   Gastrointestinal: Negative.   Musculoskeletal: Negative.   Skin: Negative.   Neurological: Negative.   Psychiatric/Behavioral: Negative.    All other systems reviewed and are negative.   Blood pressure (!) 113/97, pulse 97, temperature 98.1 F (36.7 C), temperature source Oral, resp. rate 18, height '5\' 6"'$  (1.676 m), weight 117.9 kg, SpO2 100 %. Physical Exam Vitals reviewed.  Constitutional:      Appearance: She is well-developed.  HENT:     Head: Normocephalic and atraumatic.  Eyes:     Conjunctiva/sclera: Conjunctivae normal.     Pupils: Pupils are equal, round, and reactive to light.  Cardiovascular:     Heart sounds: Normal heart sounds.  Pulmonary:     Effort: Pulmonary effort is normal.  Abdominal:     Palpations: Abdomen is soft.  Musculoskeletal:        General: Normal range of motion.     Cervical back: Normal range of motion.  Skin:    General: Skin is warm and dry.  Neurological:     General: No focal deficit present.  Mental Status: She is alert.  Psychiatric:        Mood and Affect: Mood normal.      Assessment/Plan Likely we will be finishing up the index course at this point  [name], MD 08/30/2021, 11:34 AM

## 2021-08-30 NOTE — Anesthesia Postprocedure Evaluation (Signed)
Anesthesia Post Note  Patient: Summer Shields  Procedure(s) Performed: ECT TX  Patient location during evaluation: PACU Anesthesia Type: General Level of consciousness: awake and alert Pain management: pain level controlled Vital Signs Assessment: post-procedure vital signs reviewed and stable Respiratory status: spontaneous breathing, nonlabored ventilation, respiratory function stable and patient connected to nasal cannula oxygen Cardiovascular status: blood pressure returned to baseline and stable Postop Assessment: no apparent nausea or vomiting Anesthetic complications: no   No notable events documented.   Last Vitals:  Vitals:   08/30/21 1036 08/30/21 1345  BP: (!) 113/97 102/65  Pulse: 97 88  Resp: 18 (!) 21  Temp: 36.7 C 37.3 C  SpO2: 100% 93%    Last Pain:  Vitals:   08/30/21 1345  TempSrc:   PainSc: Asleep                 Arita Miss

## 2021-08-30 NOTE — Procedures (Signed)
ECT SERVICES Physician's Interval Evaluation & Treatment Note  Patient Identification: Summer Shields MRN:  [id number] Date of Evaluation:  08/30/2021 TX #: 7  MADRS:   MMSE:   P.E. Findings:  No change to physical exam  Psychiatric Interval Note:  Mood and activity level clearly better  Subjective:  Patient is a 36 y.o. female seen for evaluation for Electroconvulsive Therapy. A little memory trouble  Treatment Summary:   '[x]'$   Right Unilateral             '[]'$  Bilateral   % Energy : 0.3 ms 60%   Impedance: 1440 ohms  Seizure Energy Index: 34,455 V squared  Postictal Suppression Index: 72%  Seizure Concordance Index: 98%  Medications  Pre Shock: Robinul 0.2 mg Zofran 4 mg Brevital 120 mg succinylcholine 120 mg labetalol 10 mg  Post Shock:    Seizure Duration: 17 seconds EMG 27 seconds EEG   Comments: We agreed on a plan to follow up in 3 weeks  Lungs:  '[x]'$   Clear to auscultation               '[]'$  Other:   Heart:    '[x]'$   Regular rhythm             '[]'$  irregular rhythm    '[x]'$   Previous H&P reviewed, patient examined and there are NO CHANGES                 '[]'$   Previous H&P reviewed, patient examined and there are changes noted.   [name], MD 8/21/20233:06 PM

## 2021-08-30 NOTE — Anesthesia Preprocedure Evaluation (Signed)
Anesthesia Evaluation  Patient identified by MRN, date of birth, ID band Patient awake    Reviewed: Allergy & Precautions, NPO status , Patient's Chart, lab work & pertinent test results  History of Anesthesia Complications Negative for: history of anesthetic complications  Airway Mallampati: III  TM Distance: >3 FB Neck ROM: full    Dental  (+) Chipped   Pulmonary asthma ,    Pulmonary exam normal breath sounds clear to auscultation       Cardiovascular (-) hypertension(-) angina(-) CAD and (-) Past MI negative cardio ROS Normal cardiovascular exam(-) Valvular Problems/Murmurs Rhythm:Regular Rate:Normal - Systolic murmurs    Neuro/Psych  Headaches, PSYCHIATRIC DISORDERS Anxiety Depression    GI/Hepatic negative GI ROS, Neg liver ROS, neg GERD  ,  Endo/Other  negative endocrine ROS  Renal/GU negative Renal ROS  negative genitourinary   Musculoskeletal   Abdominal (+) + obese,   Peds  Hematology negative hematology ROS (+)   Anesthesia Other Findings Past Medical History: No date: Allergy No date: Anxiety No date: Depression No date: Environmental allergies No date: Fibroadenoma No date: Headache No date: Plantar fasciitis  Past Surgical History: No date: BUNIONECTOMY; Bilateral     Comment:  04/09/2013 left, 06/30/2013 right,  No date: bunionectomy revision; Right     Comment:  08/2013 No date: FOOT SURGERY No date: open plantar fasciotomy; Right     Comment:  01/16/2014 No date: plantar topaz; Bilateral     Comment:  04/09/2013, 06/30/2013 No date: TONSILLECTOMY No date: WISDOM TOOTH EXTRACTION  BMI    Body Mass Index: 41.97 kg/m      Reproductive/Obstetrics negative OB ROS                             Anesthesia Physical  Anesthesia Plan  ASA: 3  Anesthesia Plan: General   Post-op Pain Management: Minimal or no pain anticipated   Induction: Intravenous  PONV Risk  Score and Plan: 3 and Propofol infusion, TIVA and Ondansetron  Airway Management Planned: Mask  Additional Equipment: None  Intra-op Plan:   Post-operative Plan:   Informed Consent: I have reviewed the patients History and Physical, chart, labs and discussed the procedure including the risks, benefits and alternatives for the proposed anesthesia with the patient or authorized representative who has indicated his/her understanding and acceptance.     Dental advisory given  Plan Discussed with: CRNA and Surgeon  Anesthesia Plan Comments: (Patient consented for risks of anesthesia including but not limited to:  - adverse reactions to medications - risk of airway placement if required - damage to eyes, teeth, lips or other oral mucosa - nerve damage due to positioning  - sore throat or hoarseness - Damage to heart, brain, nerves, lungs, other parts of body or loss of life  Patient voiced understanding.)        Anesthesia Quick Evaluation

## 2021-09-21 ENCOUNTER — Other Ambulatory Visit: Payer: Self-pay | Admitting: Psychiatry

## 2021-09-21 DIAGNOSIS — R200 Anesthesia of skin: Secondary | ICD-10-CM

## 2021-09-22 ENCOUNTER — Encounter: Payer: Self-pay | Admitting: Certified Registered Nurse Anesthetist

## 2021-09-22 ENCOUNTER — Encounter
Admission: RE | Admit: 2021-09-22 | Discharge: 2021-09-22 | Disposition: A | Discharge status: Home / Self Care | Payer: BC Managed Care – PPO | Source: Ambulatory Visit | Attending: Psychiatry | Admitting: Psychiatry

## 2021-09-22 DIAGNOSIS — R200 Anesthesia of skin: Secondary | ICD-10-CM | POA: Insufficient documentation

## 2021-09-22 MED ORDER — GLYCOPYRROLATE 0.2 MG/ML IJ SOLN: 0.2000 mg | Freq: Once | INTRAMUSCULAR | Status: AC

## 2021-09-22 MED ORDER — GLYCOPYRROLATE 0.2 MG/ML IJ SOLN
INTRAMUSCULAR | Status: AC
  Administered 2021-09-22: 0.2 mg via INTRAVENOUS
  Filled 2021-09-22: qty 1

## 2021-09-22 MED ORDER — KETOROLAC TROMETHAMINE 30 MG/ML IJ SOLN: 30.0000 mg | Freq: Once | INTRAMUSCULAR | Status: AC

## 2021-09-22 MED ORDER — SODIUM CHLORIDE 0.9 % IV SOLN
500.0000 mL | Freq: Once | INTRAVENOUS | Status: AC
  Administered 2021-09-22: 500 mL via INTRAVENOUS

## 2021-09-22 MED ORDER — KETOROLAC TROMETHAMINE 30 MG/ML IJ SOLN
INTRAMUSCULAR | Status: AC
  Administered 2021-09-22: 30 mg via INTRAVENOUS
  Filled 2021-09-22: qty 1

## 2021-09-22 NOTE — Anesthesia Preprocedure Evaluation (Deleted)
Anesthesia Evaluation  Patient identified by MRN, date of birth, ID band Patient awake    Reviewed: Allergy & Precautions, NPO status , Patient's Chart, lab work & pertinent test results  History of Anesthesia Complications (+) PONV and history of anesthetic complications  Airway Mallampati: II   Neck ROM: Full    Dental no notable dental hx.    Pulmonary asthma ,    Pulmonary exam normal breath sounds clear to auscultation       Cardiovascular Exercise Tolerance: Good negative cardio ROS Normal cardiovascular exam Rhythm:Regular Rate:Normal  ECG 08/04/21:  Normal sinus rhythm Low voltage QRS   Neuro/Psych  Headaches, PSYCHIATRIC DISORDERS Anxiety Depression    GI/Hepatic negative GI ROS,   Endo/Other  Class 3 obesity  Renal/GU negative Renal ROS     Musculoskeletal   Abdominal   Peds  Hematology negative hematology ROS (+)   Anesthesia Other Findings   Reproductive/Obstetrics                             Anesthesia Physical Anesthesia Plan  ASA: 3  Anesthesia Plan: General   Post-op Pain Management:    Induction: Intravenous  PONV Risk Score and Plan: 4 or greater and TIVA and Treatment may vary due to age or medical condition  Airway Management Planned: Natural Airway  Additional Equipment:   Intra-op Plan:   Post-operative Plan:   Informed Consent: I have reviewed the patients History and Physical, chart, labs and discussed the procedure including the risks, benefits and alternatives for the proposed anesthesia with the patient or authorized representative who has indicated his/her understanding and acceptance.       Plan Discussed with: CRNA  Anesthesia Plan Comments: (Serial consent on chart.)        Anesthesia Quick Evaluation

## 2021-09-27 DIAGNOSIS — F331 Major depressive disorder, recurrent, moderate: Secondary | ICD-10-CM | POA: Diagnosis not present

## 2021-10-12 DIAGNOSIS — F331 Major depressive disorder, recurrent, moderate: Secondary | ICD-10-CM | POA: Diagnosis not present

## 2021-11-09 DIAGNOSIS — F331 Major depressive disorder, recurrent, moderate: Secondary | ICD-10-CM | POA: Diagnosis not present

## 2021-11-25 ENCOUNTER — Other Ambulatory Visit: Payer: Self-pay | Admitting: Psychiatry

## 2021-11-25 DIAGNOSIS — R200 Anesthesia of skin: Secondary | ICD-10-CM

## 2021-11-26 ENCOUNTER — Encounter: Payer: Self-pay | Admitting: Anesthesiology

## 2021-11-26 ENCOUNTER — Ambulatory Visit
Admission: RE | Admit: 2021-11-26 | Discharge: 2021-11-26 | Disposition: A | Discharge status: Home / Self Care | Payer: BC Managed Care – PPO | Source: Ambulatory Visit | Attending: Psychiatry | Admitting: Psychiatry

## 2021-11-26 DIAGNOSIS — F418 Other specified anxiety disorders: Secondary | ICD-10-CM | POA: Diagnosis not present

## 2021-11-26 DIAGNOSIS — F332 Major depressive disorder, recurrent severe without psychotic features: Secondary | ICD-10-CM | POA: Diagnosis not present

## 2021-11-26 DIAGNOSIS — Z6839 Body mass index (BMI) 39.0-39.9, adult: Secondary | ICD-10-CM | POA: Diagnosis not present

## 2021-11-26 DIAGNOSIS — R200 Anesthesia of skin: Secondary | ICD-10-CM | POA: Diagnosis not present

## 2021-11-26 DIAGNOSIS — R569 Unspecified convulsions: Secondary | ICD-10-CM | POA: Diagnosis not present

## 2021-11-26 DIAGNOSIS — J45909 Unspecified asthma, uncomplicated: Secondary | ICD-10-CM | POA: Diagnosis not present

## 2021-11-26 DIAGNOSIS — E669 Obesity, unspecified: Secondary | ICD-10-CM | POA: Diagnosis not present

## 2021-11-26 LAB — POCT PREGNANCY, URINE: Preg Test, Ur: NEGATIVE

## 2021-11-26 MED ORDER — PROPOFOL 10 MG/ML IV BOLUS
INTRAVENOUS | Status: AC
  Filled 2021-11-26: qty 20

## 2021-11-26 MED ORDER — GLYCOPYRROLATE 0.2 MG/ML IJ SOLN
INTRAMUSCULAR | Status: AC
  Filled 2021-11-26: qty 1

## 2021-11-26 MED ORDER — LABETALOL HCL 5 MG/ML IV SOLN
INTRAVENOUS | Status: DC | PRN
  Administered 2021-11-26: 10 mg via INTRAVENOUS

## 2021-11-26 MED ORDER — SODIUM CHLORIDE 0.9 % IV SOLN
500.0000 mL | Freq: Once | INTRAVENOUS | Status: AC
  Administered 2021-11-26: 500 mL via INTRAVENOUS

## 2021-11-26 MED ORDER — SODIUM CHLORIDE 0.9 % IV SOLN: INTRAVENOUS | Status: DC | PRN

## 2021-11-26 MED ORDER — SUCCINYLCHOLINE CHLORIDE 200 MG/10ML IV SOSY
PREFILLED_SYRINGE | INTRAVENOUS | Status: DC | PRN
  Administered 2021-11-26: 120 mg via INTRAVENOUS

## 2021-11-26 MED ORDER — PROPOFOL 10 MG/ML IV BOLUS
INTRAVENOUS | Status: DC | PRN
  Administered 2021-11-26: 120 mg via INTRAVENOUS

## 2021-11-26 MED ORDER — GLYCOPYRROLATE 0.2 MG/ML IJ SOLN
0.2000 mg | Freq: Once | INTRAMUSCULAR | Status: AC
  Administered 2021-11-26: 0.2 mg via INTRAVENOUS

## 2021-11-26 NOTE — Anesthesia Procedure Notes (Signed)
Procedure Name: General with mask airway Date/Time: 11/26/2021 1:13 PM  Performed by: Aline Brochure, CRNAPre-anesthesia Checklist: Patient identified, Emergency Drugs available, Suction available and Patient being monitored Patient Re-evaluated:Patient Re-evaluated prior to induction Oxygen Delivery Method: Circle system utilized Preoxygenation: Pre-oxygenation with 100% oxygen Induction Type: IV induction Ventilation: Mask ventilation without difficulty and Mask ventilation throughout procedure Airway Equipment and Method: Bite block Placement Confirmation: positive ETCO2 Dental Injury: Teeth and Oropharynx as per pre-operative assessment

## 2021-11-26 NOTE — H&P (Signed)
Summer Shields is an 36 y.o. female.   Chief Complaint: Patient return for maintenance ECT.  Has had some spells of low mood.  No active suicidal ideation and no psychosis. HPI: History of previous response to ECT but had chosen to not have maintenance because of problems with short-term memory.  Returns now seeking 1 treatment as part of a improvement in her mood  Past Medical History:  Diagnosis Date   Allergy    Anxiety    Depression    Environmental allergies    Fibroadenoma    Headache    Plantar fasciitis     Past Surgical History:  Procedure Laterality Date   BUNIONECTOMY Bilateral    04/09/2013 left, 06/30/2013 right,    bunionectomy revision Right    08/2013   FOOT SURGERY     open plantar fasciotomy Right    01/16/2014   plantar topaz Bilateral    04/09/2013, 06/30/2013   TONSILLECTOMY     WISDOM TOOTH EXTRACTION      Family History  Problem Relation Age of Onset   Diabetes Maternal Grandfather    Hypertension Maternal Grandfather    Stroke Maternal Grandfather    Hyperlipidemia Maternal Grandfather    Alcohol abuse Maternal Grandfather    Depression Father    Cancer Father    ADD / ADHD Sister    Mental illness Brother        anxiety   Anxiety disorder Brother    Alcohol abuse Brother    Cancer Paternal Grandmother        skin   Cancer Mother    Social History:  reports that she has never smoked. She has never used smokeless tobacco. She reports that she does not drink alcohol and does not use drugs.  Allergies:  Allergies  Allergen Reactions   Lamictal [Lamotrigine] Rash   Codeine Nausea And Vomiting   Tramadol Nausea And Vomiting   Amoxicillin Rash    Has patient had a PCN reaction causing immediate rash, facial/tongue/throat swelling, SOB or lightheadedness with hypotension: Yes Has patient had a PCN reaction causing severe rash involving mucus membranes or skin necrosis: No Has patient had a PCN reaction that required hospitalization: No Has  patient had a PCN reaction occurring within the last 10 years: No If all of the above answers are "NO", then may proceed with Cephalosporin use.     (Not in a hospital admission)   Results for orders placed or performed during the hospital encounter of 11/26/21 (from the past 48 hour(s))  Pregnancy, urine POC     Status: None   Collection Time: 11/26/21 10:49 AM  Result Value Ref Range   Preg Test, Ur NEGATIVE NEGATIVE    Comment:        THE SENSITIVITY OF THIS METHODOLOGY IS >24 mIU/mL    No results found.  Review of Systems  Constitutional: Negative.   HENT: Negative.    Eyes: Negative.   Respiratory: Negative.    Cardiovascular: Negative.   Gastrointestinal: Negative.   Musculoskeletal: Negative.   Skin: Negative.   Neurological: Negative.   Psychiatric/Behavioral:  Positive for dysphoric mood. Negative for suicidal ideas.     Blood pressure (!) 103/57, pulse 80, temperature 98.3 F (36.8 C), temperature source Oral, resp. rate 12, height '5\' 6"'$  (1.676 m), weight 109.8 kg, SpO2 96 %. Physical Exam Vitals and nursing note reviewed.  Constitutional:      Appearance: She is well-developed.  HENT:     Head: Normocephalic  and atraumatic.  Eyes:     Conjunctiva/sclera: Conjunctivae normal.     Pupils: Pupils are equal, round, and reactive to light.  Cardiovascular:     Heart sounds: Normal heart sounds.  Pulmonary:     Effort: Pulmonary effort is normal.  Abdominal:     Palpations: Abdomen is soft.  Musculoskeletal:        General: Normal range of motion.     Cervical back: Normal range of motion.  Skin:    General: Skin is warm and dry.  Neurological:     General: No focal deficit present.     Mental Status: She is alert.  Psychiatric:        Attention and Perception: Attention normal.        Mood and Affect: Mood is anxious.        Speech: Speech normal.        Behavior: Behavior normal.        Thought Content: Thought content normal.        Cognition and  Memory: Cognition normal.      Assessment/Plan Treatment today.  Suggested to the patient that we schedule her for a maintenance treatment which she declined to do and instead wants to only follow-up as needed  [name], MD 11/26/2021, 3:35 PM

## 2021-11-26 NOTE — Transfer of Care (Signed)
Immediate Anesthesia Transfer of Care Note  Patient: Summer Shields  Procedure(s) Performed: ECT TX  Patient Location: PACU  Anesthesia Type:General  Level of Consciousness: drowsy  Airway & Oxygen Therapy: Patient Spontanous Breathing and Patient connected to face mask oxygen  Post-op Assessment: Report given to RN and Post -op Vital signs reviewed and stable  Post vital signs: Reviewed and stable  Last Vitals:  Vitals Value Taken Time  BP 115/71 11/26/21 1330  Temp 36.3 C 11/26/21 1324  Pulse 87 11/26/21 1332  Resp 18 11/26/21 1332  SpO2 88 % 11/26/21 1332  Vitals shown include unvalidated device data.  Last Pain:  Vitals:   11/26/21 1324  TempSrc:   PainSc: Asleep         Complications: No notable events documented.

## 2021-11-26 NOTE — Anesthesia Preprocedure Evaluation (Signed)
Anesthesia Evaluation  Patient identified by MRN, date of birth, ID band Patient awake    Reviewed: Allergy & Precautions, NPO status , Patient's Chart, lab work & pertinent test results  History of Anesthesia Complications Negative for: history of anesthetic complications  Airway Mallampati: IV  TM Distance: >3 FB Neck ROM: full    Dental  (+) Chipped   Pulmonary asthma    Pulmonary exam normal        Cardiovascular (-) hypertension(-) angina (-) CAD and (-) Past MI negative cardio ROS Normal cardiovascular exam(-) Valvular Problems/Murmurs     Neuro/Psych  PSYCHIATRIC DISORDERS      negative neurological ROS     GI/Hepatic negative GI ROS, Neg liver ROS,neg GERD  ,,  Endo/Other  negative endocrine ROS    Renal/GU negative Renal ROS  negative genitourinary   Musculoskeletal   Abdominal   Peds  Hematology negative hematology ROS (+)   Anesthesia Other Findings Past Medical History: No date: Allergy No date: Anxiety No date: Depression No date: Environmental allergies No date: Fibroadenoma No date: Headache No date: Plantar fasciitis  Past Surgical History: No date: BUNIONECTOMY; Bilateral     Comment:  04/09/2013 left, 06/30/2013 right,  No date: bunionectomy revision; Right     Comment:  08/2013 No date: FOOT SURGERY No date: open plantar fasciotomy; Right     Comment:  01/16/2014 No date: plantar topaz; Bilateral     Comment:  04/09/2013, 06/30/2013 No date: TONSILLECTOMY No date: WISDOM TOOTH EXTRACTION  BMI    Body Mass Index: 41.97 kg/m      Reproductive/Obstetrics negative OB ROS                              Anesthesia Physical Anesthesia Plan  ASA: 3  Anesthesia Plan: General   Post-op Pain Management: Minimal or no pain anticipated   Induction: Intravenous  PONV Risk Score and Plan: 3 and Propofol infusion, TIVA and Ondansetron  Airway Management Planned:  Mask  Additional Equipment: None  Intra-op Plan:   Post-operative Plan:   Informed Consent: I have reviewed the patients History and Physical, chart, labs and discussed the procedure including the risks, benefits and alternatives for the proposed anesthesia with the patient or authorized representative who has indicated his/her understanding and acceptance.     Dental advisory given  Plan Discussed with: CRNA and Surgeon  Anesthesia Plan Comments: (Patient consented for risks of anesthesia including but not limited to:  - adverse reactions to medications - risk of airway placement if required - damage to eyes, teeth, lips or other oral mucosa - nerve damage due to positioning  - sore throat or hoarseness - Damage to heart, brain, nerves, lungs, other parts of body or loss of life  Patient voiced understanding.)         Anesthesia Quick Evaluation

## 2021-11-26 NOTE — Procedures (Signed)
ECT SERVICES Physician's Interval Evaluation & Treatment Note  Patient Identification: Summer Shields MRN:  [id number] Date of Evaluation:  11/26/2021 TX #: [id number]  MADRS:   MMSE:   P.E. Findings:  No change to physical exam  Psychiatric Interval Note:  A little bit more anxious no suicidal thought no psychosis  Subjective:  Patient is a 36 y.o. female seen for evaluation for Electroconvulsive Therapy. A little bit more down but some improvement since starting Vraylar  Treatment Summary:   '[x]'$   Right Unilateral             '[]'$  Bilateral   % Energy : 0.3 ms 60%   Impedance: 2050 ohms  Seizure Energy Index: 30,393 V squared  Postictal Suppression Index: 57%  Seizure Concordance Index: 98%  Medications  Pre Shock: Robinul 0.2 mg Zofran 4 mg propofol 120 mg succinylcholine 120 mg labetalol 10 mg  Post Shock:    Seizure Duration: 13 seconds EMG 47 seconds EEG   Comments: Follow-up as needed at her request  Lungs:  '[x]'$   Clear to auscultation               '[]'$  Other:   Heart:    '[x]'$   Regular rhythm             '[]'$  irregular rhythm    '[x]'$   Previous H&P reviewed, patient examined and there are NO CHANGES                 '[]'$   Previous H&P reviewed, patient examined and there are changes noted.   [name], MD 11/17/20233:44 PM

## 2021-11-26 NOTE — Anesthesia Postprocedure Evaluation (Signed)
Anesthesia Post Note  Patient: Summer Shields  Procedure(s) Performed: ECT TX  Patient location during evaluation: PACU Anesthesia Type: General Level of consciousness: awake and alert Pain management: pain level controlled Vital Signs Assessment: post-procedure vital signs reviewed and stable Respiratory status: spontaneous breathing, nonlabored ventilation, respiratory function stable and patient connected to nasal cannula oxygen Cardiovascular status: blood pressure returned to baseline and stable Postop Assessment: no apparent nausea or vomiting Anesthetic complications: no   No notable events documented.   Last Vitals:  Vitals:   11/26/21 1330 11/26/21 1340  BP: 115/71 (!) 100/59  Pulse: 85 82  Resp: (!) 30 (!) 25  Temp:    SpO2: 92% 93%    Last Pain:  Vitals:   11/26/21 1324  TempSrc:   PainSc: Asleep                 Precious Haws Kellyn Mansfield

## 2021-12-07 DIAGNOSIS — F331 Major depressive disorder, recurrent, moderate: Secondary | ICD-10-CM | POA: Diagnosis not present

## 2021-12-20 ENCOUNTER — Ambulatory Visit (INDEPENDENT_AMBULATORY_CARE_PROVIDER_SITE_OTHER): Payer: BC Managed Care – PPO | Admitting: Family Medicine

## 2021-12-20 ENCOUNTER — Encounter (INDEPENDENT_AMBULATORY_CARE_PROVIDER_SITE_OTHER): Payer: Self-pay | Admitting: Family Medicine

## 2021-12-20 VITALS — BP 116/82 | HR 88 | Temp 98.4°F | Ht 67.0 in | Wt 243.0 lb

## 2021-12-20 DIAGNOSIS — E669 Obesity, unspecified: Secondary | ICD-10-CM

## 2021-12-20 DIAGNOSIS — R5383 Other fatigue: Secondary | ICD-10-CM

## 2021-12-20 DIAGNOSIS — Z6838 Body mass index (BMI) 38.0-38.9, adult: Secondary | ICD-10-CM | POA: Diagnosis not present

## 2021-12-20 DIAGNOSIS — Z0289 Encounter for other administrative examinations: Secondary | ICD-10-CM

## 2021-12-29 NOTE — Progress Notes (Signed)
Office: [phone]  /  Fax: [phone]   Initial Visit  Summer Shields was seen in clinic today to evaluate for obesity. She is interested in losing weight to improve overall health and reduce the risk of weight related complications. She presents today to review program treatment options, initial physical assessment, and evaluation.      When asked what else they would like to accomplish? She states: Improve existing medical conditions and Improve quality of life  When asked how has your weight affected you? She states: Contributed to orthopedic problems or mobility issues, Having fatigue, and Problems with depression and or anxiety  Some associated conditions: Other: hypoglycemia  Contributing factors: Family history and Reduced physical activity  Weight promoting medications identified: Psychotropic medications  Current nutrition plan: Other: decrease eating out  Current level of physical activity: None  Response to medication: Never tried medications   Past medical history includes:   Past Medical History:  Diagnosis Date   Allergy    Anxiety    Depression    Environmental allergies    Fibroadenoma    Headache    Plantar fasciitis      Objective:   BP 116/82   Pulse 88   Temp 98.4 F (36.9 C)   Ht '5\' 7"'$  (1.702 m)   Wt 243 lb (110.2 kg)   SpO2 94%   BMI 38.06 kg/m  She was weighed on the bioimpedance scale: Body mass index is 38.06 kg/m.  Visceral Fat Rating: 11, Body Fat%:45.5.  General:  Alert, oriented and cooperative. Patient is in no acute distress.  Respiratory: Normal respiratory effort, no problems with respiration noted  Extremities: Normal range of motion.    Mental Status: Normal mood and affect. Normal behavior. Normal judgment and thought content.   Assessment and Plan:  1. Other fatigue Summer Shields notes ongoing levels of fatigue which may be due in part to mood, but also may be due to other causes.   We will plan for the patient to  return for fasting labs and further evaluation on appropriate, and do-able eating plans and exercise strategies.   2. Class 2 obesity without serious comorbidity with body mass index (BMI) of 38.0 to 38.9 in adult, unspecified obesity type We reviewed weight, biometrics, associated medical conditions and contributing factors with patient. She would benefit from weight loss therapy via a modified calorie, low-carb, high-protein nutritional plan tailored to their REE (resting energy expenditure) which will be determined by indirect calorimetry.  We will also assess for cardiometabolic risk and nutritional derangements via fasting serologies at her next appointment.      Obesity Treatment / Action Plan:  Patient will work on garnering support from family and friends to begin weight loss journey. Will be scheduled for indirect calorimetry to determine resting energy expenditure in a fasting state.  This will allow [place] to create a reduced calorie, high-protein meal plan to promote loss of fat mass while preserving muscle mass. Will reduce the frequency of eating out and making healthier choices by advanced menu planning. Was counseled on nutritional approaches to weight loss and benefits of complex carbs and high quality protein as part of nutritional weight management.  Obesity Education Performed Today:  She was weighed on the bioimpedance scale and results were discussed and documented in the synopsis.  We discussed obesity as a disease and the importance of a more detailed evaluation of all the factors contributing to the disease.  We discussed the importance of long term lifestyle changes  which include nutrition, exercise and behavioral modifications as well as the importance of customizing this to her specific health and social needs.  We discussed the benefits of reaching a healthier weight to alleviate the symptoms of existing conditions and reduce the risks of the biomechanical, metabolic and  psychological effects of obesity.  Summer Shields appears to be in the action stage of change and states they are ready to start intensive lifestyle modifications and behavioral modifications.   Reviewed by clinician on day of visit: allergies, medications, problem list, medical history, surgical history, family history, social history, and previous encounter notes.  I have personally spent 35 minutes total time today in preparation, patient care, and documentation for this visit, including the following: review of clinical lab tests; review of medical tests/procedures/services.   I, Trixie Dredge, am acting as transcriptionist for Dennard Nip, MD.   I have reviewed the above documentation for accuracy and completeness, and I agree with the above. Dennard Nip, MD

## 2022-01-19 ENCOUNTER — Ambulatory Visit (INDEPENDENT_AMBULATORY_CARE_PROVIDER_SITE_OTHER): Payer: BC Managed Care – PPO | Admitting: Family Medicine

## 2022-01-19 ENCOUNTER — Encounter (INDEPENDENT_AMBULATORY_CARE_PROVIDER_SITE_OTHER): Payer: Self-pay | Admitting: Family Medicine

## 2022-01-19 VITALS — BP 102/70 | HR 94 | Temp 98.0°F | Ht 67.0 in | Wt 251.0 lb

## 2022-01-19 DIAGNOSIS — E559 Vitamin D deficiency, unspecified: Secondary | ICD-10-CM

## 2022-01-19 DIAGNOSIS — F32A Depression, unspecified: Secondary | ICD-10-CM

## 2022-01-19 DIAGNOSIS — E88819 Insulin resistance, unspecified: Secondary | ICD-10-CM

## 2022-01-19 DIAGNOSIS — R5383 Other fatigue: Secondary | ICD-10-CM

## 2022-01-19 DIAGNOSIS — Z6839 Body mass index (BMI) 39.0-39.9, adult: Secondary | ICD-10-CM

## 2022-01-19 DIAGNOSIS — R0602 Shortness of breath: Secondary | ICD-10-CM

## 2022-01-19 DIAGNOSIS — F419 Anxiety disorder, unspecified: Secondary | ICD-10-CM

## 2022-01-20 LAB — COMPREHENSIVE METABOLIC PANEL
ALT: 18 IU/L (ref 0–32)
AST: 20 IU/L (ref 0–40)
Albumin/Globulin Ratio: 1.9 (ref 1.2–2.2)
Albumin: 4.1 g/dL (ref 3.9–4.9)
Alkaline Phosphatase: 78 IU/L (ref 44–121)
BUN/Creatinine Ratio: 14 (ref 9–23)
BUN: 11 mg/dL (ref 6–20)
Bilirubin Total: 0.4 mg/dL (ref 0.0–1.2)
CO2: 21 mmol/L (ref 20–29)
Calcium: 8.9 mg/dL (ref 8.7–10.2)
Chloride: 105 mmol/L (ref 96–106)
Creatinine, Ser: 0.77 mg/dL (ref 0.57–1.00)
Globulin, Total: 2.2 g/dL (ref 1.5–4.5)
Glucose: 75 mg/dL (ref 70–99)
Potassium: 4.3 mmol/L (ref 3.5–5.2)
Sodium: 141 mmol/L (ref 134–144)
Total Protein: 6.3 g/dL (ref 6.0–8.5)
eGFR: 102 mL/min/{1.73_m2} (ref >59)

## 2022-01-20 LAB — CBC WITH DIFFERENTIAL/PLATELET
Basophils Absolute: 0.1 10*3/uL (ref 0.0–0.2)
Basos: 1 %
EOS (ABSOLUTE): 0.3 10*3/uL (ref 0.0–0.4)
Eos: 3 %
Hematocrit: 42.7 % (ref 34.0–46.6)
Hemoglobin: 14 g/dL (ref 11.1–15.9)
Immature Grans (Abs): 0.1 10*3/uL (ref 0.0–0.1)
Immature Granulocytes: 1 %
Lymphocytes Absolute: 3.6 10*3/uL — ABNORMAL HIGH (ref 0.7–3.1)
Lymphs: 28 %
MCH: 29.1 pg (ref 26.6–33.0)
MCHC: 32.8 g/dL (ref 31.5–35.7)
MCV: 89 fL (ref 79–97)
Monocytes Absolute: 0.6 10*3/uL (ref 0.1–0.9)
Monocytes: 5 %
Neutrophils Absolute: 8.1 10*3/uL — ABNORMAL HIGH (ref 1.4–7.0)
Neutrophils: 62 %
Platelets: 261 10*3/uL (ref 150–450)
RBC: 4.81 x10E6/uL (ref 3.77–5.28)
RDW: 11.8 % (ref 11.7–15.4)
WBC: 12.8 10*3/uL — ABNORMAL HIGH (ref 3.4–10.8)

## 2022-01-20 LAB — VITAMIN B12: Vitamin B-12: 560 pg/mL (ref 232–1245)

## 2022-01-20 LAB — T4, FREE: Free T4: 1.04 ng/dL (ref 0.82–1.77)

## 2022-01-20 LAB — T3: T3, Total: 93 ng/dL (ref 71–180)

## 2022-01-20 LAB — INSULIN, RANDOM: INSULIN: 23.1 u[IU]/mL (ref 2.6–24.9)

## 2022-01-20 LAB — LIPID PANEL WITH LDL/HDL RATIO
Cholesterol, Total: 174 mg/dL (ref 100–199)
HDL: 62 mg/dL (ref >39)
LDL Chol Calc (NIH): 97 mg/dL (ref 0–99)
LDL/HDL Ratio: 1.6 ratio (ref 0.0–3.2)
Triglycerides: 80 mg/dL (ref 0–149)
VLDL Cholesterol Cal: 15 mg/dL (ref 5–40)

## 2022-01-20 LAB — HEMOGLOBIN A1C
Est. average glucose Bld gHb Est-mCnc: 105 mg/dL
Hgb A1c MFr Bld: 5.3 % (ref 4.8–5.6)

## 2022-01-20 LAB — VITAMIN D 25 HYDROXY (VIT D DEFICIENCY, FRACTURES): Vit D, 25-Hydroxy: 35.4 ng/mL (ref 30.0–100.0)

## 2022-01-20 LAB — TSH: TSH: 3.23 u[IU]/mL (ref 0.450–4.500)

## 2022-01-20 LAB — FOLATE: Folate: 9.1 ng/mL (ref >3.0)

## 2022-02-01 DIAGNOSIS — F331 Major depressive disorder, recurrent, moderate: Secondary | ICD-10-CM | POA: Diagnosis not present

## 2022-02-01 NOTE — Progress Notes (Signed)
Chief Complaint:   Cement (MR# [id number]) is a 37 y.o. female who presents for evaluation and treatment of obesity and related comorbidities. Current BMI is Body mass index is 39.31 kg/m. Lynde has been struggling with her weight for many years and has been unsuccessful in either losing weight, maintaining weight loss, or reaching her healthy weight goal.  Dr. Leafy Ro information session appointment.  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  Previously counted calories and got in around 1300 cal/day.  Skips breakfast usually due to time.  Coffee in a.m. with creamer 1 teaspoon and sugar 2 teaspoons.  Snack is 1 yogurt, handful of almonds with or without toast.  Lunch is instant soup woth rice noodles with or without tofu (satisfied).  Dinner is veg soup with chicken or pasta with or without carrots, sour cream, broccoli (feels full).  May have another snack.  Seren is currently in the action stage of change and ready to dedicate time achieving and maintaining a healthier weight. Delorice is interested in becoming our patient and working on intensive lifestyle modifications including (but not limited to) diet and exercise for weight loss.  Ramani's habits were reviewed today and are as follows: she struggles with family and or coworkers weight loss sabotage, her desired weight loss is 65 pounds, she started gaining weight at 37 years old, her heaviest weight ever was 285 pounds, she is a picky eater and doesn't like to eat healthier foods, she has significant food cravings issues, she snacks frequently in the evenings, she skips meals frequently, she is frequently drinking liquids with calories, she frequently makes poor food choices, she frequently eats larger portions than normal, and she struggles with emotional eating.  Depression Screen Tinlee's Food and Mood (modified PHQ-9) score was 6.     05/22/2017     8:42 AM  Depression screen PHQ 2/9  Decreased Interest 1  Down, Depressed, Hopeless 1  PHQ - 2 Score 2  Altered sleeping 3  Tired, decreased energy 3  Change in appetite 0  Feeling bad or failure about yourself  1  Trouble concentrating 1  Moving slowly or fidgety/restless 0  Suicidal thoughts 0  PHQ-9 Score 10   Subjective:   1. Other fatigue Kahlia admits to daytime somnolence and reports waking up still tired. Patient has a history of symptoms of daytime fatigue. Chariah generally gets  6-14  hours of sleep per night, and states that she has difficulty falling asleep. Snoring is not present. Apneic episodes are not present. Epworth Sleepiness Score is 7.   2. SOBOE (shortness of breath on exertion) Janett Billow notes increasing shortness of breath with exercising and seems to be worsening over time with weight gain. She notes getting out of breath sooner with activity than she used to. This has not gotten worse recently. Jasie denies shortness of breath at rest or orthopnea. EKG done in August 04, 2021, showing NSR of 84 bpm.  3. Vitamin D deficiency Initial lab low but Vit D was within normal limits on repeated labs.  She notes fatigue.  4. Insulin resistance A1c was within normal limits.  Insulin was elevated on only one lab in 2019.  Last blood sugar was within normal limits.  5. Anxiety and depression Sees Dr. Gerhard Perches with [facility].  On Propranolol, Xanax, Vraylar and Desvenlafaxine.  Assessment/Plan:   1. Other fatigue We will obtain labs today.  Madyson does feel that her weight is causing her energy to be lower than it should be. Fatigue may be related to obesity, depression or many other causes. Labs will be ordered, and in the meanwhile, Brittnae will focus on self care including making healthy food choices, increasing physical activity and focusing on stress reduction.   - Vitamin B12 - Folate - TSH - T4, free - T3  2. SOBOE (shortness  of breath on exertion) We will obtain labs today.  Elizabth does feel that she gets out of breath more easily that she used to when she exercises. Araceli's shortness of breath appears to be obesity related and exercise induced. She has agreed to work on weight loss and gradually increase exercise to treat her exercise induced shortness of breath. Will continue to monitor closely.   - CBC with Differential/Platelet - Lipid Panel With LDL/HDL Ratio  3. Vitamin D deficiency We will obtain labs today.  - VITAMIN D 25 Hydroxy (Vit-D Deficiency, Fractures)  4. Insulin resistance We will obtain labs today.  - Comprehensive metabolic panel - Hemoglobin A1c - Insulin, random  5. Anxiety and depression Continue with current medications without any changes in medication, dose or treatment.  6. Class 2 severe obesity with serious comorbidity and body mass index (BMI) of 39.0 to 39.9 in adult, unspecified obesity type (HCC) Elianis is currently in the action stage of change and her goal is to continue with weight loss efforts. I recommend Yanni begin the structured treatment plan as follows:  She has agreed to the Category 3 Plan and the [place].  Exercise goals: No exercise has been prescribed at this time.   Behavioral modification strategies: increasing lean protein intake.  She was informed of the importance of frequent follow-up visits to maximize her success with intensive lifestyle modifications for her multiple health conditions. She was informed we would discuss her lab results at her next visit unless there is a critical issue that needs to be addressed sooner. Isatu agreed to keep her next visit at the agreed upon time to discuss these results.  Objective:   Blood pressure 102/70, pulse 94, temperature 98 F (36.7 C), height '5\' 7"'$  (1.702 m), weight 251 lb (113.9 kg), SpO2 97 %. Body mass index is 39.31 kg/m.  EKG: Normal sinus rhythm, rate 84 bpm.  Indirect  Calorimeter completed today shows a VO2 of 273 ml and a REE of 1186.  Her calculated basal metabolic rate is 7169 thus her basal metabolic rate is worse than expected.  General: Cooperative, alert, well developed, in no acute distress. HEENT: Conjunctivae and lids unremarkable. Cardiovascular: Regular rhythm.  Lungs: Normal work of breathing. Neurologic: No focal deficits.   Lab Results  Component Value Date   CREATININE 0.77 01/19/2022   BUN 11 01/19/2022   NA 141 01/19/2022   K 4.3 01/19/2022   CL 105 01/19/2022   CO2 21 01/19/2022   Lab Results  Component Value Date   ALT 18 01/19/2022   AST 20 01/19/2022   ALKPHOS 78 01/19/2022   BILITOT 0.4 01/19/2022   Lab Results  Component Value Date   HGBA1C 5.3 01/19/2022   HGBA1C 5.3 05/22/2017   HGBA1C 5.1 10/28/2014   Lab Results  Component Value Date   INSULIN 23.1 01/19/2022   INSULIN 30.1 (H) 05/22/2017   Lab Results  Component Value Date   TSH 3.230 01/19/2022   Lab Results  Component Value Date   CHOL 174 01/19/2022   HDL 62 01/19/2022  LDLCALC 97 01/19/2022   TRIG 80 01/19/2022   CHOLHDL 3.1 05/22/2017   Lab Results  Component Value Date   WBC 12.8 (H) 01/19/2022   HGB 14.0 01/19/2022   HCT 42.7 01/19/2022   MCV 89 01/19/2022   PLT 261 01/19/2022   No results found for: "IRON", "TIBC", "FERRITIN"  Attestation Statements:   Reviewed by clinician on day of visit: allergies, medications, problem list, medical history, surgical history, family history, social history, and previous encounter notes.  Time spent on visit including pre-visit chart review and post-visit charting and care was 40 minutes.   I, [name], RMA am acting as transcriptionist for Coralie Common, MD.  I have reviewed the above documentation for accuracy and completeness, and I agree with the above. - Coralie Common, MD

## 2022-02-02 ENCOUNTER — Ambulatory Visit (INDEPENDENT_AMBULATORY_CARE_PROVIDER_SITE_OTHER): Payer: BC Managed Care – PPO | Admitting: Family Medicine

## 2022-02-02 ENCOUNTER — Encounter (INDEPENDENT_AMBULATORY_CARE_PROVIDER_SITE_OTHER): Payer: Self-pay | Admitting: Family Medicine

## 2022-02-02 VITALS — BP 107/72 | HR 95 | Temp 97.8°F | Ht 67.0 in | Wt 249.0 lb

## 2022-02-02 DIAGNOSIS — E88819 Insulin resistance, unspecified: Secondary | ICD-10-CM | POA: Diagnosis not present

## 2022-02-02 DIAGNOSIS — E669 Obesity, unspecified: Secondary | ICD-10-CM | POA: Diagnosis not present

## 2022-02-02 DIAGNOSIS — Z6839 Body mass index (BMI) 39.0-39.9, adult: Secondary | ICD-10-CM | POA: Diagnosis not present

## 2022-02-02 DIAGNOSIS — E559 Vitamin D deficiency, unspecified: Secondary | ICD-10-CM | POA: Diagnosis not present

## 2022-02-02 MED ORDER — VITAMIN D (ERGOCALCIFEROL) 1.25 MG (50000 UNIT) PO CAPS: 50000.0000 [IU] | ORAL_CAPSULE | ORAL | 0 refills | Status: DC

## 2022-02-14 NOTE — Progress Notes (Signed)
Chief Complaint:   OBESITY Summer Shields is here to discuss her progress with her obesity treatment plan along with follow-up of her obesity related diagnoses. Summer Shields is on the Category 3 Plan and the [place] and states she is following her eating plan approximately 30-40% of the time. Summer Shields states she is exercising 0 minutes 0 times per week.  Today's visit was #: 2 Starting weight: 251 lbs Starting date: 01/19/2022 Today's weight: 249 lbs Today's date: 02/02/2022 Total lbs lost to date: 2 lbs Total lbs lost since last in-office visit: 2  Interim History: Summer Shields has started meal planning and did have some prior engagements of eating out.  She did try to make more conscious choices while eating out.  States having food readily available is the hardest part.  Snack calories will be [employer] or almonds.  Subjective:   1. Insulin resistance A1c at 5.3, insulin at 23.1.  Not on meds for insulin resistance but on psychiatric meds for depression..  2. Vitamin D deficiency Vitamin D level of 35.4.  Notes fatigue.  Assessment/Plan:   1. Insulin resistance Pathophysiology of Insulin resistance, prediabetes, Diabetes discussed today.  Will follow-up at next appointment and subsequent appointment to discuss medications if necessary.  2. Vitamin D deficiency Start Vit D 50K IU once a week for 1 month with 0 refills.  Hold over-the-counter vitamin D.  -Start  Vitamin D, Ergocalciferol, (DRISDOL) 1.25 MG (50000 UNIT) CAPS capsule; Take 1 capsule (50,000 Units total) by mouth every 7 (seven) days.  Dispense: 4 capsule; Refill: 0  3. Obesity with current BMI of 39.1 Summer Shields is currently in the action stage of change. As such, her goal is to continue with weight loss efforts. She has agreed to the Category 3 Plan and the [place] +300.   Exercise goals: No exercise has been prescribed at this time.  Behavioral modification strategies: increasing lean protein intake, meal  planning and cooking strategies, keeping healthy foods in the home, and planning for success.  Summer Shields has agreed to follow-up with our clinic in 3 weeks. She was informed of the importance of frequent follow-up visits to maximize her success with intensive lifestyle modifications for her multiple health conditions.   Objective:   Blood pressure 107/72, pulse 95, temperature 97.8 F (36.6 C), height 5' 7"$  (1.702 m), weight 249 lb (112.9 kg), last menstrual period 02/02/2022, SpO2 97 %. Body mass index is 39 kg/m.  General: Cooperative, alert, well developed, in no acute distress. HEENT: Conjunctivae and lids unremarkable. Cardiovascular: Regular rhythm.  Lungs: Normal work of breathing. Neurologic: No focal deficits.   Lab Results  Component Value Date   CREATININE 0.77 01/19/2022   BUN 11 01/19/2022   NA 141 01/19/2022   K 4.3 01/19/2022   CL 105 01/19/2022   CO2 21 01/19/2022   Lab Results  Component Value Date   ALT 18 01/19/2022   AST 20 01/19/2022   ALKPHOS 78 01/19/2022   BILITOT 0.4 01/19/2022   Lab Results  Component Value Date   HGBA1C 5.3 01/19/2022   HGBA1C 5.3 05/22/2017   HGBA1C 5.1 10/28/2014   Lab Results  Component Value Date   INSULIN 23.1 01/19/2022   INSULIN 30.1 (H) 05/22/2017   Lab Results  Component Value Date   TSH 3.230 01/19/2022   Lab Results  Component Value Date   CHOL 174 01/19/2022   HDL 62 01/19/2022   LDLCALC 97 01/19/2022   TRIG 80 01/19/2022   CHOLHDL 3.1  05/22/2017   Lab Results  Component Value Date   VD25OH 35.4 01/19/2022   VD25OH 43.9 05/22/2017   VD25OH 52.5 10/11/2016   Lab Results  Component Value Date   WBC 12.8 (H) 01/19/2022   HGB 14.0 01/19/2022   HCT 42.7 01/19/2022   MCV 89 01/19/2022   PLT 261 01/19/2022   No results found for: "IRON", "TIBC", "FERRITIN"  Attestation Statements:   Reviewed by clinician on day of visit: allergies, medications, problem list, medical history, surgical history,  family history, social history, and previous encounter notes.  Time spent on visit including pre-visit chart review and post-visit care and charting was 45 minutes.   I, [name], RMA am acting as transcriptionist for Coralie Common, MD.  I have reviewed the above documentation for accuracy and completeness, and I agree with the above. - Coralie Common, MD

## 2022-02-23 ENCOUNTER — Encounter: Payer: Self-pay | Admitting: Certified Registered"

## 2022-02-23 ENCOUNTER — Other Ambulatory Visit: Payer: Self-pay | Admitting: Psychiatry

## 2022-02-23 ENCOUNTER — Encounter
Admission: RE | Admit: 2022-02-23 | Discharge: 2022-02-23 | Disposition: A | Discharge status: Home / Self Care | Payer: BC Managed Care – PPO | Source: Ambulatory Visit | Attending: Physician Assistant | Admitting: Physician Assistant

## 2022-02-23 ENCOUNTER — Ambulatory Visit: Payer: Self-pay | Admitting: Certified Registered"

## 2022-02-23 DIAGNOSIS — F418 Other specified anxiety disorders: Secondary | ICD-10-CM | POA: Diagnosis not present

## 2022-02-23 DIAGNOSIS — F339 Major depressive disorder, recurrent, unspecified: Secondary | ICD-10-CM | POA: Diagnosis not present

## 2022-02-23 DIAGNOSIS — E669 Obesity, unspecified: Secondary | ICD-10-CM | POA: Diagnosis not present

## 2022-02-23 DIAGNOSIS — R200 Anesthesia of skin: Secondary | ICD-10-CM

## 2022-02-23 DIAGNOSIS — Z6839 Body mass index (BMI) 39.0-39.9, adult: Secondary | ICD-10-CM | POA: Diagnosis not present

## 2022-02-23 DIAGNOSIS — F332 Major depressive disorder, recurrent severe without psychotic features: Secondary | ICD-10-CM

## 2022-02-23 LAB — POCT PREGNANCY, URINE: Preg Test, Ur: NEGATIVE

## 2022-02-23 MED ORDER — PROPOFOL 10 MG/ML IV BOLUS
INTRAVENOUS | Status: AC
  Filled 2022-02-23: qty 20

## 2022-02-23 MED ORDER — SODIUM CHLORIDE 0.9 % IV SOLN
500.0000 mL | Freq: Once | INTRAVENOUS | Status: AC
  Administered 2022-02-23: 500 mL via INTRAVENOUS

## 2022-02-23 MED ORDER — ONDANSETRON HCL 4 MG/2ML IJ SOLN
INTRAMUSCULAR | Status: AC
  Filled 2022-02-23: qty 2

## 2022-02-23 MED ORDER — GLYCOPYRROLATE 0.2 MG/ML IJ SOLN
0.2000 mg | Freq: Once | INTRAMUSCULAR | Status: AC
  Administered 2022-02-23: 0.2 mg via INTRAVENOUS

## 2022-02-23 MED ORDER — ONDANSETRON HCL 4 MG/2ML IJ SOLN: 4.0000 mg | Freq: Once | INTRAMUSCULAR | Status: DC | PRN

## 2022-02-23 MED ORDER — FENTANYL CITRATE (PF) 100 MCG/2ML IJ SOLN: 25.0000 ug | INTRAMUSCULAR | Status: DC | PRN

## 2022-02-23 MED ORDER — ONDANSETRON HCL 4 MG/2ML IJ SOLN
4.0000 mg | Freq: Once | INTRAMUSCULAR | Status: AC
  Administered 2022-02-23: 4 mg via INTRAVENOUS

## 2022-02-23 MED ORDER — SUCCINYLCHOLINE CHLORIDE 200 MG/10ML IV SOSY
PREFILLED_SYRINGE | INTRAVENOUS | Status: DC | PRN
  Administered 2022-02-23: 120 mg via INTRAVENOUS

## 2022-02-23 MED ORDER — GLYCOPYRROLATE 0.2 MG/ML IJ SOLN
INTRAMUSCULAR | Status: AC
  Filled 2022-02-23: qty 1

## 2022-02-23 MED ORDER — PROPOFOL 10 MG/ML IV BOLUS
INTRAVENOUS | Status: DC | PRN
  Administered 2022-02-23: 120 mg via INTRAVENOUS

## 2022-02-23 NOTE — Anesthesia Preprocedure Evaluation (Signed)
Anesthesia Evaluation  Patient identified by MRN, date of birth, ID band Patient awake    Reviewed: Allergy & Precautions, NPO status , Patient's Chart, lab work & pertinent test results  Airway Mallampati: II  TM Distance: >3 FB Neck ROM: Full    Dental  (+) Teeth Intact   Pulmonary neg pulmonary ROS   Pulmonary exam normal  + decreased breath sounds      Cardiovascular Exercise Tolerance: Good negative cardio ROS Normal cardiovascular exam Rhythm:Regular     Neuro/Psych  Headaches  Anxiety     negative neurological ROS  negative psych ROS   GI/Hepatic negative GI ROS, Neg liver ROS,,,  Endo/Other  negative endocrine ROS  Morbid obesity  Renal/GU negative Renal ROS  negative genitourinary   Musculoskeletal negative musculoskeletal ROS (+)    Abdominal  (+) + obese  Peds negative pediatric ROS (+)  Hematology negative hematology ROS (+)   Anesthesia Other Findings Past Medical History: No date: ADHD No date: Allergy No date: Anxiety No date: Depression No date: Environmental allergies No date: Fibroadenoma No date: Headache No date: Plantar fasciitis No date: Vitamin D deficiency  Past Surgical History: No date: BUNIONECTOMY; Bilateral     Comment:  04/09/2013 left, 06/30/2013 right,  No date: bunionectomy revision; Right     Comment:  08/2013 No date: FOOT SURGERY No date: open plantar fasciotomy; Right     Comment:  01/16/2014 No date: plantar topaz; Bilateral     Comment:  04/09/2013, 06/30/2013 No date: TONSILLECTOMY No date: WISDOM TOOTH EXTRACTION     Reproductive/Obstetrics negative OB ROS                             Anesthesia Physical Anesthesia Plan  ASA: 2  Anesthesia Plan: General   Post-op Pain Management:    Induction: Intravenous  PONV Risk Score and Plan: Propofol infusion and TIVA  Airway Management Planned: Natural Airway and Nasal  Cannula  Additional Equipment:   Intra-op Plan:   Post-operative Plan:   Informed Consent: I have reviewed the patients History and Physical, chart, labs and discussed the procedure including the risks, benefits and alternatives for the proposed anesthesia with the patient or authorized representative who has indicated his/her understanding and acceptance.     Dental Advisory Given  Plan Discussed with: CRNA and Surgeon  Anesthesia Plan Comments:        Anesthesia Quick Evaluation

## 2022-02-23 NOTE — H&P (Signed)
Summer Shields is an 37 y.o. female.   Chief Complaint: Patient feels mood has declined over the past month or so HPI: History of recurrent depression and anxiety previous good response to ECT  Past Medical History:  Diagnosis Date   ADHD    Allergy    Anxiety    Depression    Environmental allergies    Fibroadenoma    Headache    Plantar fasciitis    Vitamin D deficiency     Past Surgical History:  Procedure Laterality Date   BUNIONECTOMY Bilateral    04/09/2013 left, 06/30/2013 right,    bunionectomy revision Right    08/2013   FOOT SURGERY     open plantar fasciotomy Right    01/16/2014   plantar topaz Bilateral    04/09/2013, 06/30/2013   TONSILLECTOMY     WISDOM TOOTH EXTRACTION      Family History  Problem Relation Age of Onset   Anxiety disorder Mother    Cancer Mother    Anxiety disorder Father    Depression Father    Cancer Father    Alcoholism Father    ADD / ADHD Sister    Mental illness Brother        anxiety   Anxiety disorder Brother    Alcohol abuse Brother    Diabetes Maternal Grandfather    Hypertension Maternal Grandfather    Stroke Maternal Grandfather    Hyperlipidemia Maternal Grandfather    Alcohol abuse Maternal Grandfather    Cancer Paternal Grandmother        skin   Social History:  reports that she has never smoked. She has never used smokeless tobacco. She reports that she does not drink alcohol and does not use drugs.  Allergies:  Allergies  Allergen Reactions   Lamictal [Lamotrigine] Rash   Codeine Nausea And Vomiting   Tramadol Nausea And Vomiting   Amoxicillin Rash    Has patient had a PCN reaction causing immediate rash, facial/tongue/throat swelling, SOB or lightheadedness with hypotension: Yes Has patient had a PCN reaction causing severe rash involving mucus membranes or skin necrosis: No Has patient had a PCN reaction that required hospitalization: No Has patient had a PCN reaction occurring within the last 10 years:  No If all of the above answers are "NO", then may proceed with Cephalosporin use.     (Not in a hospital admission)   Results for orders placed or performed during the hospital encounter of 02/23/22 (from the past 48 hour(s))  Pregnancy, urine POC     Status: None   Collection Time: 02/23/22 11:07 AM  Result Value Ref Range   Preg Test, Ur NEGATIVE NEGATIVE    Comment:        THE SENSITIVITY OF THIS METHODOLOGY IS >24 mIU/mL    No results found.  Review of Systems  Constitutional: Negative.   HENT: Negative.    Eyes: Negative.   Respiratory: Negative.    Cardiovascular: Negative.   Gastrointestinal: Negative.   Musculoskeletal: Negative.   Skin: Negative.   Neurological: Negative.   Psychiatric/Behavioral:  Positive for dysphoric mood.     Blood pressure 100/70, pulse 94, temperature 98.7 F (37.1 C), temperature source Oral, resp. rate 18, height 5' 7"$  (1.702 m), weight 114.3 kg, last menstrual period 02/02/2022, SpO2 93 %. Physical Exam Vitals reviewed.  Constitutional:      Appearance: She is well-developed.  HENT:     Head: Normocephalic and atraumatic.  Eyes:     Conjunctiva/sclera:  Conjunctivae normal.     Pupils: Pupils are equal, round, and reactive to light.  Cardiovascular:     Heart sounds: Normal heart sounds.  Pulmonary:     Effort: Pulmonary effort is normal.  Abdominal:     Palpations: Abdomen is soft.  Musculoskeletal:        General: Normal range of motion.     Cervical back: Normal range of motion.  Skin:    General: Skin is warm and dry.  Neurological:     General: No focal deficit present.     Mental Status: She is alert.  Psychiatric:        Thought Content: Thought content normal.      Assessment/Plan Agree with treatment today and proposed to the patient that if she is feeling worse we do a few in a row.  Patient declined saying she prefers to just contact us for follow-up if needed  [name], MD 02/23/2022, 5:06 PM

## 2022-02-23 NOTE — Procedures (Signed)
ECT SERVICES Physician's Interval Evaluation & Treatment Note  Patient Identification: Summer Shields MRN:  [id number] Date of Evaluation:  02/23/2022 TX #: [id number]  MADRS:   MMSE:   P.E. Findings:  No change to physical exam  Psychiatric Interval Note:  Mood reported as down but no suicidal ideation or psychosis  Subjective:  Patient is a 37 y.o. female seen for evaluation for Electroconvulsive Therapy. Mild to moderate depression  Treatment Summary:   [x]$   Right Unilateral             []$  Bilateral   % Energy : 0.3 ms 60%   Impedance: 2150 ohms  Seizure Energy Index: 60,496 V squared  Postictal Suppression Index: No reading  Seizure Concordance Index: 99%  Medications  Pre Shock: Robinul 0.2 mg Zofran 4 mg propofol 120 mg succinylcholine 120 mg labetalol 10 mg  Post Shock:    Seizure Duration: 17 seconds EMG 46 seconds EEG   Comments: Patient will only follow-up as needed  Lungs:  [x]$   Clear to auscultation               []$  Other:   Heart:    [x]$   Regular rhythm             []$  irregular rhythm    [x]$   Previous H&P reviewed, patient examined and there are NO CHANGES                 []$   Previous H&P reviewed, patient examined and there are changes noted.   [name], MD 2/14/20245:10 PM

## 2022-02-23 NOTE — Anesthesia Postprocedure Evaluation (Signed)
Anesthesia Post Note  Patient: Summer Shields  Procedure(s) Performed: ECT TX  Patient location during evaluation: PACU Anesthesia Type: General Level of consciousness: awake Pain management: satisfactory to patient Vital Signs Assessment: post-procedure vital signs reviewed and stable Respiratory status: spontaneous breathing Cardiovascular status: stable Anesthetic complications: no   No notable events documented.   Last Vitals:  Vitals:   02/23/22 1400 02/23/22 1410  BP: 112/74   Pulse: 88 90  Resp: (!) 23 (!) 23  Temp: 37 C   SpO2: (!) 88% 96%    Last Pain:  Vitals:   02/23/22 1410  TempSrc:   PainSc: 0-No pain                 VAN STAVEREN,Nakesha Ebrahim

## 2022-02-23 NOTE — Transfer of Care (Signed)
Immediate Anesthesia Transfer of Care Note  Patient: Summer Shields  Procedure(s) Performed: ECT TX  Patient Location: PACU  Anesthesia Type:General  Level of Consciousness: awake, alert , and oriented  Airway & Oxygen Therapy: Patient Spontanous Breathing and Patient connected to nasal cannula oxygen  Post-op Assessment: Report given to RN and Post -op Vital signs reviewed and stable  Post vital signs: Reviewed and stable  Last Vitals:  Vitals Value Taken Time  BP 125/82 02/23/22 1358  Temp    Pulse 88 02/23/22 1400  Resp 26 02/23/22 1400  SpO2 87 % 02/23/22 1400  Vitals shown include unvalidated device data.  Last Pain:  Vitals:   02/23/22 1124  TempSrc:   PainSc: 0-No pain         Complications: No notable events documented.

## 2022-03-03 ENCOUNTER — Encounter (INDEPENDENT_AMBULATORY_CARE_PROVIDER_SITE_OTHER): Payer: Self-pay | Admitting: Family Medicine

## 2022-03-03 ENCOUNTER — Ambulatory Visit (INDEPENDENT_AMBULATORY_CARE_PROVIDER_SITE_OTHER): Payer: BC Managed Care – PPO | Admitting: Family Medicine

## 2022-03-03 VITALS — BP 116/74 | HR 85 | Temp 98.1°F | Ht 67.0 in | Wt 251.0 lb

## 2022-03-03 DIAGNOSIS — Z6839 Body mass index (BMI) 39.0-39.9, adult: Secondary | ICD-10-CM | POA: Diagnosis not present

## 2022-03-03 DIAGNOSIS — E669 Obesity, unspecified: Secondary | ICD-10-CM

## 2022-03-03 DIAGNOSIS — E88819 Insulin resistance, unspecified: Secondary | ICD-10-CM

## 2022-03-03 DIAGNOSIS — E559 Vitamin D deficiency, unspecified: Secondary | ICD-10-CM

## 2022-03-03 MED ORDER — VITAMIN D (ERGOCALCIFEROL) 1.25 MG (50000 UNIT) PO CAPS: 50000.0000 [IU] | ORAL_CAPSULE | ORAL | 0 refills | Status: DC

## 2022-03-15 NOTE — Progress Notes (Unsigned)
Chief Complaint:   OBESITY Summer Shields is here to discuss her progress with her obesity treatment plan along with follow-up of her obesity related diagnoses. [name] is on the Category 3 Plan or the [place] + 300 calories and states she is following her eating plan approximately 40% of the time. [name] states she is doing 0 minutes 0 times per week.  Today's visit was #: 3 Starting weight: 251 lbs Starting date: 01/19/2022 Today's weight: 251 lbs Today's date: 03/03/2022 Total lbs lost to date: 0 Total lbs lost since last in-office visit: 0  Interim History: Summer Shields notes she is doing better on her eating plan on her workdays, but she struggles more on 9 structured days.  She is open to looking at other eating plan options.  She feels she can keep a food journal but she is concerned that she may get obsessed about it.  Subjective:   1. Insulin resistance [name] is working on meal planning and prepping.  She is not used to cooking and she is building up her skills.  I discussed labs with the patient today.  2. Vitamin D deficiency Summer Shields is on vitamin D, and her level was not yet at goal.  She requested a refill today.  Assessment/Plan:   1. Insulin resistance Summer Shields agreed to start metformin 500 mg every morning with food, with no refills.  2. Vitamin D deficiency Summer Shields will continue prescription vitamin D, and we will refill for 1 month.  - Vitamin D, Ergocalciferol, (DRISDOL) 1.25 MG (50000 UNIT) CAPS capsule; Take 1 capsule (50,000 Units total) by mouth every 7 (seven) days.  Dispense: 4 capsule; Refill: 0  3. BMI 39.0-39.9,adult  4. Obesity, Beginning BMI 38.06 Summer Shields is currently in the action stage of change. As such, her goal is to continue with weight loss efforts. She has agreed to keeping a food journal and adhering to recommended goals of 1300-1500 calories and 85+ grams of protein daily.   We will continue to monitor her progress and watch for  detrimental behaviors.   Behavioral modification strategies: increasing lean protein intake.  Summer Shields has agreed to follow-up with our clinic in 2 to 3 weeks. She was informed of the importance of frequent follow-up visits to maximize her success with intensive lifestyle modifications for her multiple health conditions.   Objective:   Blood pressure 116/74, pulse 85, temperature 98.1 F (36.7 C), height '5\' 7"'$  (1.702 m), weight 251 lb (113.9 kg), last menstrual period 02/02/2022, SpO2 96 %. Body mass index is 39.31 kg/m.  Lab Results  Component Value Date   CREATININE 0.77 01/19/2022   BUN 11 01/19/2022   NA 141 01/19/2022   K 4.3 01/19/2022   CL 105 01/19/2022   CO2 21 01/19/2022   Lab Results  Component Value Date   ALT 18 01/19/2022   AST 20 01/19/2022   ALKPHOS 78 01/19/2022   BILITOT 0.4 01/19/2022   Lab Results  Component Value Date   HGBA1C 5.3 01/19/2022   HGBA1C 5.3 05/22/2017   HGBA1C 5.1 10/28/2014   Lab Results  Component Value Date   INSULIN 23.1 01/19/2022   INSULIN 30.1 (H) 05/22/2017   Lab Results  Component Value Date   TSH 3.230 01/19/2022   Lab Results  Component Value Date   CHOL 174 01/19/2022   HDL 62 01/19/2022   LDLCALC 97 01/19/2022   TRIG 80 01/19/2022   CHOLHDL 3.1 05/22/2017   Lab Results  Component Value Date   VD25OH  35.4 01/19/2022   VD25OH 43.9 05/22/2017   VD25OH 52.5 10/11/2016   Lab Results  Component Value Date   WBC 12.8 (H) 01/19/2022   HGB 14.0 01/19/2022   HCT 42.7 01/19/2022   MCV 89 01/19/2022   PLT 261 01/19/2022   No results found for: "IRON", "TIBC", "FERRITIN"  Attestation Statements:   Reviewed by clinician on day of visit: allergies, medications, problem list, medical history, surgical history, family history, social history, and previous encounter notes.  I have personally spent 42 minutes total time today in preparation, patient care, and documentation for this visit, including the following:  review of clinical lab tests; review of medical tests/procedures/services.   I, Trixie Dredge, am acting as transcriptionist for Dennard Nip, MD.  I have reviewed the above documentation for accuracy and completeness, and I agree with the above. -  Dennard Nip, MD

## 2022-03-16 ENCOUNTER — Other Ambulatory Visit (INDEPENDENT_AMBULATORY_CARE_PROVIDER_SITE_OTHER): Payer: Self-pay | Admitting: Family Medicine

## 2022-03-16 DIAGNOSIS — E88819 Insulin resistance, unspecified: Secondary | ICD-10-CM

## 2022-03-16 MED ORDER — METFORMIN HCL 500 MG PO TABS: 500.0000 mg | ORAL_TABLET | Freq: Every morning | ORAL | 0 refills | Status: DC

## 2022-03-30 DIAGNOSIS — Z3202 Encounter for pregnancy test, result negative: Secondary | ICD-10-CM | POA: Diagnosis not present

## 2022-03-30 DIAGNOSIS — Z113 Encounter for screening for infections with a predominantly sexual mode of transmission: Secondary | ICD-10-CM | POA: Diagnosis not present

## 2022-03-30 DIAGNOSIS — Z30430 Encounter for insertion of intrauterine contraceptive device: Secondary | ICD-10-CM | POA: Diagnosis not present

## 2022-04-05 ENCOUNTER — Ambulatory Visit (INDEPENDENT_AMBULATORY_CARE_PROVIDER_SITE_OTHER): Payer: BC Managed Care – PPO | Admitting: Family Medicine

## 2022-04-05 ENCOUNTER — Encounter (INDEPENDENT_AMBULATORY_CARE_PROVIDER_SITE_OTHER): Payer: Self-pay | Admitting: Family Medicine

## 2022-04-05 VITALS — BP 110/75 | HR 91 | Temp 98.2°F | Ht 67.0 in | Wt 249.0 lb

## 2022-04-05 DIAGNOSIS — E669 Obesity, unspecified: Secondary | ICD-10-CM

## 2022-04-05 DIAGNOSIS — E88819 Insulin resistance, unspecified: Secondary | ICD-10-CM | POA: Diagnosis not present

## 2022-04-05 DIAGNOSIS — Z6839 Body mass index (BMI) 39.0-39.9, adult: Secondary | ICD-10-CM | POA: Diagnosis not present

## 2022-04-05 DIAGNOSIS — E559 Vitamin D deficiency, unspecified: Secondary | ICD-10-CM

## 2022-04-05 MED ORDER — VITAMIN D (ERGOCALCIFEROL) 1.25 MG (50000 UNIT) PO CAPS: 50000.0000 [IU] | ORAL_CAPSULE | ORAL | 0 refills | Status: DC

## 2022-04-05 MED ORDER — METFORMIN HCL 500 MG PO TABS: 500.0000 mg | ORAL_TABLET | Freq: Every morning | ORAL | 0 refills | Status: DC

## 2022-04-05 NOTE — Progress Notes (Unsigned)
Chief Complaint:   OBESITY Summer Shields is here to discuss her progress with her obesity treatment plan along with follow-up of her obesity related diagnoses. Summer Shields is on keeping a food journal and adhering to recommended goals of 1300-1500 calories and 85+ grams of protein and states she is following her eating plan approximately 0% of the time. [name] states she is doing 0 minutes 0 times per week.  Today's visit was #: 4 Starting weight: 251 lbs Starting date: 01/19/2022 Today's weight: 249 lbs Today's date: 04/05/2022 Total lbs lost to date: 2 Total lbs lost since last in-office visit: 2  Interim History: Summer Shields has done better with her weight loss, but she has not been able to journal.  She has concerns that she will become obsessive with journaling so instead, she is working on portion control and Psychologist, counselling.  Subjective:   1. Insulin resistance Summer Shields recently started metformin, and she feels she is doing better with decreasing snacking.  2. Vitamin D deficiency Summer Shields is on vitamin D, and her last vitamin D level was below goal.  Assessment/Plan:   1. Insulin resistance Maggy will continue metformin, and we will refill for 1 month.  We will recheck labs in 1 to 2 months.  - metFORMIN (GLUCOPHAGE) 500 MG tablet; Take 1 tablet (500 mg total) by mouth every morning.  Dispense: 30 tablet; Refill: 0  2. Vitamin D deficiency Summer Shields will continue prescription vitamin D, and we will refill for 1 month.  We will recheck labs in 1 to 2 months.  - Vitamin D, Ergocalciferol, (DRISDOL) 1.25 MG (50000 UNIT) CAPS capsule; Take 1 capsule (50,000 Units total) by mouth every 7 (seven) days.  Dispense: 4 capsule; Refill: 0  3. BMI 39.0-39.9,adult  4. Obesity, Beginning BMI 38.06 Summer Shields is currently in the action stage of change. As such, her goal is to continue with weight loss efforts. She has agreed to keeping a food journal and adhering to recommended goals of  1300-1500 calories and 85+ grams of protein daily.   Summer Shields was encouraged to at least keep track of her protein to keep her RMR up.  She feels that she can do this.  "Real food" protein options were discussed.  Exercise goals: Chair yoga.   Behavioral modification strategies: increasing lean protein intake.  Summer Shields has agreed to follow-up with our clinic in 2 to 3 weeks. She was informed of the importance of frequent follow-up visits to maximize her success with intensive lifestyle modifications for her multiple health conditions.   Objective:   Blood pressure 110/75, pulse 91, temperature 98.2 F (36.8 C), height 5\' 7"  (1.702 m), weight 249 lb (112.9 kg), SpO2 94 %. Body mass index is 39 kg/m.  Lab Results  Component Value Date   CREATININE 0.77 01/19/2022   BUN 11 01/19/2022   NA 141 01/19/2022   K 4.3 01/19/2022   CL 105 01/19/2022   CO2 21 01/19/2022   Lab Results  Component Value Date   ALT 18 01/19/2022   AST 20 01/19/2022   ALKPHOS 78 01/19/2022   BILITOT 0.4 01/19/2022   Lab Results  Component Value Date   HGBA1C 5.3 01/19/2022   HGBA1C 5.3 05/22/2017   HGBA1C 5.1 10/28/2014   Lab Results  Component Value Date   INSULIN 23.1 01/19/2022   INSULIN 30.1 (H) 05/22/2017   Lab Results  Component Value Date   TSH 3.230 01/19/2022   Lab Results  Component Value Date   CHOL 174  01/19/2022   HDL 62 01/19/2022   LDLCALC 97 01/19/2022   TRIG 80 01/19/2022   CHOLHDL 3.1 05/22/2017   Lab Results  Component Value Date   VD25OH 35.4 01/19/2022   VD25OH 43.9 05/22/2017   VD25OH 52.5 10/11/2016   Lab Results  Component Value Date   WBC 12.8 (H) 01/19/2022   HGB 14.0 01/19/2022   HCT 42.7 01/19/2022   MCV 89 01/19/2022   PLT 261 01/19/2022   No results found for: "IRON", "TIBC", "FERRITIN"  Attestation Statements:   Reviewed by clinician on day of visit: allergies, medications, problem list, medical history, surgical history, family history, social  history, and previous encounter notes.   I, Trixie Dredge, am acting as transcriptionist for Dennard Nip, MD.  I have reviewed the above documentation for accuracy and completeness, and I agree with the above. -  Dennard Nip, MD

## 2022-04-08 ENCOUNTER — Other Ambulatory Visit (INDEPENDENT_AMBULATORY_CARE_PROVIDER_SITE_OTHER): Payer: Self-pay | Admitting: Family Medicine

## 2022-04-08 DIAGNOSIS — E88819 Insulin resistance, unspecified: Secondary | ICD-10-CM

## 2022-04-19 DIAGNOSIS — F331 Major depressive disorder, recurrent, moderate: Secondary | ICD-10-CM | POA: Diagnosis not present

## 2022-04-29 DIAGNOSIS — G4710 Hypersomnia, unspecified: Secondary | ICD-10-CM | POA: Diagnosis not present

## 2022-04-29 DIAGNOSIS — F322 Major depressive disorder, single episode, severe without psychotic features: Secondary | ICD-10-CM | POA: Diagnosis not present

## 2022-04-29 DIAGNOSIS — Z01419 Encounter for gynecological examination (general) (routine) without abnormal findings: Secondary | ICD-10-CM | POA: Diagnosis not present

## 2022-04-29 DIAGNOSIS — Z0000 Encounter for general adult medical examination without abnormal findings: Secondary | ICD-10-CM | POA: Diagnosis not present

## 2022-04-29 DIAGNOSIS — Z1330 Encounter for screening examination for mental health and behavioral disorders, unspecified: Secondary | ICD-10-CM | POA: Diagnosis not present

## 2022-04-29 DIAGNOSIS — F411 Generalized anxiety disorder: Secondary | ICD-10-CM | POA: Diagnosis not present

## 2022-05-04 ENCOUNTER — Other Ambulatory Visit (INDEPENDENT_AMBULATORY_CARE_PROVIDER_SITE_OTHER): Payer: Self-pay | Admitting: Family Medicine

## 2022-05-04 ENCOUNTER — Encounter (INDEPENDENT_AMBULATORY_CARE_PROVIDER_SITE_OTHER): Payer: Self-pay | Admitting: Family Medicine

## 2022-05-04 ENCOUNTER — Ambulatory Visit (INDEPENDENT_AMBULATORY_CARE_PROVIDER_SITE_OTHER): Payer: BC Managed Care – PPO | Admitting: Family Medicine

## 2022-05-04 VITALS — BP 115/82 | HR 89 | Temp 98.2°F | Ht 67.0 in | Wt 244.0 lb

## 2022-05-04 DIAGNOSIS — F32A Depression, unspecified: Secondary | ICD-10-CM | POA: Diagnosis not present

## 2022-05-04 DIAGNOSIS — E88819 Insulin resistance, unspecified: Secondary | ICD-10-CM | POA: Diagnosis not present

## 2022-05-04 DIAGNOSIS — Z6838 Body mass index (BMI) 38.0-38.9, adult: Secondary | ICD-10-CM

## 2022-05-04 DIAGNOSIS — F419 Anxiety disorder, unspecified: Secondary | ICD-10-CM | POA: Diagnosis not present

## 2022-05-04 DIAGNOSIS — E559 Vitamin D deficiency, unspecified: Secondary | ICD-10-CM | POA: Diagnosis not present

## 2022-05-04 DIAGNOSIS — E669 Obesity, unspecified: Secondary | ICD-10-CM

## 2022-05-04 MED ORDER — VITAMIN D (ERGOCALCIFEROL) 1.25 MG (50000 UNIT) PO CAPS: 50000.0000 [IU] | ORAL_CAPSULE | ORAL | 0 refills | Status: AC

## 2022-05-04 MED ORDER — METFORMIN HCL 500 MG PO TABS: 500.0000 mg | ORAL_TABLET | Freq: Every morning | ORAL | 0 refills | Status: AC

## 2022-05-04 NOTE — Progress Notes (Unsigned)
Chief Complaint:   OBESITY Summer Shields is here to discuss her progress with her obesity treatment plan along with follow-up of her obesity related diagnoses. Summer Shields is on keeping a food journal and adhering to recommended goals of 1300-1500 calories and 85+ grams of protein and states she is following her eating plan approximately 25% of the time. Summer Shields states she is doing 0 minutes 0 times per week.  Today's visit was #: 5 Starting weight: 251 lbs Starting date: 01/19/2022 Today's weight: 244 lbs Today's date: 05/04/2022 Total lbs lost to date: 7 Total lbs lost since last in-office visit: 5  Interim History: Summer Shields has done well with her weight loss. She is working on increasing her protein, but not yet at goal but she is getting close. She is working on decreasing emotional eating behaviors.   Subjective:   1. Insulin resistance Summer Shields continues to work on her diet and weight loss.   2. Vitamin D deficiency Summer Shields is on Vitamin D prescription, and she denies nausea, vomiting, or muscle weakness.   3. Anxiety and depression Summer Shields has been off Vraylar due to finances and medications costing $400 per month. She notes some skipping meals and some emotional eating behavior. She follows with her Psychiatrist.   Assessment/Plan:   1. Insulin resistance Mirra will continue metformin, and we will refill for 1 month.   - metFORMIN (GLUCOPHAGE) 500 MG tablet; Take 1 tablet (500 mg total) by mouth every morning.  Dispense: 30 tablet; Refill: 0  2. Vitamin D deficiency Summer Shields will continue prescription Vitamin D, and we will refill for 1 month.   - Vitamin D, Ergocalciferol, (DRISDOL) 1.25 MG (50000 UNIT) CAPS capsule; Take 1 capsule (50,000 Units total) by mouth every 7 (seven) days.  Dispense: 4 capsule; Refill: 0  3. Anxiety and depression Summer Shields is to work on getting into a routine to help with meal planning and to decrease emotional eating behavior. We will continue to  follow closely.   4. BMI 38.0-38.9,adult  5. Obesity, Beginning BMI 38.06 Summer Shields is currently in the action stage of change. As such, her goal is to continue with weight loss efforts. She has agreed to keeping a food journal and adhering to recommended goals of 1300-1500 calories and 85+ grams of protein daily.    Behavioral modification strategies: keeping healthy foods in the home and emotional eating strategies.  Summer Shields has agreed to follow-up with our clinic in 4 weeks. She was informed of the importance of frequent follow-up visits to maximize her success with intensive lifestyle modifications for her multiple health conditions.   Objective:   Blood pressure 115/82, pulse 89, temperature 98.2 F (36.8 C), height  (1.702 m), weight 244 lb (110.7 kg), SpO2 96 %. Body mass index is 38.22 kg/m.  Lab Results  Component Value Date   CREATININE 0.77 01/19/2022   BUN 11 01/19/2022   NA 141 01/19/2022   K 4.3 01/19/2022   CL 105 01/19/2022   CO2 21 01/19/2022   Lab Results  Component Value Date   ALT 18 01/19/2022   AST 20 01/19/2022   ALKPHOS 78 01/19/2022   BILITOT 0.4 01/19/2022   Lab Results  Component Value Date   HGBA1C 5.3 01/19/2022   HGBA1C 5.3 05/22/2017   HGBA1C 5.1 10/28/2014   Lab Results  Component Value Date   INSULIN 23.1 01/19/2022   INSULIN 30.1 (H) 05/22/2017   Lab Results  Component Value Date   TSH 3.230 01/19/2022   Lab  Results  Component Value Date   CHOL 174 01/19/2022   HDL 62 01/19/2022   LDLCALC 97 01/19/2022   TRIG 80 01/19/2022   CHOLHDL 3.1 05/22/2017   Lab Results  Component Value Date   VD25OH 35.4 01/19/2022   VD25OH 43.9 05/22/2017   VD25OH 52.5 10/11/2016   Lab Results  Component Value Date   WBC 12.8 (H) 01/19/2022   HGB 14.0 01/19/2022   HCT 42.7 01/19/2022   MCV 89 01/19/2022   PLT 261 01/19/2022   No results found for: "IRON", "TIBC", "FERRITIN"  Attestation Statements:   Reviewed by clinician on day  of visit: allergies, medications, problem list, medical history, surgical history, family history, social history, and previous encounter notes.   I, Burt Knack, am acting as transcriptionist for Quillian Quince, MD.  I have reviewed the above documentation for accuracy and completeness, and I agree with the above. -  Quillian Quince, MD

## 2022-05-16 ENCOUNTER — Encounter (HOSPITAL_COMMUNITY): Payer: Self-pay

## 2022-06-15 ENCOUNTER — Ambulatory Visit (INDEPENDENT_AMBULATORY_CARE_PROVIDER_SITE_OTHER): Payer: BC Managed Care – PPO | Admitting: Family Medicine

## 2022-11-08 ENCOUNTER — Encounter (INDEPENDENT_AMBULATORY_CARE_PROVIDER_SITE_OTHER): Payer: Self-pay | Admitting: Family Medicine

## 2023-08-03 DIAGNOSIS — F902 Attention-deficit hyperactivity disorder, combined type: Secondary | ICD-10-CM | POA: Diagnosis not present

## 2023-08-03 DIAGNOSIS — F4323 Adjustment disorder with mixed anxiety and depressed mood: Secondary | ICD-10-CM | POA: Diagnosis not present

## 2023-08-17 DIAGNOSIS — F4323 Adjustment disorder with mixed anxiety and depressed mood: Secondary | ICD-10-CM | POA: Diagnosis not present

## 2023-08-17 DIAGNOSIS — F902 Attention-deficit hyperactivity disorder, combined type: Secondary | ICD-10-CM | POA: Diagnosis not present

## 2023-10-25 DIAGNOSIS — F411 Generalized anxiety disorder: Secondary | ICD-10-CM | POA: Diagnosis not present

## 2023-10-25 DIAGNOSIS — F322 Major depressive disorder, single episode, severe without psychotic features: Secondary | ICD-10-CM | POA: Diagnosis not present

## 2023-10-25 DIAGNOSIS — Z1330 Encounter for screening examination for mental health and behavioral disorders, unspecified: Secondary | ICD-10-CM | POA: Diagnosis not present

## 2023-10-25 DIAGNOSIS — Z23 Encounter for immunization: Secondary | ICD-10-CM | POA: Diagnosis not present

## 2023-10-25 DIAGNOSIS — F3281 Premenstrual dysphoric disorder: Secondary | ICD-10-CM | POA: Diagnosis not present

## 2023-10-25 DIAGNOSIS — N946 Dysmenorrhea, unspecified: Secondary | ICD-10-CM | POA: Diagnosis not present

## 2023-11-06 DIAGNOSIS — F322 Major depressive disorder, single episode, severe without psychotic features: Secondary | ICD-10-CM | POA: Diagnosis not present

## 2023-11-06 DIAGNOSIS — F411 Generalized anxiety disorder: Secondary | ICD-10-CM | POA: Diagnosis not present
